# Patient Record
Sex: Female | Born: 1965 | Race: White | Hispanic: No | Marital: Married | Smoking: Former smoker
Health system: Southern US, Community
[De-identification: ages and names within clinical notes are randomized; demographics above are authoritative.]

## PROBLEM LIST (undated history)

## (undated) DIAGNOSIS — I10 Essential (primary) hypertension: Secondary | ICD-10-CM

## (undated) DIAGNOSIS — F319 Bipolar disorder, unspecified: Secondary | ICD-10-CM

## (undated) DIAGNOSIS — K635 Polyp of colon: Secondary | ICD-10-CM

## (undated) DIAGNOSIS — F329 Major depressive disorder, single episode, unspecified: Secondary | ICD-10-CM

## (undated) DIAGNOSIS — K219 Gastro-esophageal reflux disease without esophagitis: Secondary | ICD-10-CM

## (undated) DIAGNOSIS — D649 Anemia, unspecified: Secondary | ICD-10-CM

## (undated) DIAGNOSIS — F32A Depression, unspecified: Secondary | ICD-10-CM

## (undated) DIAGNOSIS — M1990 Unspecified osteoarthritis, unspecified site: Secondary | ICD-10-CM

## (undated) DIAGNOSIS — G4730 Sleep apnea, unspecified: Secondary | ICD-10-CM

## (undated) HISTORY — DX: Polyp of colon: K63.5

## (undated) HISTORY — DX: Major depressive disorder, single episode, unspecified: F32.9

## (undated) HISTORY — DX: Sleep apnea, unspecified: G47.30

## (undated) HISTORY — DX: Bipolar disorder, unspecified: F31.9

## (undated) HISTORY — DX: Gastro-esophageal reflux disease without esophagitis: K21.9

## (undated) HISTORY — DX: Essential (primary) hypertension: I10

## (undated) HISTORY — DX: Anemia, unspecified: D64.9

## (undated) HISTORY — DX: Morbid (severe) obesity due to excess calories: E66.01

## (undated) HISTORY — DX: Depression, unspecified: F32.A

## (undated) HISTORY — DX: Unspecified osteoarthritis, unspecified site: M19.90

---

## 1998-07-12 ENCOUNTER — Other Ambulatory Visit: Admission: RE | Admit: 1998-07-12 | Discharge: 1998-07-12 | Payer: Self-pay | Admitting: Gynecology

## 1998-07-27 ENCOUNTER — Encounter: Admission: RE | Admit: 1998-07-27 | Discharge: 1998-10-25 | Payer: Self-pay | Admitting: Gynecology

## 1998-11-08 ENCOUNTER — Encounter: Admission: RE | Admit: 1998-11-08 | Discharge: 1999-02-06 | Payer: Self-pay | Admitting: Gynecology

## 1998-12-05 ENCOUNTER — Inpatient Hospital Stay (HOSPITAL_COMMUNITY): Admission: AD | Admit: 1998-12-05 | Discharge: 1998-12-05 | Payer: Self-pay | Admitting: Obstetrics and Gynecology

## 1998-12-06 ENCOUNTER — Inpatient Hospital Stay (HOSPITAL_COMMUNITY): Admission: AD | Admit: 1998-12-06 | Discharge: 1998-12-06 | Payer: Self-pay | Admitting: Gynecology

## 1999-01-21 ENCOUNTER — Inpatient Hospital Stay (HOSPITAL_COMMUNITY): Admission: AD | Admit: 1999-01-21 | Discharge: 1999-01-21 | Payer: Self-pay | Admitting: Obstetrics and Gynecology

## 1999-01-25 ENCOUNTER — Inpatient Hospital Stay (HOSPITAL_COMMUNITY): Admission: AD | Admit: 1999-01-25 | Discharge: 1999-01-29 | Payer: Self-pay | Admitting: Gynecology

## 2000-05-03 ENCOUNTER — Other Ambulatory Visit: Admission: RE | Admit: 2000-05-03 | Discharge: 2000-05-03 | Payer: Self-pay | Admitting: Obstetrics and Gynecology

## 2000-05-10 ENCOUNTER — Encounter: Payer: Self-pay | Admitting: Family Medicine

## 2000-05-10 ENCOUNTER — Ambulatory Visit (HOSPITAL_COMMUNITY): Admission: RE | Admit: 2000-05-10 | Discharge: 2000-05-10 | Payer: Self-pay | Admitting: Family Medicine

## 2000-11-16 ENCOUNTER — Emergency Department (HOSPITAL_COMMUNITY): Admission: EM | Admit: 2000-11-16 | Discharge: 2000-11-17 | Payer: Self-pay | Admitting: Emergency Medicine

## 2001-07-08 ENCOUNTER — Other Ambulatory Visit: Admission: RE | Admit: 2001-07-08 | Discharge: 2001-07-08 | Payer: Self-pay | Admitting: Obstetrics and Gynecology

## 2001-12-26 ENCOUNTER — Encounter: Payer: Self-pay | Admitting: Obstetrics and Gynecology

## 2001-12-26 ENCOUNTER — Ambulatory Visit (HOSPITAL_COMMUNITY): Admission: RE | Admit: 2001-12-26 | Discharge: 2001-12-26 | Payer: Self-pay | Admitting: Obstetrics and Gynecology

## 2002-08-02 ENCOUNTER — Inpatient Hospital Stay (HOSPITAL_COMMUNITY): Admission: AD | Admit: 2002-08-02 | Discharge: 2002-08-02 | Payer: Self-pay | Admitting: Gynecology

## 2002-08-04 ENCOUNTER — Encounter: Admission: RE | Admit: 2002-08-04 | Discharge: 2002-08-04 | Payer: Self-pay | Admitting: Gynecology

## 2002-08-13 ENCOUNTER — Observation Stay (HOSPITAL_COMMUNITY): Admission: AD | Admit: 2002-08-13 | Discharge: 2002-08-14 | Payer: Self-pay | Admitting: *Deleted

## 2002-09-30 ENCOUNTER — Inpatient Hospital Stay (HOSPITAL_COMMUNITY): Admission: AD | Admit: 2002-09-30 | Discharge: 2002-10-04 | Payer: Self-pay | Admitting: Gynecology

## 2002-09-30 ENCOUNTER — Encounter (INDEPENDENT_AMBULATORY_CARE_PROVIDER_SITE_OTHER): Payer: Self-pay

## 2002-10-05 ENCOUNTER — Encounter: Admission: RE | Admit: 2002-10-05 | Discharge: 2002-11-04 | Payer: Self-pay | Admitting: Gynecology

## 2002-10-15 ENCOUNTER — Ambulatory Visit (HOSPITAL_COMMUNITY): Admission: RE | Admit: 2002-10-15 | Discharge: 2002-10-15 | Payer: Self-pay | Admitting: Gynecology

## 2002-11-12 ENCOUNTER — Other Ambulatory Visit: Admission: RE | Admit: 2002-11-12 | Discharge: 2002-11-12 | Payer: Self-pay | Admitting: Gynecology

## 2002-12-05 ENCOUNTER — Encounter: Admission: RE | Admit: 2002-12-05 | Discharge: 2003-01-04 | Payer: Self-pay | Admitting: Gynecology

## 2003-01-05 ENCOUNTER — Encounter: Admission: RE | Admit: 2003-01-05 | Discharge: 2003-02-04 | Payer: Self-pay | Admitting: Gynecology

## 2003-05-17 ENCOUNTER — Encounter: Admission: RE | Admit: 2003-05-17 | Discharge: 2003-08-15 | Payer: Self-pay | Admitting: Internal Medicine

## 2004-01-19 ENCOUNTER — Other Ambulatory Visit: Admission: RE | Admit: 2004-01-19 | Discharge: 2004-01-19 | Payer: Self-pay | Admitting: Gynecology

## 2004-01-31 ENCOUNTER — Ambulatory Visit (HOSPITAL_COMMUNITY): Admission: RE | Admit: 2004-01-31 | Discharge: 2004-01-31 | Payer: Self-pay | Admitting: Gynecology

## 2004-09-29 ENCOUNTER — Ambulatory Visit: Payer: Self-pay | Admitting: Gastroenterology

## 2004-10-04 ENCOUNTER — Ambulatory Visit: Payer: Self-pay | Admitting: Gastroenterology

## 2004-10-13 ENCOUNTER — Ambulatory Visit: Payer: Self-pay | Admitting: Gastroenterology

## 2005-02-12 ENCOUNTER — Other Ambulatory Visit: Admission: RE | Admit: 2005-02-12 | Discharge: 2005-02-12 | Payer: Self-pay | Admitting: Gynecology

## 2005-02-13 ENCOUNTER — Encounter (INDEPENDENT_AMBULATORY_CARE_PROVIDER_SITE_OTHER): Payer: Self-pay | Admitting: *Deleted

## 2005-02-13 ENCOUNTER — Ambulatory Visit (HOSPITAL_COMMUNITY): Admission: RE | Admit: 2005-02-13 | Discharge: 2005-02-13 | Payer: Self-pay | Admitting: General Surgery

## 2005-02-13 ENCOUNTER — Ambulatory Visit (HOSPITAL_BASED_OUTPATIENT_CLINIC_OR_DEPARTMENT_OTHER): Admission: RE | Admit: 2005-02-13 | Discharge: 2005-02-13 | Payer: Self-pay | Admitting: General Surgery

## 2005-02-28 ENCOUNTER — Ambulatory Visit (HOSPITAL_COMMUNITY): Admission: RE | Admit: 2005-02-28 | Discharge: 2005-02-28 | Payer: Self-pay | Admitting: Gynecology

## 2006-04-23 ENCOUNTER — Other Ambulatory Visit: Admission: RE | Admit: 2006-04-23 | Discharge: 2006-04-23 | Payer: Self-pay | Admitting: Gynecology

## 2006-05-15 ENCOUNTER — Ambulatory Visit (HOSPITAL_COMMUNITY): Admission: RE | Admit: 2006-05-15 | Discharge: 2006-05-15 | Payer: Self-pay | Admitting: Gynecology

## 2007-07-08 ENCOUNTER — Ambulatory Visit: Payer: Self-pay | Admitting: Gastroenterology

## 2007-07-08 LAB — CONVERTED CEMR LAB
ALT: 26 units/L (ref 0–35)
AST: 24 units/L (ref 0–37)
Albumin: 3.6 g/dL (ref 3.5–5.2)
Alkaline Phosphatase: 63 units/L (ref 39–117)
BUN: 10 mg/dL (ref 6–23)
Basophils Absolute: 0.1 10*3/uL (ref 0.0–0.1)
Basophils Relative: 0.8 % (ref 0.0–1.0)
Bilirubin, Direct: 0.1 mg/dL (ref 0.0–0.3)
CO2: 29 meq/L (ref 19–32)
Calcium: 9.2 mg/dL (ref 8.4–10.5)
Chloride: 106 meq/L (ref 96–112)
Creatinine, Ser: 0.7 mg/dL (ref 0.4–1.2)
Eosinophils Absolute: 0.4 10*3/uL (ref 0.0–0.6)
Eosinophils Relative: 3.6 % (ref 0.0–5.0)
Ferritin: 19.1 ng/mL (ref 10.0–291.0)
Folate: 13.9 ng/mL
GFR calc Af Amer: 118 mL/min
GFR calc non Af Amer: 98 mL/min
Glucose, Bld: 90 mg/dL (ref 70–99)
HCT: 36.4 % (ref 36.0–46.0)
Hemoglobin: 12.6 g/dL (ref 12.0–15.0)
Iron: 52 ug/dL (ref 42–145)
Lymphocytes Relative: 18.5 % (ref 12.0–46.0)
MCHC: 34.6 g/dL (ref 30.0–36.0)
MCV: 87.6 fL (ref 78.0–100.0)
Monocytes Absolute: 1.2 10*3/uL — ABNORMAL HIGH (ref 0.2–0.7)
Monocytes Relative: 10.8 % (ref 3.0–11.0)
Neutro Abs: 7.1 10*3/uL (ref 1.4–7.7)
Neutrophils Relative %: 66.3 % (ref 43.0–77.0)
Platelets: 333 10*3/uL (ref 150–400)
Potassium: 4.1 meq/L (ref 3.5–5.1)
RBC: 4.15 M/uL (ref 3.87–5.11)
RDW: 13.2 % (ref 11.5–14.6)
Saturation Ratios: 12.9 % — ABNORMAL LOW (ref 20.0–50.0)
Sodium: 139 meq/L (ref 135–145)
TSH: 5.08 microintl units/mL (ref 0.35–5.50)
Total Bilirubin: 0.5 mg/dL (ref 0.3–1.2)
Total Protein: 6.8 g/dL (ref 6.0–8.3)
Transferrin: 287.9 mg/dL (ref >=212.0)
Vitamin B-12: 387 pg/mL (ref 211–911)
WBC: 10.8 10*3/uL — ABNORMAL HIGH (ref 4.5–10.5)

## 2007-08-06 ENCOUNTER — Ambulatory Visit: Payer: Self-pay | Admitting: Gastroenterology

## 2007-09-24 ENCOUNTER — Other Ambulatory Visit: Admission: RE | Admit: 2007-09-24 | Discharge: 2007-09-24 | Payer: Self-pay | Admitting: Gynecology

## 2007-10-16 ENCOUNTER — Ambulatory Visit (HOSPITAL_COMMUNITY): Admission: RE | Admit: 2007-10-16 | Discharge: 2007-10-16 | Payer: Self-pay | Admitting: Gynecology

## 2008-03-01 DIAGNOSIS — K649 Unspecified hemorrhoids: Secondary | ICD-10-CM | POA: Insufficient documentation

## 2008-03-01 DIAGNOSIS — F329 Major depressive disorder, single episode, unspecified: Secondary | ICD-10-CM | POA: Insufficient documentation

## 2008-03-01 DIAGNOSIS — D126 Benign neoplasm of colon, unspecified: Secondary | ICD-10-CM | POA: Insufficient documentation

## 2008-10-25 ENCOUNTER — Ambulatory Visit (HOSPITAL_COMMUNITY): Admission: RE | Admit: 2008-10-25 | Discharge: 2008-10-25 | Payer: Self-pay | Admitting: Gynecology

## 2008-10-26 ENCOUNTER — Other Ambulatory Visit: Admission: RE | Admit: 2008-10-26 | Discharge: 2008-10-26 | Payer: Self-pay | Admitting: Gynecology

## 2008-10-26 ENCOUNTER — Encounter: Payer: Self-pay | Admitting: Gynecology

## 2008-10-26 ENCOUNTER — Ambulatory Visit: Payer: Self-pay | Admitting: Gynecology

## 2009-05-05 ENCOUNTER — Emergency Department (HOSPITAL_COMMUNITY): Admission: EM | Admit: 2009-05-05 | Discharge: 2009-05-05 | Payer: Self-pay | Admitting: Emergency Medicine

## 2009-06-08 ENCOUNTER — Ambulatory Visit: Payer: Self-pay | Admitting: Gynecology

## 2009-08-18 ENCOUNTER — Ambulatory Visit: Payer: Self-pay | Admitting: Gynecology

## 2009-08-18 ENCOUNTER — Encounter: Payer: Self-pay | Admitting: Gynecology

## 2009-08-24 ENCOUNTER — Ambulatory Visit: Payer: Self-pay | Admitting: Gynecology

## 2009-08-25 ENCOUNTER — Ambulatory Visit: Payer: Self-pay | Admitting: Gynecology

## 2009-08-25 HISTORY — PX: ENDOMETRIAL ABLATION: SHX621

## 2009-09-09 ENCOUNTER — Ambulatory Visit: Payer: Self-pay | Admitting: Gynecology

## 2010-01-02 ENCOUNTER — Other Ambulatory Visit: Admission: RE | Admit: 2010-01-02 | Discharge: 2010-01-02 | Payer: Self-pay | Admitting: Gynecology

## 2010-01-02 ENCOUNTER — Ambulatory Visit: Payer: Self-pay | Admitting: Gynecology

## 2010-03-21 ENCOUNTER — Ambulatory Visit: Payer: Self-pay | Admitting: Gynecology

## 2010-03-21 ENCOUNTER — Inpatient Hospital Stay (HOSPITAL_COMMUNITY): Admission: RE | Admit: 2010-03-21 | Discharge: 2010-03-23 | Payer: Self-pay | Admitting: Gynecology

## 2010-03-21 ENCOUNTER — Encounter: Payer: Self-pay | Admitting: Gynecology

## 2010-03-21 HISTORY — PX: ABDOMINAL HYSTERECTOMY: SHX81

## 2010-03-27 ENCOUNTER — Ambulatory Visit: Payer: Self-pay | Admitting: Gynecology

## 2010-04-04 ENCOUNTER — Ambulatory Visit: Payer: Self-pay | Admitting: Gynecology

## 2010-05-03 ENCOUNTER — Ambulatory Visit: Payer: Self-pay | Admitting: Gynecology

## 2010-09-11 ENCOUNTER — Ambulatory Visit (HOSPITAL_COMMUNITY): Admission: RE | Admit: 2010-09-11 | Discharge: 2010-09-11 | Payer: Self-pay | Admitting: Gynecology

## 2010-12-26 NOTE — Procedures (Signed)
Summary: Gastroenterology colon  Gastroenterology colon   Imported By: Donneta Romberg 03/02/2008 14:38:49  _____________________________________________________________________  External Attachment:    Type:   Image     Comment:   External Document

## 2011-02-13 LAB — CBC
HCT: 32.3 % — ABNORMAL LOW (ref 36.0–46.0)
HCT: 37.3 % (ref 36.0–46.0)
Hemoglobin: 10.5 g/dL — ABNORMAL LOW (ref 12.0–15.0)
Hemoglobin: 12 g/dL (ref 12.0–15.0)
MCHC: 32.1 g/dL (ref 30.0–36.0)
MCHC: 32.5 g/dL (ref 30.0–36.0)
MCV: 74 fL — ABNORMAL LOW (ref 78.0–100.0)
MCV: 75.1 fL — ABNORMAL LOW (ref 78.0–100.0)
Platelets: 309 10*3/uL (ref 150–400)
Platelets: 342 10*3/uL (ref 150–400)
RBC: 4.3 MIL/uL (ref 3.87–5.11)
RBC: 5.03 MIL/uL (ref 3.87–5.11)
RDW: 17.9 % — ABNORMAL HIGH (ref 11.5–15.5)
RDW: 18 % — ABNORMAL HIGH (ref 11.5–15.5)
WBC: 14.8 10*3/uL — ABNORMAL HIGH (ref 4.0–10.5)
WBC: 8.4 10*3/uL (ref 4.0–10.5)

## 2011-02-13 LAB — URINALYSIS, ROUTINE W REFLEX MICROSCOPIC
Bilirubin Urine: NEGATIVE
Glucose, UA: NEGATIVE mg/dL
Hgb urine dipstick: NEGATIVE
Ketones, ur: NEGATIVE mg/dL
Nitrite: NEGATIVE
Protein, ur: NEGATIVE mg/dL
Specific Gravity, Urine: >1.03 — ABNORMAL HIGH (ref 1.005–1.030)
Urobilinogen, UA: 0.2 mg/dL (ref 0.0–1.0)
pH: 5 (ref 5.0–8.0)

## 2011-02-13 LAB — PREGNANCY, URINE: Preg Test, Ur: NEGATIVE

## 2011-03-05 LAB — COMPREHENSIVE METABOLIC PANEL
ALT: 22 U/L (ref 0–35)
AST: 27 U/L (ref 0–37)
Albumin: 3.4 g/dL — ABNORMAL LOW (ref 3.5–5.2)
Alkaline Phosphatase: 86 U/L (ref 39–117)
BUN: 7 mg/dL (ref 6–23)
CO2: 25 mEq/L (ref 19–32)
Calcium: 9 mg/dL (ref 8.4–10.5)
Chloride: 106 mEq/L (ref 96–112)
Creatinine, Ser: 0.7 mg/dL (ref 0.4–1.2)
GFR calc Af Amer: >60 mL/min (ref >60)
GFR calc non Af Amer: >60 mL/min (ref >60)
Glucose, Bld: 117 mg/dL — ABNORMAL HIGH (ref 70–99)
Potassium: 3.3 mEq/L — ABNORMAL LOW (ref 3.5–5.1)
Sodium: 137 mEq/L (ref 135–145)
Total Bilirubin: 0.4 mg/dL (ref 0.3–1.2)
Total Protein: 6.7 g/dL (ref 6.0–8.3)

## 2011-03-05 LAB — CBC
HCT: 30.5 % — ABNORMAL LOW (ref 36.0–46.0)
Hemoglobin: 9.6 g/dL — ABNORMAL LOW (ref 12.0–15.0)
MCHC: 31.4 g/dL (ref 30.0–36.0)
MCV: 69 fL — ABNORMAL LOW (ref 78.0–100.0)
Platelets: 347 10*3/uL (ref 150–400)
RBC: 4.41 MIL/uL (ref 3.87–5.11)
RDW: 17.4 % — ABNORMAL HIGH (ref 11.5–15.5)
WBC: 10.6 10*3/uL — ABNORMAL HIGH (ref 4.0–10.5)

## 2011-03-05 LAB — DIFFERENTIAL
Basophils Absolute: 0.1 10*3/uL (ref 0.0–0.1)
Basophils Relative: 1 % (ref 0–1)
Eosinophils Absolute: 0.2 10*3/uL (ref 0.0–0.7)
Eosinophils Relative: 2 % (ref 0–5)
Lymphocytes Relative: 20 % (ref 12–46)
Lymphs Abs: 2.1 10*3/uL (ref 0.7–4.0)
Monocytes Absolute: 0.8 10*3/uL (ref 0.1–1.0)
Monocytes Relative: 8 % (ref 3–12)
Neutro Abs: 7.4 10*3/uL (ref 1.7–7.7)
Neutrophils Relative %: 70 % (ref 43–77)

## 2011-03-05 LAB — POCT CARDIAC MARKERS
CKMB, poc: 1.1 ng/mL (ref 1.0–8.0)
Myoglobin, poc: 54.9 ng/mL (ref 12–200)
Troponin i, poc: <0.05 ng/mL (ref 0.00–0.09)

## 2011-03-05 LAB — LIPASE, BLOOD: Lipase: 29 U/L (ref 11–59)

## 2011-04-10 NOTE — Assessment & Plan Note (Signed)
[facility]                         GASTROENTEROLOGY OFFICE NOTE   NAME:Sinquefield, KLOI BRODMAN                       MRN:          [id number]  DATE:07/08/2007                            DOB:          [birth year]    Anayiah has had some asymptomatic rectal bleeding on and off for the  last 2 weeks.  She really denies any other GI complaints.  She has had  rather severe depression over the last year with corresponds with a very  sick father who has severe depression in [place].  She is actually  under the care of Dr. Juanito Doom at [facility] and has had ECT therapy,  is currently on Lithium 600 mg twice a day and Wellbutrin 150 mg a day.  She has been coming off of Effexor over the last month.   Her last colonoscopy was in 2005 which was unremarkable except for some  hyperplastic colon polyps.  At that time she was having some  asymptomatic rectal bleeding.  She does have a grandfather that had  colon cancer in his 65s.   She weighs 210 pounds and blood pressure of 140/92.  Pulse was 84 and  regular.  She had no stigmata of chronic liver disease and appeared  healthy and in no acute distress.  ABDOMINAL:  Exam was entirely normal.  Inspection of the rectum did show posterior skin tag but no fissures or  fistulae.  RECTAL:  Exam showed no masses or tenderness with soft stool present  that was +1 guaiac positive.   ASSESSMENT:  1. Sereniti has had some asymptomatic rectal bleeding which may be from      local anal disease but she does have a positive stool today which      is of some concern.  2. Severe depression with possible bipolar disorder.   RECOMMENDATIONS:  1. P.r.n. local anal care and cream.  2. Check lab followup.  3. Outpatient colonoscopy at her convenience.  4. Continue psych medicines mentioned above.     Vania Rea. Jarold Motto, MD, Caleen Essex, FAGA  Electronically Signed    DRP/MedQ  DD: 07/08/2007  DT: 07/09/2007  Job #:  [id number]   cc:   Dr. Juanito Doom

## 2011-04-13 NOTE — Op Note (Signed)
Alexis Wall, Alexis Wall                          ACCOUNT NO.:  [id number]   MEDICAL RECORD NO.:  [id number]                   PATIENT TYPE:  OBV   LOCATION:  9158                                 FACILITY:  [facility]   PHYSICIAN:  Juan H. Lily Peer, M.D.             DATE OF BIRTH:  03/25/1966   DATE OF PROCEDURE:  09/30/2002  DATE OF DISCHARGE:  08/14/2002                                 OPERATIVE REPORT   PREOPERATIVE DIAGNOSES:  1. Term intrauterine pregnancy at 84 weeks' estimated gestational age.  2. Previous cesarean section, for repeat.  3. Labor.  4. Gestational diabetic on insulin.  5. Request for elective permanent sterilization.   POSTOPERATIVE DIAGNOSES:  1. Term intrauterine pregnancy at 57 weeks' gestation estimated gestational     age.  2. Previous cesarean section, for repeat.  3. Labor.  4. Gestational diabetic on insulin.  5. Request for elective permanent sterilization.   PROCEDURE:  1. Repeat lower uterine segment transverse cesarean section.  2. Bilateral tubal sterilization procedure Pomeroy technique.   SURGEON:  Juan H. Lily Peer, M.D.   FINDINGS:  Viable female infant, Apgars of 8 and 9, nuchal cord x1.  Clear  amniotic fluid.  Arterial cord pH was 7.27. Normal fallopian tubes and  ovaries.  A small 1 x 2 cm subserosal leiomyoma was noted, wide based.   INDICATIONS:  This is a 45 year old gravida 3, para 1, AB1 at 79 weeks'  gestation with a history of previous cesarean section who was scheduled for  elective repeat cesarean section in a couple of weeks.  She presented to the  office in labor.  The patient has a history of gestational diabetes  controlled with insulin. She also requested for elective permanent  sterilization.   DESCRIPTION OF PROCEDURE:  After the patient was adequately counseled, she  was taken to the operating room where she underwent a successful spinal  placement.  Her abdomen was prepped and draped in the usual sterile fashion.  A  Foley catheter was inserted.  Fetal heart tones were appreciated before  commencing with the cesarean section.  Once the prep was completed and  drapes were placed, and Foley catheter replaced, the abdomen was marked with  the marking pin adjacent to the previous uterine scar.  The incision was  made in this area which was 2 cm above the symphysis pubis.  The incision  was carried down through the skin and subcutaneous tissue down to the rectus  fascia where a midline nick was made.  The fascia was incised in a  transverse fashion.  The midline raphe was entered.  The peritoneal cavity  was entered cautiously.  The bladder flap was established.  The lower  uterine segment was incised.  The newborn's head was delivered with the  assistance of a vacuum.  A nuchal cord was manually reduced and the newborn  was delivered and gave an immediate after  nasopharyngeal air was bulb  suctioned.  The cord was doubly clamped and excised.  The newborn was shown  to the parents and passed off to the pediatrician who gave the above  mentioned parameters.  After the appropriate cord blood was obtained,  Pitocin drip was started.  The placenta was delivered from the intrauterine  cavity.  The uterus was then exteriorized, and the remaining of products of  conception were removed.  The transverse incision was reapproximated with a  running stitch of 0 Vicryl suture.  Attention was then placed into the  proximal one-third portion of the right fallopian tube where she was placed  under traction with a Babcock clamp and a 2 cm segment was then secured with  3-0 Vicryl suture x2, and a 2 cm segment was excised and passed off the  operative field for histological evaluation.  The remaining stumps were  Bovie cauterized.  On the contralateral tube, a similar procedure and  technique was utilized. The uterus was gently placed b back into the pelvic  cavity.  The Foley catheter was copiously irrigated with normal  saline  solution.  After inspection of the lower uterine segment as well as the  fallopian tube and ascertaining adequate hemostasis, the rectus fascia was  then closed with the running stitch of 0 Vicryl suture.  The visceral  peritoneum was not closed.  The subcutaneous bleeders were Bovie cauterized.  The skin was reapproximated with skin clips followed by placing Xeroform  gauze and 4 x 8 dressing.  The patient was transferred to the recovery room  with stable vital signs.  Blood loss for the procedure was 500 cc.  IV fluid  was 2500 cc of lactated Ringers and urine output was 300 cc.                                                Juan H. Lily Peer, M.D.    JHF/MEDQ  D:  09/30/2002  T:  10/01/2002  Job:  [id number]

## 2011-04-13 NOTE — H&P (Signed)
Alexis Wall, Alexis Wall                          ACCOUNT NO.:  [id number]   MEDICAL RECORD NO.:  [id number]                   PATIENT TYPE:   LOCATION:                                       [facility]ACILITY:  [facility]   PHYSICIAN:  Juan H. Lily Peer, M.D.             DATE OF BIRTH:  01/17/1966   DATE OF ADMISSION:  DATE OF DISCHARGE:                                HISTORY & PHYSICAL   CHIEF COMPLAINT:  1. Previous cesarean section scheduled for elective repeat on November 18th,     currently in labor.  2. Thirty-seven week gestation.  3. History of gestational diabetes on insulin.  4. Advanced maternal age.  5. Request for immediate tubal sterilization procedure.   HISTORY:  The patient is a 45 year old gravida 3, para 1, AB 1 with a  corrected estimated date of confinement of October 21, 2002, currently at  [redacted] weeks gestation.  A patient with previous pregnancy gestational diabetes  , had been on insulin, had a hemoglobin A-1-C early in this pregnancy and it  was normal.  She also, due to advanced maternal age,  had a genetic amnio,  which had a 60 xx chromosome developing fetus.  She developed premature  contractions this pregnancy and had been on terbutaline, and her last  terbutaline of 2.5 mg p.o. was at 1 o'clock today.  She also had gestational  diabetes and had been on a diet and subsequently on insulin.  Her last  insulin regimen was 12 units of NPH before bedtime.  Her fasting blood sugar  this morning was 83 and her two-hour postprandial was 90.  She began  complaining of contractions at approximately 1 P.M. this afternoon,  presenting to the office.  She was placed on the monitor and she was  contracting every one to three minutes apart with a reassuring fetal heart  rate tracing, but her cervix was long, closed and posterior, and she was  quite uncomfortable.  She also had been counselled for tubal sterilization  procedure at the time of her repeat cesarean section.  Risks,  benefits, pros  and cons as well as failure rates and complications had previously been  discussed.   PAST MEDICAL HISTORY:  In 1998 the patient had a spontaneous AB.  In the  year 2000 had a C-section secondary to failure to progress of an 8 pound 11  ounce baby.  She is allergic to PENICILLIN and she had a positive group B  Strep culture last pregnancy.  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   REVIEW OF SYSTEMS:  See Hollister form.   PHYSICAL EXAMINATION:   VITAL SIGNS:  The patient's blood pressure today is 120/72.  Urine was  negative for protein and glucose.  Weight is 212 pounds.   HEENT:  Unremarkable.   NECK:  Supple.  Trachea midline.  No carotid bruits.  No thyromegaly.  LUNGS:  Clear to auscultation without rhonchi or wheezes.   HEART:  Regular rate and rhythm.  No murmurs or gallops.   BREAST EXAMINATION:  Not done.   ABDOMEN:  Gravid uterus, vertex presentation, 37 cm fundal height.  Cervix  long, closed and posterior.   EXTREMITIES:  DTRs 1+.  Negative clonus.   PRENATAL LABORATORY DATA:  O positive blood type, negative antibody screen.  VDRL was nonreactive.  Hepatitis B surface antigen and HIV were negative.  Rubella titer with evidence of immunity.  Diabetic screen abnormal as  described above and patient also had iron-deficiency anemia for which she  was on supplemental iron; and, she had genetic amnio at [redacted] weeks gestation  of 25 xx of normal developing fetus chromosomally.   ASSESSMENT:  [age]-year-old gravida 3, para 1 AB 1 at [redacted] weeks  gestation, previous cesarean section, in labor who requested for elective  repeat as well as request for bilateral tubal sterilization procedure.  The  patient has gestational diabetes on insulin.  Last insulin dose was last  night in which she received 12 units of NPH.  Her fasting blood sugar this  morning was 83 and two-hour postprandial was 90.   Risks, benefits, pros and cons of repeat cesarean  section versus trial of  labor as well as for tubal sterilization failure rates were discussed with  the patient.  They feel comfortable proceeding with such mentioned  procedures.  All questions were answered and we will follow according to  plan.  The patient is scheduled for repeat cesarean section and bilateral  tubal sterilization procedure this evening.                                                  Juan H. Lily Peer, M.D.    JHF/MEDQ  D:  09/30/2002  T:  09/30/2002  Job:  [id number]

## 2011-04-13 NOTE — Op Note (Signed)
[name], [name]                ACCOUNT NO.:  [id number]   MEDICAL RECORD NO.:  [id number]          PATIENT TYPE:  AMB   LOCATION:  DSC                          FACILITY:  [facility]   PHYSICIAN:  Anselm Pancoast. Weatherly, M.D.DATE OF BIRTH:  [birth year]   DATE OF PROCEDURE:  02/13/2005  DATE OF DISCHARGE:                                 OPERATIVE REPORT   PREOPERATIVE DIAGNOSIS:  Previously inflamed sebaceous cyst, back, about a 3  x 2 cm.   POSTOPERATIVE DIAGNOSIS:  Previously inflamed sebaceous cyst, back, about a  3 x 2 cm.   OPERATION PERFORMED:  Excision of a 2 cm sebaceous cyst with primary  closure.   SURGEON:  Anselm Pancoast. Zachery Dakins, M.D.   ANESTHESIA:  Local.   HISTORY:  Alexis Wall is a 46 year old female who has had an inflamed  epidermoid of her back.  I saw her in the office about two weeks ago and at  that time she had been on Septra.  She is allergic to penicillin. I advised  her to let's plan on excising this with local anesthesia and did not  actually drain it since the acute inflammation was definitely decreasing.  She did take one of the remaining Septra last evening and on exam today, the  area is smaller than it was when I saw her in the office.  I prepped the  area with Betadine solution and then anesthetized the area with Xylocaine  with Adrenalin.  About 6 cc was used and I kind of circled the little area.  The skin pore area was ellipsed out and the underlying cyst separated from  the fatty tissue.  Several little bleeders were lightly sutured with 3-0  chromic for hemostasis and then the skin was closed with interrupted 4-0  nylon mattress sutures.  I instructed her to limit her activity for this  afternoon.  She can kind of resume normal activity tomorrow.  I am going to  keep her on Septra for about three days.  If she is having increased pain, I  want to see her in the office on Friday as we may need to open the incision  but if there is no inflammation or  increase in pain, we will see her back in  the office next week to remove the stitches and Steri-Strip the incision.  She was given some Vicodin if needed, but I think Aleve over-the-counter  pain medication should be adequate.      WJW/MEDQ  D:  02/13/2005  T:  02/13/2005  Job:  [id number]

## 2011-04-13 NOTE — Discharge Summary (Signed)
   NAMEJAZMINN, POMALES                            ACCOUNT NO.:  [id number]   MEDICAL RECORD NO.:  [id number]                   PATIENT TYPE:   LOCATION:                                       [facility]   PHYSICIAN:  Ivor Costa. Farrel Gobble, M.D.              DATE OF BIRTH:   DATE OF ADMISSION:  09/30/2002  DATE OF DISCHARGE:  10/05/2002                                 DISCHARGE SUMMARY   DISCHARGE DIAGNOSES:  1. Intrauterine pregnancy 37 weeks.  2. Previous cesarean section for repeat.  3. Labor.  4. Gestational diabetic on insulin.  5. Request for permanent sterilization.   PROCEDURE:  1. Repeat low cervical transverse cesarean section.  2. Bilateral tubal sterilization Pomeroy technique.   HISTORY OF PRESENT ILLNESS:  The patient is a 45 year old gravida 3, para 1-  0-1-1 with an LMP of January 14, 2002, [facility] October 21, 2002.  Prenatal  course was complicated by gestational diabetes, previous cesarean section,  history of preterm labor.  Laboratories:  Blood type O+.  Antibody screen  negative.  RPR, HBSAG, HIV nonreactive.   HOSPITAL COURSE:  The patient was admitted for repeat low cervical  transverse cesarean section, bilateral tubal sterilization.  Procedure was  performed by Dr. Lily Peer.  Findings included viable female infant, Apgars  8 and 9.  Nuchal cord x1.  Clear amniotic fluid.  Normal fallopian tubes and  ovaries.  Small 1 x 2 cm subserosal leiomyoma was noted.  Postpartum course  patient remained afebrile.  Had no difficulty voiding.  Was able to be  discharged on the third postoperative day in satisfactory condition.  CBC:  Hematocrit 30.3, hemoglobin 7, WBC 11, platelets 245,000.   DISPOSITION:  Follow up in six weeks.  Continue prenatal vitamins and iron.  Motrin and Tylox for pain.     Elwyn Lade . Hancock, N.P.                Ivor Costa. Farrel Gobble, M.D.    MKH/MEDQ  D:  10/27/2002  T:  10/27/2002  Job:  [id number]

## 2011-12-03 ENCOUNTER — Other Ambulatory Visit: Payer: Self-pay | Admitting: Gynecology

## 2011-12-03 DIAGNOSIS — Z1231 Encounter for screening mammogram for malignant neoplasm of breast: Secondary | ICD-10-CM

## 2011-12-04 ENCOUNTER — Ambulatory Visit (HOSPITAL_COMMUNITY): Payer: Self-pay

## 2011-12-19 ENCOUNTER — Encounter: Payer: Self-pay | Admitting: Gynecology

## 2011-12-31 ENCOUNTER — Ambulatory Visit (HOSPITAL_COMMUNITY)
Admission: RE | Admit: 2011-12-31 | Discharge: 2011-12-31 | Disposition: A | Discharge status: Home / Self Care | Payer: BC Managed Care – PPO | Source: Ambulatory Visit | Attending: Gynecology | Admitting: Gynecology

## 2011-12-31 DIAGNOSIS — Z1231 Encounter for screening mammogram for malignant neoplasm of breast: Secondary | ICD-10-CM | POA: Insufficient documentation

## 2012-01-08 ENCOUNTER — Other Ambulatory Visit: Payer: Self-pay | Admitting: Gynecology

## 2012-01-08 DIAGNOSIS — R928 Other abnormal and inconclusive findings on diagnostic imaging of breast: Secondary | ICD-10-CM

## 2012-01-09 ENCOUNTER — Other Ambulatory Visit: Payer: Self-pay | Admitting: *Deleted

## 2012-01-09 ENCOUNTER — Encounter: Payer: Self-pay | Admitting: Gynecology

## 2012-01-09 ENCOUNTER — Ambulatory Visit (INDEPENDENT_AMBULATORY_CARE_PROVIDER_SITE_OTHER): Payer: BC Managed Care – PPO | Admitting: Gynecology

## 2012-01-09 VITALS — BP 134/88 | Ht 63.5 in | Wt 239.0 lb

## 2012-01-09 DIAGNOSIS — Z01419 Encounter for gynecological examination (general) (routine) without abnormal findings: Secondary | ICD-10-CM

## 2012-01-09 DIAGNOSIS — Z1211 Encounter for screening for malignant neoplasm of colon: Secondary | ICD-10-CM

## 2012-01-09 DIAGNOSIS — L293 Anogenital pruritus, unspecified: Secondary | ICD-10-CM

## 2012-01-09 DIAGNOSIS — L292 Pruritus vulvae: Secondary | ICD-10-CM

## 2012-01-09 DIAGNOSIS — N63 Unspecified lump in unspecified breast: Secondary | ICD-10-CM

## 2012-01-09 LAB — WET PREP FOR TRICH, YEAST, CLUE
Clue Cells Wet Prep HPF POC: NONE SEEN
Trich, Wet Prep: NONE SEEN
Yeast Wet Prep HPF POC: NONE SEEN

## 2012-01-09 MED ORDER — FLUCONAZOLE 150 MG PO TABS: 150.0000 mg | ORAL_TABLET | Freq: Once | ORAL | Status: AC

## 2012-01-09 NOTE — Progress Notes (Signed)
Alexis Wall 1966-08-16 [id number]   History:    46 y.o.  for annual exam and was stating that recently she had been on a body aches for a sinus infection and for the past few days had been complaining of vulvar pruritus but no discharge. She also is scheduled to undergo additional views as a result of her recent mammogram demonstrating fibroglandular changes on the right breast and a questionable right mass. Patient with history of total, hysterectomy in 2011. Patient with family history of colon cancer patient had a prior benign colonic polyp in 2005. Patient frequently does her self breast examination.  Past medical history,surgical history, family history and social history were all reviewed and documented in the EPIC chart.  Gynecologic History No LMP recorded. Patient has had a hysterectomy. Contraception: Hysterectomy Last Pap: 2011. Results were: normal Last m mammogram: February of 2013. Results were: abnormal additional views scheduled of the right breast for suspicious right mass.  Obstetric History OB History    Grav Para Term Preterm Abortions TAB SAB Ect Mult Living   3 2 2  1  1   2      # Outc Date GA Lbr Len/2nd Wgt Sex Del Anes PTL Lv   1 TRM     M CS  No Yes   2 TRM     F CS  No Yes   3 SAB                ROS:  Was performed and pertinent positives and negatives are included in the history.  Exam: chaperone present  BP 134/88  Ht 5' 3.5" (1.613 m)  Wt 239 lb (108.41 kg)  BMI 41.67 kg/m2  Body mass index is 41.67 kg/(m^2).  General appearance : Well developed well nourished female. No acute distress HEENT: Neck supple, trachea midline, no carotid bruits, no thyroidmegaly Lungs: Clear to auscultation, no rhonchi or wheezes, or rib retractions  Heart: Regular rate and rhythm, no murmurs or gallops Breast:Examined in sitting and supine position were symmetrical in appearance, no palpable masses or tenderness,  no skin retraction, no nipple inversion, no nipple  discharge, no skin discoloration, no axillary or supraclavicular lymphadenopathy Abdomen: no palpable masses or tenderness, no rebound or guarding Extremities: no edema or skin discoloration or tenderness  Pelvic:  Bartholin, Urethra, Skene Glands: Within normal limits             Vagina: No gross lesions or discharge  Cervix: Absen absent  Adnexa  Without masses or tenderness  Anus and perineum  normal   Rectovaginal  normal sphincter tone without palpated masses or tenderness             Hemoccult obtained pending at time of this dictation   Wet prep few bacteria otherwise normal  Assessment/Plan:  46 y.o. female for annual exam with vulvar pruritus after being on a body aches despite and negatives vaginal wet prep we'll place her on Diflucan 150 mg one by mouth. Patient is overdue for her colonoscopy and we'll be scheduling accordingly. She will followup with a scheduled mammographic and sonographic views of the right breast as previously recommended by the radiologist. Dr. Jarold Motto from [facility] we'll be doing her labs the next few months. We discussed new screening Pap smear guidelines and she will no longer needs Pap smears since she had a hysterectomy and no prior history of abnormal Pap smears. Her repeat blood pressure was 134/88 she had not taken her blood pressure  medication today. Fecal occult blood testing pending at time of this dictation. We discussed importance of calcium and vitamin D as well as regular exercise for osteoporosis prevention. Literature information on weight reduction and exercise was provided.    Ok Edwards MD, 1:41 PM 01/09/2012

## 2012-01-09 NOTE — Patient Instructions (Signed)
Exercise to Lose Weight Exercise and a healthy diet may help you lose weight. Your doctor may suggest specific exercises. EXERCISE IDEAS AND TIPS  Choose low-cost things you enjoy doing, such as walking, bicycling, or exercising to workout videos.   Take stairs instead of the elevator.   Walk during your lunch break.   Park your car further away from work or school.   Go to a gym or an exercise class.   Start with 5 to 10 minutes of exercise each day. Build up to 30 minutes of exercise 4 to 6 days a week.   Wear shoes with good support and comfortable clothes.   Stretch before and after working out.   Work out until you breathe harder and your heart beats faster.   Drink extra water when you exercise.   Do not do so much that you hurt yourself, feel dizzy, or get very short of breath.  Exercises that burn about 150 calories:  Running 1  miles in 15 minutes.   Playing volleyball for 45 to 60 minutes.   Washing and waxing a car for 45 to 60 minutes.   Playing touch football for 45 minutes.   Walking 1  miles in 35 minutes.   Pushing a stroller 1  miles in 30 minutes.   Playing basketball for 30 minutes.   Raking leaves for 30 minutes.   Bicycling 5 miles in 30 minutes.   Walking 2 miles in 30 minutes.   Dancing for 30 minutes.   Shoveling snow for 15 minutes.   Swimming laps for 20 minutes.   Walking up stairs for 15 minutes.   Bicycling 4 miles in 15 minutes.   Gardening for 30 to 45 minutes.   Jumping rope for 15 minutes.   Washing windows or floors for 45 to 60 minutes.  Document Released: 12/15/2010 Document Revised: 07/25/2011 Document Reviewed: 12/15/2010 Exit[employer] Patient Information 2012 [employer].                                                  Cholesterol Control Diet  Cholesterol levels in your body are determined significantly by your diet. Cholesterol levels may also be related to heart disease. The following material helps to  explain this relationship and discusses what you can do to help keep your heart healthy. Not all cholesterol is bad. Low-density lipoprotein (LDL) cholesterol is the "bad" cholesterol. It may cause fatty deposits to build up inside your arteries. High-density lipoprotein (HDL) cholesterol is "good." It helps to remove the "bad" LDL cholesterol from your blood. Cholesterol is a very important risk factor for heart disease. Other risk factors are high blood pressure, smoking, stress, heredity, and weight. The heart muscle gets its supply of blood through the coronary arteries. If your LDL cholesterol is high and your HDL cholesterol is low, you are at risk for having fatty deposits build up in your coronary arteries. This leaves less room through which blood can flow. Without sufficient blood and oxygen, the heart muscle cannot function properly and you may feel chest pains (angina pectoris). When a coronary artery closes up entirely, a part of the heart muscle may die, causing a heart attack (myocardial infarction). CHECKING CHOLESTEROL When your caregiver sends your blood to a lab to be analyzed for cholesterol, a complete lipid (fat) profile may be done. With   this test, the total amount of cholesterol and levels of LDL and HDL are determined. Triglycerides are a type of fat that circulates in the blood and can also be used to determine heart disease risk. The list below describes what the numbers should be: Test: Total Cholesterol.  Less than 200 mg/dl.  Test: LDL "bad cholesterol."  Less than 100 mg/dl.   Less than 70 mg/dl if you are at very high risk of a heart attack or sudden cardiac death.  Test: HDL "good cholesterol."  Greater than 50 mg/dl for women.   Greater than 40 mg/dl for men.  Test: Triglycerides.  Less than 150 mg/dl.  CONTROLLING CHOLESTEROL WITH DIET Although exercise and lifestyle factors are important, your diet is key. That is because certain foods are known to raise  cholesterol and others to lower it. The goal is to balance foods for their effect on cholesterol and more importantly, to replace saturated and trans fat with other types of fat, such as monounsaturated fat, polyunsaturated fat, and omega-3 fatty acids. On average, a person should consume no more than 15 to 17 g of saturated fat daily. Saturated and trans fats are considered "bad" fats, and they will raise LDL cholesterol. Saturated fats are primarily found in animal products such as meats, butter, and cream. However, that does not mean you need to sacrifice all your favorite foods. Today, there are good tasting, low-fat, low-cholesterol substitutes for most of the things you like to eat. Choose low-fat or nonfat alternatives. Choose round or loin cuts of red meat, since these types of cuts are lowest in fat and cholesterol. Chicken (without the skin), fish, veal, and ground turkey breast are excellent choices. Eliminate fatty meats, such as hot dogs and salami. Even shellfish have little or no saturated fat. Have a 3 oz (85 g) portion when you eat lean meat, poultry, or fish. Trans fats are also called "partially hydrogenated oils." They are oils that have been scientifically manipulated so that they are solid at room temperature resulting in a longer shelf life and improved taste and texture of foods in which they are added. Trans fats are found in stick margarine, some tub margarines, cookies, crackers, and baked goods.  When baking and cooking, oils are an excellent substitute for butter. The monounsaturated oils are especially beneficial since it is believed they lower LDL and raise HDL. The oils you should avoid entirely are saturated tropical oils, such as coconut and palm.  Remember to eat liberally from food groups that are naturally free of saturated and trans fat, including fish, fruit, vegetables, beans, grains (barley, rice, couscous, bulgur wheat), and pasta (without cream sauces).  IDENTIFYING  FOODS THAT LOWER CHOLESTEROL  Soluble fiber may lower your cholesterol. This type of fiber is found in fruits such as apples, vegetables such as broccoli, potatoes, and carrots, legumes such as beans, peas, and lentils, and grains such as barley. Foods fortified with plant sterols (phytosterol) may also lower cholesterol. You should eat at least 2 g per day of these foods for a cholesterol lowering effect.  Read package labels to identify low-saturated fats, trans fats free, and low-fat foods at the supermarket. Select cheeses that have only 2 to 3 g saturated fat per ounce. Use a heart-healthy tub margarine that is free of trans fats or partially hydrogenated oil. When buying baked goods (cookies, crackers), avoid partially hydrogenated oils. Breads and muffins should be made from whole grains (whole-wheat or whole oat flour, instead of "flour" or "  enriched flour"). Buy non-creamy canned soups with reduced salt and no added fats.  FOOD PREPARATION TECHNIQUES  Never deep-fry. If you must fry, either stir-fry, which uses very little fat, or use non-stick cooking sprays. When possible, broil, bake, or roast meats, and steam vegetables. Instead of dressing vegetables with butter or margarine, use lemon and herbs, applesauce and cinnamon (for squash and sweet potatoes), nonfat yogurt, salsa, and low-fat dressings for salads.  LOW-SATURATED FAT / LOW-FAT FOOD SUBSTITUTES Meats / Saturated Fat (g)  Avoid: Steak, marbled (3 oz/85 g) / 11 g   Choose: Steak, lean (3 oz/85 g) / 4 g   Avoid: Hamburger (3 oz/85 g) / 7 g   Choose: Hamburger, lean (3 oz/85 g) / 5 g   Avoid: Ham (3 oz/85 g) / 6 g   Choose: Ham, lean cut (3 oz/85 g) / 2.4 g   Avoid: Chicken, with skin, dark meat (3 oz/85 g) / 4 g   Choose: Chicken, skin removed, dark meat (3 oz/85 g) / 2 g   Avoid: Chicken, with skin, light meat (3 oz/85 g) / 2.5 g   Choose: Chicken, skin removed, light meat (3 oz/85 g) / 1 g  Dairy / Saturated Fat  (g)  Avoid: Whole milk (1 cup) / 5 g   Choose: Low-fat milk, 2% (1 cup) / 3 g   Choose: Low-fat milk, 1% (1 cup) / 1.5 g   Choose: Skim milk (1 cup) / 0.3 g   Avoid: Hard cheese (1 oz/28 g) / 6 g   Choose: Skim milk cheese (1 oz/28 g) / 2 to 3 g   Avoid: Cottage cheese, 4% fat (1 cup) / 6.5 g   Choose: Low-fat cottage cheese, 1% fat (1 cup) / 1.5 g   Avoid: Ice cream (1 cup) / 9 g   Choose: Sherbet (1 cup) / 2.5 g   Choose: Nonfat frozen yogurt (1 cup) / 0.3 g   Choose: Frozen fruit bar / trace   Avoid: Whipped cream (1 tbs) / 3.5 g   Choose: Nondairy whipped topping (1 tbs) / 1 g  Condiments / Saturated Fat (g)  Avoid: Mayonnaise (1 tbs) / 2 g   Choose: Low-fat mayonnaise (1 tbs) / 1 g   Avoid: Butter (1 tbs) / 7 g   Choose: Extra light margarine (1 tbs) / 1 g   Avoid: Coconut oil (1 tbs) / 11.8 g   Choose: Olive oil (1 tbs) / 1.8 g   Choose: Corn oil (1 tbs) / 1.7 g   Choose: Safflower oil (1 tbs) / 1.2 g   Choose: Sunflower oil (1 tbs) / 1.4 g   Choose: Soybean oil (1 tbs) / 2.4 g   Choose: Canola oil (1 tbs) / 1 g  Document Released: 11/12/2005 Document Revised: 07/25/2011 Document Reviewed: 05/03/2011 [employer] Patient Information 2012 [employer].   

## 2012-01-10 LAB — POC HEMOCCULT BLD/STL (OFFICE/1-CARD/DIAGNOSTIC): Fecal Occult Blood, POC: NEGATIVE

## 2012-01-16 ENCOUNTER — Ambulatory Visit
Admission: RE | Admit: 2012-01-16 | Discharge: 2012-01-16 | Disposition: A | Discharge status: Home / Self Care | Payer: BC Managed Care – PPO | Source: Ambulatory Visit | Attending: Gynecology | Admitting: Gynecology

## 2012-01-16 DIAGNOSIS — N63 Unspecified lump in unspecified breast: Secondary | ICD-10-CM

## 2012-05-02 ENCOUNTER — Ambulatory Visit (INDEPENDENT_AMBULATORY_CARE_PROVIDER_SITE_OTHER): Payer: BC Managed Care – PPO | Admitting: Surgery

## 2012-05-02 ENCOUNTER — Encounter (INDEPENDENT_AMBULATORY_CARE_PROVIDER_SITE_OTHER): Payer: Self-pay | Admitting: Surgery

## 2012-05-02 DIAGNOSIS — Z6841 Body Mass Index (BMI) 40.0 and over, adult: Secondary | ICD-10-CM

## 2012-05-02 NOTE — Progress Notes (Addendum)
Re:   Alexis Wall DOB:   08/29/1966 MRN:   [id number]  ASSESSMENT AND PLAN: 1.  Morbid obesity.  Weight - 239, BMI - 41.07  The patient is interested in the lap band.  She had a 1st cousin who had one placed in [place].  3 month followup with me.  Per the 1991 NIH Consensus Statement, the patient is a candidate for bariatric surgery.  The patient attended our information session and reviewed the different types of bariatric surgery.    The patient is interested in the laparoscopic adjustable gastric band.  I discussed with the patient the indications and risks of lap band surgery.  The potential risks of surgery include, but are not limited to, bleeding, infection, DVT and PE, slippage and erosion of the band, open surgery, and death.  The patient understands the importance of compliance and long term follow-up with our group after surgery.  She was given literature regarding lap band surgery.  From here we'll obtain labs, x-rays, nutrition consult, and psych consult.  2.  Hypertension. 3.  Sleep apnea, on CPAP. 4.  HIstory of colon polyps.  Has had colonoscopy about 5 years ago by Dr. Sheryn Bison.  I suggested if it is time to update her colonoscopy, for her to contact Dr. Silvano Rusk office.  [She has a gallstone on [place].  DN.  05/22/2012]   Chief Complaint  Patient presents with  . Bariatric Pre-op    Lap band initial   REFERRING PHYSICIAN: Garlan Fillers, MD, MD  HISTORY OF PRESENT ILLNESS: Alexis Wall is a 46 y.o. (DOB: [birth year])  white female whose primary care physician is Garlan Fillers, MD, MD and comes to me today for weight loss surgery.  The patient has been overweight since she got married. She gradually gained weight over time despite multiple diets. She has tried Weight Watchers, Nutrisystem, and LA weight loss without successful long-term weight loss. She thinks she's try some diet pills, but is unsure of the name.  She has a first cousin who had a LAP-BAND  done in [place] by a Dr. Smitty Cords. Her first cousin has done well and she's talked to her about the procedure. The patient seems to have a good basic understanding of the Lap Band and how it works.  She came to our information session about one year ago, and cannot remember who spoke.  Past Medical History  Diagnosis Date  . Polyp of colon   . GERD (gastroesophageal reflux disease)   . Hypertension   . Depression     [facility]  . Anemia   . 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 Diabetes mellitus     [facility]      Past Surgical History  Procedure Date  . Endometrial ablation 08/25/2009    [facility]   . Cesarean section 01/26/1999, 09/30/2002    X2.Marland Kitchen WITH BTSP  . Abdominal hysterectomy 03/21/2010    TAH      Current Outpatient Prescriptions  Medication Sig Dispense Refill  . acetaminophen (TYLENOL) 325 MG tablet Take 650 mg by mouth as needed.      Marland Kitchen FLUoxetine (PROZAC) 20 MG capsule Take 20 mg by mouth daily.      Marland Kitchen omeprazole (PRILOSEC) 20 MG capsule Take 20 mg by mouth daily.      . Prenatal Vit-Fe Fumarate-FA (PRENATAL MULTIVITAMIN) TABS Take 1 tablet by mouth daily.      . verapamil (COVERA HS) 180 MG (CO) 24  hr tablet Take 180 mg by mouth at bedtime.      Marland Kitchen zolpidem (AMBIEN) 10 MG tablet Take 10 mg by mouth at bedtime as needed.          Allergies  Allergen Reactions  . Penicillins Rash    Happened in childhood, does not remember if it spread all over her body or not.    REVIEW OF SYSTEMS: Skin:  No history of rash.  No history of abnormal moles. Infection:  No history of hepatitis or HIV.  No history of MRSA. Neurologic:  No history of stroke.  No history of seizure.  No history of headaches. Cardiac:  Hypertension since about 1980.  No history of seeing a cardiologist. Pulmonary: Sleep apnea x 2 years.  On CPAP.  Endocrine:  No diabetes. (But had gestational diabetes.) No thyroid disease. Gastrointestinal:  No history of stomach disease.  No history of liver  disease.  No history of gall bladder disease.  No history of pancreas disease. Has had colonic polyps removed on colonoscopy by Dr.Jamahl Lemmons Eloise Harman about 5 years ago.  Has fam hx of colonic polyps. Urologic:  No history of kidney stones.  No history of bladder infections. GYN:  Hysterectomy 2011 for bleeding. Musculoskeletal:  No history of joint or back disease. Hematologic:  No bleeding disorder.  No history of anemia.  Not anticoagulated. Psycho-social:  The patient is oriented.   The patient has no obvious psychologic or social impairment to understanding our conversation and plan.  SOCIAL and FAMILY HISTORY: Married. Has two children. 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  PHYSICAL EXAM: BP 129/98  Pulse 86  Temp(Src) 98.8 F (37.1 C) (Temporal)  Resp 14  Ht 5\' 4"  (1.626 m)  Wt 239 lb 4 oz (108.523 kg)  BMI 41.07 kg/m2  General: WN obese WF who is alert and generally healthy appearing.  HEENT: Normal. Pupils equal. Good dentition. Neck: Supple. No mass.  No thyroid mass. Lymph Nodes:  No supraclavicular or cervical nodes. Lungs: Clear to auscultation and symmetric breath sounds. Heart:  RRR. No murmur or rub.  Abdomen: Soft. No mass. No tenderness. No hernia. Normal bowel sounds.  No abdominal scars. Well healed pfannenstiel incision. Rectal: Not done.  Had colonoscopy and is going to explore this before any surgery. Extremities:  Good strength and ROM  in upper and lower extremities. Neurologic:  Grossly intact to motor and sensory function. Psychiatric: Has normal mood and affect. Behavior is normal.   DATA REVIEWED: Notes in chart.  Ovidio Kin, MD,  [facility] [address] [place]  [address]   [place], [place]    [address] Phone:  [phone] FAX:  [phone]

## 2012-05-22 ENCOUNTER — Ambulatory Visit (HOSPITAL_COMMUNITY)
Admission: RE | Admit: 2012-05-22 | Discharge: 2012-05-22 | Disposition: A | Discharge status: Home / Self Care | Payer: BC Managed Care – PPO | Source: Ambulatory Visit | Attending: Surgery | Admitting: Surgery

## 2012-05-22 ENCOUNTER — Other Ambulatory Visit: Payer: Self-pay

## 2012-05-22 DIAGNOSIS — I10 Essential (primary) hypertension: Secondary | ICD-10-CM | POA: Insufficient documentation

## 2012-05-22 DIAGNOSIS — K219 Gastro-esophageal reflux disease without esophagitis: Secondary | ICD-10-CM | POA: Insufficient documentation

## 2012-05-22 DIAGNOSIS — M1710 Unilateral primary osteoarthritis, unspecified knee: Secondary | ICD-10-CM | POA: Insufficient documentation

## 2012-05-22 DIAGNOSIS — K7689 Other specified diseases of liver: Secondary | ICD-10-CM | POA: Insufficient documentation

## 2012-05-22 DIAGNOSIS — D649 Anemia, unspecified: Secondary | ICD-10-CM | POA: Insufficient documentation

## 2012-05-22 DIAGNOSIS — K8020 Calculus of gallbladder without cholecystitis without obstruction: Secondary | ICD-10-CM | POA: Insufficient documentation

## 2012-05-22 DIAGNOSIS — K5710 Diverticulosis of small intestine without perforation or abscess without bleeding: Secondary | ICD-10-CM | POA: Insufficient documentation

## 2012-05-22 DIAGNOSIS — Z6841 Body Mass Index (BMI) 40.0 and over, adult: Secondary | ICD-10-CM | POA: Insufficient documentation

## 2012-05-22 DIAGNOSIS — G4730 Sleep apnea, unspecified: Secondary | ICD-10-CM | POA: Insufficient documentation

## 2012-06-07 ENCOUNTER — Ambulatory Visit: Payer: BC Managed Care – PPO | Admitting: *Deleted

## 2012-07-22 ENCOUNTER — Encounter: Payer: Self-pay | Admitting: *Deleted

## 2012-07-22 ENCOUNTER — Encounter: Payer: BC Managed Care – PPO | Attending: Surgery | Admitting: *Deleted

## 2012-07-22 DIAGNOSIS — Z713 Dietary counseling and surveillance: Secondary | ICD-10-CM | POA: Insufficient documentation

## 2012-07-22 DIAGNOSIS — Z01818 Encounter for other preprocedural examination: Secondary | ICD-10-CM | POA: Insufficient documentation

## 2012-07-22 NOTE — Patient Instructions (Signed)
   Follow Pre-Op Nutrition Goals to prepare for Lapband Surgery.   Call the [facility] at [phone] once you have been given your surgery date to enrolled in the Pre-Op Nutrition Class. You will need to attend this nutrition class 3-4 weeks prior to your surgery. 

## 2012-07-22 NOTE — Progress Notes (Signed)
  Pre-Op Assessment Visit:  Pre-Operative LAGB Surgery  Medical Nutrition Therapy:  Appt start time: 1100   End time:  1200.  Patient was seen on 07/22/2012 for Pre-Operative LAGB Nutrition Assessment. Assessment and letter of approval faxed to [facility] Bariatric Surgery Program coordinator on 07/22/2012.  Approval letter sent to [facility] and will be available in the chart under the media tab.  TANITA  BODY COMP RESULTS  07/22/12   %Fat 49.7%   Fat Mass (lbs) 119.0   Fat Free Mass (lbs) 120.0   Total Body Water (lbs) 88.0   Handouts given during visit include:  Pre-Op Goals   Protein Shakes handout  Pre-Op Diet  Patient to call for Pre-Op and Post-Op Nutrition Education at the [facility] when surgery is scheduled.

## 2012-07-25 ENCOUNTER — Encounter: Payer: Self-pay | Admitting: Gastroenterology

## 2012-08-05 ENCOUNTER — Ambulatory Visit (HOSPITAL_COMMUNITY)
Admission: RE | Admit: 2012-08-05 | Discharge: 2012-08-05 | Disposition: A | Discharge status: Home / Self Care | Payer: BC Managed Care – PPO | Source: Ambulatory Visit | Attending: Surgery | Admitting: Surgery

## 2012-08-05 ENCOUNTER — Encounter (HOSPITAL_COMMUNITY)
Admission: RE | Disposition: A | Discharge status: Home / Self Care | Payer: Self-pay | Source: Ambulatory Visit | Attending: Surgery

## 2012-08-05 DIAGNOSIS — Z01818 Encounter for other preprocedural examination: Secondary | ICD-10-CM | POA: Insufficient documentation

## 2012-08-05 HISTORY — PX: BREATH TEK H PYLORI: SHX5422

## 2012-08-05 SURGERY — BREATH TEST, FOR HELICOBACTER PYLORI

## 2012-08-06 ENCOUNTER — Encounter (HOSPITAL_COMMUNITY): Payer: Self-pay

## 2012-08-06 ENCOUNTER — Encounter (HOSPITAL_COMMUNITY): Payer: Self-pay | Admitting: Surgery

## 2012-08-11 ENCOUNTER — Emergency Department (HOSPITAL_COMMUNITY): Payer: BC Managed Care – PPO

## 2012-08-11 ENCOUNTER — Emergency Department (HOSPITAL_COMMUNITY)
Admission: EM | Admit: 2012-08-11 | Discharge: 2012-08-11 | Disposition: A | Discharge status: Home / Self Care | Payer: BC Managed Care – PPO | Attending: Emergency Medicine | Admitting: Emergency Medicine

## 2012-08-11 ENCOUNTER — Encounter (HOSPITAL_COMMUNITY): Payer: Self-pay | Admitting: *Deleted

## 2012-08-11 DIAGNOSIS — G4730 Sleep apnea, unspecified: Secondary | ICD-10-CM | POA: Insufficient documentation

## 2012-08-11 DIAGNOSIS — IMO0002 Reserved for concepts with insufficient information to code with codable children: Secondary | ICD-10-CM | POA: Insufficient documentation

## 2012-08-11 DIAGNOSIS — Z87891 Personal history of nicotine dependence: Secondary | ICD-10-CM | POA: Insufficient documentation

## 2012-08-11 DIAGNOSIS — Y93I9 Activity, other involving external motion: Secondary | ICD-10-CM | POA: Insufficient documentation

## 2012-08-11 DIAGNOSIS — D649 Anemia, unspecified: Secondary | ICD-10-CM | POA: Insufficient documentation

## 2012-08-11 DIAGNOSIS — Y998 Other external cause status: Secondary | ICD-10-CM | POA: Insufficient documentation

## 2012-08-11 DIAGNOSIS — M129 Arthropathy, unspecified: Secondary | ICD-10-CM | POA: Insufficient documentation

## 2012-08-11 DIAGNOSIS — I10 Essential (primary) hypertension: Secondary | ICD-10-CM | POA: Insufficient documentation

## 2012-08-11 DIAGNOSIS — E119 Type 2 diabetes mellitus without complications: Secondary | ICD-10-CM | POA: Insufficient documentation

## 2012-08-11 DIAGNOSIS — K219 Gastro-esophageal reflux disease without esophagitis: Secondary | ICD-10-CM | POA: Insufficient documentation

## 2012-08-11 DIAGNOSIS — S6990XA Unspecified injury of unspecified wrist, hand and finger(s), initial encounter: Secondary | ICD-10-CM

## 2012-08-11 MED ORDER — HYDROCODONE-ACETAMINOPHEN 5-325 MG PO TABS: 1.0000 | ORAL_TABLET | Freq: Four times a day (QID) | ORAL | Status: DC | PRN

## 2012-08-11 MED ORDER — NAPROXEN 375 MG PO TABS: 375.0000 mg | ORAL_TABLET | Freq: Two times a day (BID) | ORAL | Status: DC

## 2012-08-11 NOTE — ED Notes (Signed)
Returned from xray

## 2012-08-11 NOTE — ED Notes (Signed)
Ointment applied and wrapped with sterile 2x2s and 1/2 inch kling

## 2012-08-11 NOTE — Progress Notes (Signed)
Orthopedic Tech Progress Note Patient Details:  Alexis Wall 11/12/1966 [id number]  Ortho Devices Type of Ortho Device: Buddy tape;Finger splint Ortho Device/Splint Location: (L) UE Ortho Device/Splint Interventions: Application   Jennye Moccasin 08/11/2012, 2:08 PM

## 2012-08-11 NOTE — ED Notes (Signed)
Skin on right small finger and ring finger are intact.  Fingers buddy taped. Ortho tech on his way to splint finger.
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
## 2012-08-12 NOTE — ED Provider Notes (Signed)
Medical screening examination/treatment/procedure(s) were performed by non-physician practitioner and as supervising physician I was immediately available for consultation/collaboration.   Willmer Fellers B. Bernette Mayers, MD 08/12/12 [phone]

## 2012-08-25 ENCOUNTER — Encounter: Payer: Self-pay | Admitting: Gastroenterology

## 2012-08-25 ENCOUNTER — Ambulatory Visit (AMBULATORY_SURGERY_CENTER): Payer: BC Managed Care – PPO | Admitting: *Deleted

## 2012-08-25 VITALS — Ht 64.0 in | Wt 237.8 lb

## 2012-08-25 DIAGNOSIS — Z1211 Encounter for screening for malignant neoplasm of colon: Secondary | ICD-10-CM

## 2012-08-25 DIAGNOSIS — Z86010 Personal history of colonic polyps: Secondary | ICD-10-CM

## 2012-08-25 MED ORDER — MOVIPREP 100 G PO SOLR: ORAL | Status: DC

## 2012-09-08 ENCOUNTER — Ambulatory Visit (AMBULATORY_SURGERY_CENTER): Payer: BC Managed Care – PPO | Admitting: Gastroenterology

## 2012-09-08 ENCOUNTER — Encounter: Payer: Self-pay | Admitting: Gastroenterology

## 2012-09-08 VITALS — BP 120/67 | HR 60 | Temp 97.6°F | Resp 19 | Ht 64.0 in | Wt 237.0 lb

## 2012-09-08 DIAGNOSIS — Z86010 Personal history of colonic polyps: Secondary | ICD-10-CM

## 2012-09-08 DIAGNOSIS — Z1211 Encounter for screening for malignant neoplasm of colon: Secondary | ICD-10-CM

## 2012-09-08 DIAGNOSIS — D126 Benign neoplasm of colon, unspecified: Secondary | ICD-10-CM

## 2012-09-08 MED ORDER — SODIUM CHLORIDE 0.9 % IV SOLN: 500.0000 mL | INTRAVENOUS | Status: DC

## 2012-09-08 NOTE — Progress Notes (Signed)
Patient did not experience any of the following events: a burn prior to discharge; a fall within the facility; wrong site/side/patient/procedure/implant event; or a hospital transfer or hospital admission upon discharge from the facility. (G8907) Patient did not have preoperative order for IV antibiotic SSI prophylaxis. (G8918)  

## 2012-09-08 NOTE — Patient Instructions (Addendum)
YOU HAD AN ENDOSCOPIC PROCEDURE TODAY AT THE [facility]: Refer to the procedure report that was given to you for any specific questions about what was found during the examination.  If the procedure report does not answer your questions, please call your gastroenterologist to clarify.  If you requested that your care partner not be given the details of your procedure findings, then the procedure report has been included in a sealed envelope for you to review at your convenience later.  YOU SHOULD EXPECT: Some feelings of bloating in the abdomen. Passage of more gas than usual.  Walking can help get rid of the air that was put into your GI tract during the procedure and reduce the bloating. If you had a lower endoscopy (such as a colonoscopy or flexible sigmoidoscopy) you may notice spotting of blood in your stool or on the toilet paper. If you underwent a bowel prep for your procedure, then you may not have a normal bowel movement for a few days.  DIET: Your first meal following the procedure should be a light meal and then it is ok to progress to your normal diet.  A half-sandwich or bowl of soup is an example of a good first meal.  Heavy or fried foods are harder to digest and may make you feel nauseous or bloated.  Likewise meals heavy in dairy and vegetables can cause extra gas to form and this can also increase the bloating.  Drink plenty of fluids but you should avoid alcoholic beverages for 24 hours.  ACTIVITY: Your care partner should take you home directly after the procedure.  You should plan to take it easy, moving slowly for the rest of the day.  You can resume normal activity the day after the procedure however you should NOT DRIVE or use heavy machinery for 24 hours (because of the sedation medicines used during the test).    SYMPTOMS TO REPORT IMMEDIATELY: A gastroenterologist can be reached at any hour.  During normal business hours, 8:30 AM to 5:00 PM Monday through Friday,  call [phone].  After hours and on weekends, please call the GI answering service at [phone] who will take a message and have the physician on call contact you.   Following lower endoscopy (colonoscopy or flexible sigmoidoscopy):  Excessive amounts of blood in the stool  Significant tenderness or worsening of abdominal pains  Swelling of the abdomen that is new, acute  Fever of 100F or higher  Following upper endoscopy (EGD)  Vomiting of blood or coffee ground material  New chest pain or pain under the shoulder blades  Painful or persistently difficult swallowing  New shortness of breath  Fever of 100F or higher  Black, tarry-looking stools  FOLLOW UP: If any biopsies were taken you will be contacted by phone or by letter within the next 1-3 weeks.  Call your gastroenterologist if you have not heard about the biopsies in 3 weeks.  Our staff will call the home number listed on your records the next business day following your procedure to check on you and address any questions or concerns that you may have at that time regarding the information given to you following your procedure. This is a courtesy call and so if there is no answer at the home number and we have not heard from you through the emergency physician on call, we will assume that you have returned to your regular daily activities without incident.  SIGNATURES/CONFIDENTIALITY: You and/or your care   partner have signed paperwork which will be entered into your electronic medical record.  These signatures attest to the fact that that the information above on your After Visit Summary has been reviewed and is understood.  Full responsibility of the confidentiality of this discharge information lies with you and/or your care-partner.  

## 2012-09-08 NOTE — Op Note (Signed)
[facility] [address] [place], [address]   COLONOSCOPY PROCEDURE REPORT  PATIENT: Alexis, Wall  MR#: [id number] BIRTHDATE: 01-Sep-1966 , 46  yrs. old GENDER: Female ENDOSCOPIST: Mardella Layman, MD, Clementeen Graham REFERRED BY:  Jarome Matin, M.D. PROCEDURE DATE:  09/08/2012 PROCEDURE:   Colonoscopy, screening ASA CLASS:   Class II INDICATIONS:average risk patient for colon cancer. MEDICATIONS: propofol (Diprivan) 350mg  IV  DESCRIPTION OF PROCEDURE:   After the risks and benefits and of the procedure were explained, informed consent was obtained.  A digital rectal exam revealed no abnormalities of the rectum.    The LB [id number]  endoscope was introduced through the anus and advanced to the cecum, which was identified by both the appendix and ileocecal valve .  The quality of the prep was excellent, using MoviPrep .  The instrument was then slowly withdrawn as the colon was fully examined.     COLON FINDINGS: A normal appearing cecum, ileocecal valve, and appendiceal orifice were identified.  The ascending, hepatic flexure, transverse, splenic flexure, descending, sigmoid colon and rectum appeared unremarkable.  No polyps or cancers were seen. Retroflexed views revealed no abnormalities.     The scope was then withdrawn from the patient and the procedure completed.  COMPLICATIONS: There were no complications. ENDOSCOPIC IMPRESSION:NORMAL EXAM,NOPOLYPS NOTED. Normal colon  RECOMMENDATIONS: 1.  continue current medications 2.  Continue current colorectal screening recommendations for "routine risk" patients with a repeat colonoscopy in 10 years.   REPEAT EXAM:  cc:  _______________________________ eSignedMardella Layman, MD, [facility] 2 09/08/2012 10:52 AM

## 2012-09-09 ENCOUNTER — Telehealth: Payer: Self-pay | Admitting: *Deleted

## 2012-09-09 NOTE — Telephone Encounter (Signed)
  Follow up Call-  Call back number 09/08/2012  Post procedure Call Back phone  # [phone]  Permission to leave phone message Yes     Patient questions:  Do you have a fever, pain , or abdominal swelling? no Pain Score  0 *  Have you tolerated food without any problems? yes  Have you been able to return to your normal activities? yes  Do you have any questions about your discharge instructions: Diet   no Medications  no Follow up visit  no  Do you have questions or concerns about your Care? no  Actions: * If pain score is 4 or above: No action needed, pain <4.

## 2012-09-11 ENCOUNTER — Encounter: Payer: BC Managed Care – PPO | Attending: Surgery | Admitting: *Deleted

## 2012-09-11 DIAGNOSIS — Z713 Dietary counseling and surveillance: Secondary | ICD-10-CM | POA: Insufficient documentation

## 2012-09-11 DIAGNOSIS — Z01818 Encounter for other preprocedural examination: Secondary | ICD-10-CM | POA: Insufficient documentation

## 2012-09-13 NOTE — Progress Notes (Signed)
Bariatric Class:  Appt start time: 0830 end time:  0930.  Pre-Operative Nutrition Class  Patient was seen on 09/11/12 for Pre-Operative Bariatric Surgery Education at [facility].   Surgery date: 10/14/12 Surgery type: LAGB Start weight at [facility]: 239.0 lb (07/22/12)  Weight today: 233.6 lb Weight change: 5.4 lb Total weight lost: 5.4 lb BMI: 40.0  Samples given per MNT protocol: Celebrate Vitamins Multivitamin Lot # [id number] Exp: 09/14  Celebrate Vitamins Multivitamin-Complete Lot # [id number] Exp: 11/14  Opurity Calcium Citrate Lot # [id number] Exp: 11/14  Celebrate Vitamins Sublingual B12 Lot # [id number] Exp:05/15  Unjury Protein Powder Lot # [id number] Exp: 12/14  The following the learning objective met by the patient during this course:  Identifies Pre-Op Dietary Goals and will begin 2 weeks pre-operatively  Identifies appropriate sources of fluids and proteins   States protein recommendations and appropriate sources pre and post-operatively  Identifies Post-Operative Dietary Goals and will follow for 2 weeks post-operatively  Identifies appropriate multivitamin and calcium sources  Describes the need for physical activity post-operatively and will follow MD recommendations  States when to call healthcare provider regarding medication questions or post-operative complications  Handouts given during class include:  Pre-Op Bariatric Surgery Diet Handout  Protein Shake Handout  Post-Op Bariatric Surgery Nutrition Handout  BELT Program Information Flyer  Support Group Information Flyer  Follow-Up Plan: Patient will follow-up at [facility] 2 weeks post operatively for diet advancement per MD.

## 2012-09-14 ENCOUNTER — Encounter: Payer: Self-pay | Admitting: *Deleted

## 2012-09-14 NOTE — Patient Instructions (Signed)
Follow:   Pre-Op Diet per MD 2 weeks prior to surgery  Phase 2- Liquids (clear/full) 2 weeks after surgery  Vitamin/Mineral/Calcium guidelines for purchasing bariatric supplements  Exercise guidelines pre and post-op per MD  Follow-up at [facility] in 2 weeks post-op for diet advancement. Contact Kaitlinn Iversen as needed with questions/concerns. 

## 2012-09-26 ENCOUNTER — Other Ambulatory Visit (INDEPENDENT_AMBULATORY_CARE_PROVIDER_SITE_OTHER): Payer: Self-pay | Admitting: Surgery

## 2012-09-26 NOTE — Progress Notes (Signed)
Patient has preop appointment on 10/07/12.  Surgery day is 10/14/12.  Need orders in [facility] for preop appointment.  Thank You.

## 2012-10-01 ENCOUNTER — Ambulatory Visit (INDEPENDENT_AMBULATORY_CARE_PROVIDER_SITE_OTHER): Payer: BC Managed Care – PPO | Admitting: Surgery

## 2012-10-01 ENCOUNTER — Encounter (INDEPENDENT_AMBULATORY_CARE_PROVIDER_SITE_OTHER): Payer: Self-pay | Admitting: Surgery

## 2012-10-01 NOTE — Progress Notes (Signed)
Re:   Alexis Wall DOB:   16-Mar-1966 MRN:   [id number]  ASSESSMENT AND PLAN: 1.  Morbid obesity.  Weight - 239, BMI - 41.07  She is scheduled 10/14/2012.  Alexis Wall has decided to cancel her surgery for now.  She is under financial and personal pressures that she thinks will compromise her recovery and her weight loss.  We will cancel her surgery and see her on a PRN basis.  2.  Hypertension. 3.  Sleep apnea, on CPAP. 4.  HIstory of colon polyps.  Has had colonoscopy about 5 years ago by Dr. Sheryn Wall. 5.  She has a gallstone on [place].    I gave her a copy of her [place] report and literature on gall bladder surgery. 6.  Duodenal diverticula   Chief Complaint  Patient presents with  . Weight Loss Surgery   REFERRING PHYSICIAN: Garlan Fillers, MD  HISTORY OF PRESENT ILLNESS: Alexis Wall is a 46 y.o. (DOB: [birth year])  white female whose primary care physician is Alexis Fillers, MD and comes to me today for her preop visit for a lap band.  She is scheduled for 10/14/2012 for lap band.  She has her husband with her.  But she is having a lot of financial and personal issues that she feels she is not ready for surgery.  She comes with her husband, who has Crohn's disease, and who was a patient of Dr. Zachery Wall.  [place] - 05/22/2012 - gallstone UGI - 6/272013 - small duodenal diverticula Psych eval - Alexis Wall - 08/06/2012 - okay to go ahead.  Past Medical History  Diagnosis Date  . Polyp of colon   . GERD (gastroesophageal reflux disease)   . Hypertension   . Depression     [facility]  . Anemia   . 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 Morbid obesity   . Sleep apnea     Wears CPAP  . Diabetes mellitus     GESTATIONAL-not now     Current Outpatient Prescriptions  Medication Sig Dispense Refill  . FLUoxetine (PROZAC) 20 MG capsule Take 20 mg by mouth daily.      . naproxen (NAPROSYN) 375 MG tablet Take 1 tablet (375 mg total) by mouth 2 (two) times daily.  20 tablet  0    . omeprazole (PRILOSEC) 20 MG capsule Take 20 mg by mouth daily.      . verapamil (COVERA HS) 180 MG (CO) 24 hr tablet Take 180 mg by mouth at bedtime.      Marland Kitchen zolpidem (AMBIEN) 10 MG tablet Take 10 mg by mouth at bedtime as needed.         Allergies  Allergen Reactions  . Penicillins Rash    Happened in childhood, does not remember if it spread all over her body or not.    REVIEW OF SYSTEMS: Skin:  No history of rash.  No history of abnormal moles. Infection:  No history of hepatitis or HIV.  No history of MRSA. Neurologic:  No history of stroke.  No history of seizure.  No history of headaches. Cardiac:  Hypertension since about 1980.  No history of seeing a cardiologist. Pulmonary: Sleep apnea x 2 years.  On CPAP.  Endocrine:  No diabetes. (But had gestational diabetes.) No thyroid disease. Gastrointestinal:  No history of stomach disease.  No history of liver disease.  No history of gall bladder disease.  No history of pancreas disease. Has had colonic  polyps removed on colonoscopy by AlexisBion Wall Alexis Wall about 5 years ago.  Has fam hx of colonic polyps. Urologic:  No history of kidney stones.  No history of bladder infections. GYN:  Hysterectomy 2011 for bleeding. Musculoskeletal:  No history of joint or back disease. Hematologic:  No bleeding disorder.  No history of anemia.  Not anticoagulated. Psycho-social:  The patient is oriented.   The patient has no obvious psychologic or social impairment to understanding our conversation and plan.  SOCIAL and FAMILY HISTORY: Married. Has two children. 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  PHYSICAL EXAM: BP 138/90  Pulse 76  Temp 98.2 F (36.8 C) (Temporal)  Resp 14  Ht 5\' 4"  (1.626 m)  Wt 234 lb (106.142 kg)  BMI 40.17 kg/m2  Did examine today  DATA REVIEWED: I reviewed her tests with her.  Ovidio Kin, MD,  [facility] [address]  [address]   [place],  [place]    [address] Phone:  [phone] FAX:  [phone]

## 2012-10-07 ENCOUNTER — Other Ambulatory Visit (HOSPITAL_COMMUNITY): Payer: BC Managed Care – PPO

## 2012-10-14 ENCOUNTER — Encounter (HOSPITAL_COMMUNITY): Admission: RE | Payer: Self-pay | Source: Ambulatory Visit

## 2012-10-14 ENCOUNTER — Ambulatory Visit (HOSPITAL_COMMUNITY)
Admission: RE | Admit: 2012-10-14 | Payer: No Typology Code available for payment source | Source: Ambulatory Visit | Admitting: Surgery

## 2012-10-14 SURGERY — GASTRIC BANDING, LAPAROSCOPIC
Anesthesia: General

## 2012-10-28 ENCOUNTER — Ambulatory Visit: Payer: BC Managed Care – PPO

## 2012-10-29 ENCOUNTER — Encounter (INDEPENDENT_AMBULATORY_CARE_PROVIDER_SITE_OTHER): Payer: BC Managed Care – PPO | Admitting: Surgery

## 2013-04-14 ENCOUNTER — Other Ambulatory Visit: Payer: Self-pay | Admitting: Gynecology

## 2013-04-14 ENCOUNTER — Other Ambulatory Visit: Payer: Self-pay

## 2013-04-14 DIAGNOSIS — Z1231 Encounter for screening mammogram for malignant neoplasm of breast: Secondary | ICD-10-CM

## 2013-04-17 ENCOUNTER — Telehealth: Payer: Self-pay | Admitting: *Deleted

## 2013-04-17 MED ORDER — NYSTATIN-TRIAMCINOLONE 100000-0.1 UNIT/GM-% EX OINT: TOPICAL_OINTMENT | CUTANEOUS | Status: DC

## 2013-04-17 NOTE — Telephone Encounter (Signed)
Pt informed with the below note, rx sent. 

## 2013-04-17 NOTE — Telephone Encounter (Signed)
Mytrex cream she can apply 2-3 times a day underneath her breasts. This sounds like intertrigo which is a type of yeast infection that seen typically underneath the breasts or the abdomen as a result of sweat and heat.

## 2013-04-17 NOTE — Telephone Encounter (Signed)
Pt calling c/o sweating and itchy breast only under her breast due to the hot weather. Scheduled for annual 05/13/13. She asked if you would be willing to give her something? Please advise

## 2013-05-13 ENCOUNTER — Encounter: Payer: Self-pay | Admitting: Gynecology

## 2013-05-20 ENCOUNTER — Ambulatory Visit: Payer: BC Managed Care – PPO

## 2013-05-27 ENCOUNTER — Ambulatory Visit: Payer: BC Managed Care – PPO

## 2013-06-11 ENCOUNTER — Encounter: Payer: Self-pay | Admitting: Gynecology

## 2013-06-16 ENCOUNTER — Ambulatory Visit
Admission: RE | Admit: 2013-06-16 | Discharge: 2013-06-16 | Disposition: A | Discharge status: Home / Self Care | Payer: BC Managed Care – PPO | Source: Ambulatory Visit

## 2013-06-16 DIAGNOSIS — Z1231 Encounter for screening mammogram for malignant neoplasm of breast: Secondary | ICD-10-CM

## 2013-06-17 ENCOUNTER — Other Ambulatory Visit: Payer: Self-pay | Admitting: Gynecology

## 2013-06-17 DIAGNOSIS — N63 Unspecified lump in unspecified breast: Secondary | ICD-10-CM

## 2013-07-03 ENCOUNTER — Ambulatory Visit
Admission: RE | Admit: 2013-07-03 | Discharge: 2013-07-03 | Disposition: A | Discharge status: Home / Self Care | Payer: BC Managed Care – PPO | Source: Ambulatory Visit | Attending: Gynecology | Admitting: Gynecology

## 2013-07-03 DIAGNOSIS — N63 Unspecified lump in unspecified breast: Secondary | ICD-10-CM

## 2013-08-03 ENCOUNTER — Encounter: Payer: Self-pay | Admitting: Gynecology

## 2013-08-14 ENCOUNTER — Other Ambulatory Visit: Payer: Self-pay | Admitting: Gynecology

## 2013-08-14 NOTE — Telephone Encounter (Signed)
Patient overdue for CE since May.  Has rescheduled x 3 and now is scheduled for Oct 1st for CE with you.

## 2013-08-26 ENCOUNTER — Encounter: Payer: Self-pay | Admitting: Gynecology

## 2013-08-26 ENCOUNTER — Other Ambulatory Visit: Payer: Self-pay | Admitting: Gynecology

## 2013-08-26 ENCOUNTER — Ambulatory Visit (INDEPENDENT_AMBULATORY_CARE_PROVIDER_SITE_OTHER): Payer: BC Managed Care – PPO | Admitting: Gynecology

## 2013-08-26 VITALS — BP 134/90 | Ht 63.5 in | Wt 243.0 lb

## 2013-08-26 DIAGNOSIS — Z23 Encounter for immunization: Secondary | ICD-10-CM

## 2013-08-26 DIAGNOSIS — R6882 Decreased libido: Secondary | ICD-10-CM

## 2013-08-26 DIAGNOSIS — R5381 Other malaise: Secondary | ICD-10-CM

## 2013-08-26 DIAGNOSIS — N942 Vaginismus: Secondary | ICD-10-CM

## 2013-08-26 DIAGNOSIS — N951 Menopausal and female climacteric states: Secondary | ICD-10-CM

## 2013-08-26 DIAGNOSIS — R5383 Other fatigue: Secondary | ICD-10-CM | POA: Insufficient documentation

## 2013-08-26 DIAGNOSIS — Z01419 Encounter for gynecological examination (general) (routine) without abnormal findings: Secondary | ICD-10-CM

## 2013-08-26 DIAGNOSIS — I10 Essential (primary) hypertension: Secondary | ICD-10-CM

## 2013-08-26 DIAGNOSIS — R635 Abnormal weight gain: Secondary | ICD-10-CM

## 2013-08-26 NOTE — Progress Notes (Signed)
Alexis Wall 06-04-1966 [id number]   History:    47 y.o.  for annual gyn exam who presented to the office today complaining of hot flashes, or ability, mood swing night sweats as well as decrease of energy and sex drive. Patient's primary physician is Dr. Sheryn Bison who will be doing her lab work next week. Patient with prior history of total abdominal hysterectomy and ovarian conservation 2011. Patient did have gestational diabetes with her pregnancy. Patient has always had normal Pap smears prior to her hysterectomy. Patient with personal history of colon polyps as well as strong family history. Patient stated her last colonoscopy was in 2013. Her mammogram was normal this year. She is currently being treated for hypertension and is currently of her upper canal. Patient requesting flu vaccine today. Patient is overweight with a BMI of 42.37  Past medical history,surgical history, family history and social history were all reviewed and documented in the EPIC chart.  Gynecologic History No LMP recorded. Patient has had a hysterectomy. Contraception: status post hysterectomy Last Pap: 2011. Results were: normal Last mammogram: 2014. Results were: normal  Obstetric History OB History  Gravida Para Term Preterm AB SAB TAB Ectopic Multiple Living  3 2 2  1 1    2     # Outcome Date GA Lbr Len/2nd Weight Sex Delivery Anes PTL Lv  3 SAB           2 TRM     F CS  N Y  1 TRM     M CS  N Y       ROS: A ROS was performed and pertinent positives and negatives are included in the history.  GENERAL: No fevers or chills. HEENT: No change in vision, no earache, sore throat or sinus congestion. NECK: No pain or stiffness. CARDIOVASCULAR: No chest pain or pressure. No palpitations. PULMONARY: No shortness of breath, cough or wheeze. GASTROINTESTINAL: No abdominal pain, nausea, vomiting or diarrhea, melena or bright red blood per rectum. GENITOURINARY: No urinary frequency, urgency, hesitancy or dysuria.  MUSCULOSKELETAL: No joint or muscle pain, no back pain, no recent trauma. DERMATOLOGIC: No rash, no itching, no lesions. ENDOCRINE: No polyuria, polydipsia, no heat or cold intolerance. No recent change in weight. HEMATOLOGICAL: No anemia or easy bruising or bleeding. NEUROLOGIC: No headache, seizures, numbness, tingling or weakness. PSYCHIATRIC: No depression, no loss of interest in normal activity or change in sleep pattern.     Exam: chaperone present  BP 134/90  Ht 5' 3.5" (1.613 m)  Wt 243 lb (110.224 kg)  BMI 42.37 kg/m2  Body mass index is 42.37 kg/(m^2).  General appearance : Well developed well nourished female. No acute distress HEENT: Neck supple, trachea midline, no carotid bruits, no thyroidmegaly Lungs: Clear to auscultation, no rhonchi or wheezes, or rib retractions  Heart: Regular rate and rhythm, no murmurs or gallops Breast:Examined in sitting and supine position were symmetrical in appearance, no palpable masses or tenderness,  no skin retraction, no nipple inversion, no nipple discharge, no skin discoloration, no axillary or supraclavicular lymphadenopathy Abdomen: no palpable masses or tenderness, no rebound or guarding Extremities: no edema or skin discoloration or tenderness  Pelvic:  Bartholin, Urethra, Skene Glands: Within normal limits             Vagina: No gross lesions or discharge  Cervix: absence  Uterus absent  Adnexa limited due to abdominal girth  Anus and perineum  normal   Rectovaginal  normal sphincter tone without palpated masses  or tenderness             Hemoccult card provided     Assessment/Plan:  47 y.o. female for annual exam will return to the office in the next couple weeks for an ultrasound for better assessment of her adnexa which was somewhat difficult because of her abdominal girth and her vaginismus. Patient's blood work we've gone by Dr. Ivery Quale her primary physician. We will check today an [facility]. Literature and information on  the perimenopause/menopause/hormone replacement therapy was provided. She was provided with Hemoccult cards to submit to the office for testing. Pap smear not done today in accordance to the new guidelines. Patient received the flu vaccine today. We discussed importance of calcium and vitamin D and regular exercise for osteoporosis prevention.    Ok Edwards MD, 12:08 PM 08/26/2013

## 2013-08-26 NOTE — Patient Instructions (Addendum)
Perimenopause Perimenopause is the time when your body begins to move into the menopause (no menstrual period for 12 straight months). It is a natural process. Perimenopause can begin 2 to 8 years before the menopause and usually lasts for one year after the menopause. During this time, your ovaries may or may not produce an egg. The ovaries vary in their production of estrogen and progesterone hormones each month. This can cause irregular menstrual periods, difficulty in getting pregnant, vaginal bleeding between periods and uncomfortable symptoms. CAUSES  Irregular production of the ovarian hormones, estrogen and progesterone, and not ovulating every month.  Other causes include:  Tumor of the pituitary gland in the brain.  Medical disease that affects the ovaries.  Radiation treatment.  Chemotherapy.  Unknown causes.  Heavy smoking and excessive alcohol intake can bring on perimenopause sooner. SYMPTOMS   Hot flashes.  Night sweats.  Irregular menstrual periods.  Decrease sex drive.  Vaginal dryness.  Headaches.  Mood swings.  Depression.  Memory problems.  Irritability.  Tiredness.  Weight gain.  Trouble getting pregnant.  The beginning of losing bone cells (osteoporosis).  The beginning of hardening of the arteries (atherosclerosis). DIAGNOSIS  Your caregiver will make a diagnosis by analyzing your age, menstrual history and your symptoms. They will do a physical exam noting any changes in your body, especially your female organs. Female hormone tests may or may not be helpful depending on the amount and when you produce the female hormones. However, other hormone tests may be helpful (ex. thyroid hormone) to rule out other problems. TREATMENT  The decision to treat during the perimenopause should be made by you and your caregiver depending on how the symptoms are affecting you and your life style. There are various treatments available such as:  Treating  individual symptoms with a specific medication for that symptom (ex. tranquilizer for depression).  Herbal medications that can help specific symptoms.  Counseling.  Group therapy.  No treatment. HOME CARE INSTRUCTIONS   Before seeing your caregiver, make a list of your menstrual periods (when the occur, how heavy they are, how long between periods and how long they last), your symptoms and when they started.  Take the medication as recommended by your caregiver.  Sleep and rest.  Exercise.  Eat a diet that contains calcium (good for your bones) and soy (acts like estrogen hormone).  Do not smoke.  Avoid alcoholic beverages.  Taking vitamin E may help in certain cases.  Take calcium and vitamin D supplements to help prevent bone loss.  Group therapy is sometimes helpful.  Acupuncture may help in some cases. SEEK MEDICAL CARE IF:   You have any of the above and want to know if it is perimenopause.  You want advice and treatment for any of your symptoms mentioned above.  You need a referral to a specialist (gynecologist, psychiatrist or psychologist). SEEK IMMEDIATE MEDICAL CARE IF:   You have vaginal bleeding.  Your period lasts longer than 8 days.  You periods are recurring sooner than 21 days.  You have bleeding after intercourse.  You have severe depression.  You have pain when you urinate.  You have severe headaches.  You develop vision problems. Document Released: 12/20/2004 Document Revised: 02/04/2012 Document Reviewed: 09/09/2008 ExitCare Patient Information 2014 [employer]. Hormone Therapy At menopause, your body begins making less estrogen and progesterone hormones. This causes the body to stop having menstrual periods. This is because estrogen and progesterone hormones control your periods and menstrual cycle. A   lack of estrogen may cause symptoms such as:  Hot flushes (or hot flashes).  Vaginal dryness.  Dry skin.  Loss of sex  drive.  Risk of bone loss (osteoporosis). When this happens, you may choose to take hormone therapy to get back the estrogen lost during menopause. When the hormone estrogen is given alone, it is usually referred to as ET (Estrogen Therapy). When the hormone progestin is combined with estrogen, it is generally called HT (Hormone Therapy). This was formerly known as hormone replacement therapy (HRT). Your caregiver can help you make a decision on what will be best for you. The decision to use HT seems to change often as new studies are done. Many studies do not agree on the benefits of hormone replacement therapy. LIKELY BENEFITS OF HT INCLUDE PROTECTION FROM:  Hot Flushes (also called hot flashes) - A hot flush is a sudden feeling of heat that spreads over the face and body. The skin may redden like a blush. It is connected with sweats and sleep disturbance. Women going through menopause may have hot flushes a few times a month or several times per day depending on the woman.  Osteoporosis (bone loss)- Estrogen helps guard against bone loss. After menopause, a woman's bones slowly lose calcium and become weak and brittle. As a result, bones are more likely to break. The hip, wrist, and spine are affected most often. Hormone therapy can help slow bone loss after menopause. Weight bearing exercise and taking calcium with vitamin D also can help prevent bone loss. There are also medications that your caregiver can prescribe that can help prevent osteoporosis.  Vaginal Dryness - Loss of estrogen causes changes in the vagina. Its lining may become thin and dry. These changes can cause pain and bleeding during sexual intercourse. Dryness can also lead to infections. This can cause burning and itching. (Vaginal estrogen treatment can help relieve pain, itching, and dryness.)  Urinary Tract Infections are more common after menopause because of lack of estrogen. Some women also develop urinary incontinence  because of low estrogen levels in the vagina and bladder.  Possible other benefits of estrogen include a positive effect on mood and short-term memory in women. RISKS AND COMPLICATIONS  Using estrogen alone without progesterone causes the lining of the uterus to grow. This increases the risk of lining of the uterus (endometrial) cancer. Your caregiver should give another hormone called progestin if you have a uterus.  Women who take combined (estrogen and progestin) HT appear to have an increased risk of breast cancer. The risk appears to be small, but increases throughout the time that HT is taken.  Combined therapy also makes the breast tissue slightly denser which makes it harder to read mammograms (breast X-rays).  Combined, estrogen and progesterone therapy can be taken together every day, in which case there may be spotting of blood. HT therapy can be taken cyclically in which case you will have menstrual periods. Cyclically means HT is taken for a set amount of days, then not taken, then this process is repeated.  HT may increase the risk of stroke, heart attack, breast cancer and forming blood clots in your leg.  Transdermal estrogen (estrogen that is absorbed through the skin with a patch or a cream) may have more positive results with:  Cholesterol.  Blood pressure.  Blood clots. Having the following conditions may indicate you should not have HT:  Endometrial cancer.  Liver disease.  Breast cancer.  Heart disease.  History of blood   clots.  Stroke. TREATMENT   If you choose to take HT and have a uterus, usually estrogen and progestin are prescribed.  Your caregiver will help you decide the best way to take the medications.  Possible ways to take estrogen include:  Pills.  Patches.  Gels.  Sprays.  Vaginal estrogen cream, rings and tablets.  It is best to take the lowest dose possible that will help your symptoms and take them for the shortest period of  time that you can.  Hormone therapy can help relieve some of the problems (symptoms) that affect women at menopause. Before making a decision about HT, talk to your caregiver about what is best for you. Be well informed and comfortable with your decisions. HOME CARE INSTRUCTIONS   Follow your caregivers advice when taking the medications.  A Pap test is done to screen for cervical cancer.  The first Pap test should be done at age 21.  Between ages 21 and 29, Pap tests are repeated every 2 years.  Beginning at age 30, you are advised to have a Pap test every 3 years as long as your past 3 Pap tests have been normal.  Some women have medical problems that increase the chance of getting cervical cancer. Talk to your caregiver about these problems. It is especially important to talk to your caregiver if a new problem develops soon after your last Pap test. In these cases, your caregiver may recommend more frequent screening and Pap tests.  The above recommendations are the same for women who have or have not gotten the vaccine for HPV (Human Papillomavirus).  If you had a hysterectomy for a problem that was not a cancer or a condition that could lead to cancer, then you no longer need Pap tests. However, even if you no longer need a Pap test, a regular exam is a good idea to make sure no other problems are starting.   If you are between ages 65 and 70, and you have had normal Pap tests going back 10 years, you no longer need Pap tests. However, even if you no longer need a Pap test, a regular exam is a good idea to make sure no other problems are starting.   If you have had past treatment for cervical cancer or a condition that could lead to cancer, you need Pap tests and screening for cancer for at least 20 years after your treatment.  If Pap tests have been discontinued, risk factors (such as a new sexual partner) need to be re-assessed to determine if screening should be  resumed.  Some women may need screenings more often if they are at high risk for cervical cancer.  Get mammograms done as per the advice of your caregiver. SEEK IMMEDIATE MEDICAL CARE IF:  You develop abnormal vaginal bleeding.  You have pain or swelling in your legs, shortness of breath, or chest pain.  You develop dizziness or headaches.  You have lumps or changes in your breasts or armpits.  You have slurred speech.  You develop weakness or numbness of your arms or legs.  You have pain, burning, or bleeding when urinating.  You develop abdominal pain. Document Released: 08/11/2003 Document Revised: 02/04/2012 Document Reviewed: 11/29/2010 ExitCare Patient Information 2014 [employer].  

## 2013-08-27 LAB — FOLLICLE STIMULATING HORMONE: FSH: 10.4 m[IU]/mL

## 2013-08-28 ENCOUNTER — Encounter: Payer: Self-pay | Admitting: Gynecology

## 2013-09-01 ENCOUNTER — Telehealth: Payer: Self-pay | Admitting: *Deleted

## 2013-09-01 NOTE — Telephone Encounter (Signed)
Pt informed with FSH level on 08/26/13

## 2013-09-18 ENCOUNTER — Ambulatory Visit (INDEPENDENT_AMBULATORY_CARE_PROVIDER_SITE_OTHER): Payer: BC Managed Care – PPO | Admitting: Gynecology

## 2013-09-18 ENCOUNTER — Encounter: Payer: Self-pay | Admitting: Gynecology

## 2013-09-18 ENCOUNTER — Ambulatory Visit (INDEPENDENT_AMBULATORY_CARE_PROVIDER_SITE_OTHER): Payer: BC Managed Care – PPO

## 2013-09-18 VITALS — BP 128/86

## 2013-09-18 DIAGNOSIS — R1031 Right lower quadrant pain: Secondary | ICD-10-CM

## 2013-09-18 DIAGNOSIS — N942 Vaginismus: Secondary | ICD-10-CM

## 2013-09-18 DIAGNOSIS — Z8742 Personal history of other diseases of the female genital tract: Secondary | ICD-10-CM

## 2013-09-18 DIAGNOSIS — N951 Menopausal and female climacteric states: Secondary | ICD-10-CM

## 2013-09-18 NOTE — Progress Notes (Signed)
Patient presented to the office today to discuss her ultrasound. Patient received the office October 1 for her new exam see previous note for detail.Patient with prior history of total abdominal hysterectomy and ovarian conservation 2011. Patient had voiced during that office visit that she had been complaining of hot flashes, irritability, and mood swings and occasional night sweats and decreased energy as well as sex drive. We did check her FSH and it was normal. Her primary physician and on her TSH as well as all her other annual lab tests and were normal. Because of her abdominal girth and vaginismus during pelvic exam she was asked to return to the office for an ultrasound.  Ultrasound: Absence of uterus. Both ovaries normal. No free fluid seen.  Assessment/plan: Early perimenopause although FSH is not in the menopausal range. She is not a candidate at this point for hormone replacement therapy. We did discuss over-the-counter nonhormonal natural products such as Relizen which she can call to order by phone. I explained her that this probably has been used over 15 years in your concern to be using [place] to decrease her vasomotor symptoms as well as quality of life fatigue and irritability. We will check her [facility] next year. We discussed importance of good nutrition and regular exercise as well.

## 2013-09-24 ENCOUNTER — Encounter: Payer: BC Managed Care – PPO | Admitting: Physician Assistant

## 2013-11-03 ENCOUNTER — Encounter: Payer: BC Managed Care – PPO | Admitting: Nurse Practitioner

## 2013-12-11 ENCOUNTER — Telehealth: Payer: Self-pay | Admitting: *Deleted

## 2013-12-11 NOTE — Telephone Encounter (Signed)
S/w Lattie Haw @ [facility] @ [phone] will fax requested records over for GXT per Cecille Rubin, wanted an ov and EKG, [facility] order GXT

## 2013-12-15 ENCOUNTER — Encounter: Payer: Self-pay | Admitting: Cardiovascular Disease

## 2013-12-15 ENCOUNTER — Encounter: Payer: Self-pay | Admitting: Nurse Practitioner

## 2013-12-15 ENCOUNTER — Ambulatory Visit (INDEPENDENT_AMBULATORY_CARE_PROVIDER_SITE_OTHER): Payer: BC Managed Care – PPO | Admitting: Nurse Practitioner

## 2013-12-15 VITALS — BP 134/90 | HR 76

## 2013-12-15 DIAGNOSIS — R0602 Shortness of breath: Secondary | ICD-10-CM

## 2013-12-15 NOTE — Progress Notes (Signed)
Exercise Treadmill Test  Pre-Exercise Testing Evaluation Rhythm: normal sinus  Rate: 73 bpm     Test  Exercise Tolerance Test Ordering MD: Mertie Moores, MD  Interpreting MD: Truitt Merle, NP  Unique Test No: 1  Treadmill:  1  Indication for ETT: exertional dyspnea  Contraindication to ETT: No   Stress Modality: exercise - treadmill  Cardiac Imaging Performed: non   Protocol: standard Bruce - maximal  Max BP:  231/88  Max MPHR (bpm):  172 85% MPR (bpm):  146  MPHR obtained (bpm):  146 % MPHR obtained:  85%  Reached 85% MPHR (min:sec):  5 minutes Total Exercise Time (min-sec):  5:15  Workload in METS:  7.0 Borg Scale: 17  Reason ETT Terminated:  patient's desire to stop    ST Segment Analysis At Rest: normal ST segments - no evidence of significant ST depression With Exercise: no evidence of significant ST depression  Other Information Arrhythmia:  No Angina during ETT:  absent (0) Quality of ETT:  diagnostic  ETT Interpretation:  normal - no evidence of ischemia by ST analysis  Comments: Patient presents today for routine GXT. Has had DOE in the setting of morbid obesity.   Today the patient exercised on the standard Bruce protocol for a total of 5:15 minutes.  Reduced exercise tolerance.  Hypertensive blood pressure response.  Clinically negative for chest pain. Test was stopped due to fatigue/dyspnea.  EKG negative for ischemia. No significant arrhythmia noted.   Recommendations: I suspect her dyspnea is more related to deconditioning and obesity. We have discussed the need for CV risk factor modification with diet/weight loss. She was quite hypertensive with her study - I have asked her to monitor her BP at home and touch base with Dr. Philip Aspen. I will defer further medication for HTN to Dr. Philip Aspen.  Patient is agreeable to this plan and will call if any problems develop in the interim.   Burtis Junes, RN, [place] [facility] [address] [place], [place]  [id number] [phone]

## 2014-01-20 ENCOUNTER — Telehealth: Payer: Self-pay | Admitting: Neurology

## 2014-01-20 DIAGNOSIS — G4733 Obstructive sleep apnea (adult) (pediatric): Secondary | ICD-10-CM

## 2014-01-20 DIAGNOSIS — I10 Essential (primary) hypertension: Secondary | ICD-10-CM

## 2014-01-20 NOTE — Telephone Encounter (Addendum)
. °  Dr. Leanna Battles is referring Alexis Wall, 48 y.o. female, for an attended sleep study.  Wt: 246 lbs. Ht: 63.75 in. BMI: 42.56  Diagnoses: Obstructive Sleep Apnea Morbid Obesity Hypertension Insomnia Dyslipidemia Dyspnea  Medication List: Current Outpatient Prescriptions  Medication Sig Dispense Refill   FLUoxetine (PROZAC) 20 MG capsule Take 20 mg by mouth daily.       naproxen (NAPROSYN) 375 MG tablet Take 1 tablet (375 mg total) by mouth 2 (two) times daily.  20 tablet  0   nystatin-triamcinolone ointment (MYCOLOG) APPLY TO THE AFFECTED AREA 2-3 TIMES A DAY  30 g  1   omeprazole (PRILOSEC) 20 MG capsule Take 20 mg by mouth daily.       verapamil (COVERA HS) 180 MG (CO) 24 hr tablet Take 180 mg by mouth at bedtime.       zolpidem (AMBIEN) 10 MG tablet Take 10 mg by mouth at bedtime as needed.       No current facility-administered medications for this visit.   This patient presents to Dr. Leanna Battles for an evaluation regarding elevated blood pressure.  She was diagnosed at [facility] in 2009 with mild osa with an overall AHI of 11.4, Supine AHI of 17.8, and REM AHI of 47.4.  She began using CPAP but has not used it compliantly within the past year because she feels that the machine is so old (7 yrs), it does not feel effective.  She has also not been seen in follow up since beginning CPAP.  She describes that when she uses it now it doesn't feel as though the pressure is high enough, possibly warranting a new sleep study to see if her pressure has changed.  She has been complaining of chronic dyspnea, her bp is high even with medication and exercise, and continues to have weight gain despite increase in exercise.   Insurance:  [employer] - Prior authorization is not required

## 2014-01-26 NOTE — Telephone Encounter (Signed)
For Dr. Philip Aspen.:   SPLIT at insurance limit : AHI 15 and score 3% ?  CO2 needed for morbid obesity.  Patient had mild OSA in 2009 .

## 2014-02-26 ENCOUNTER — Ambulatory Visit (INDEPENDENT_AMBULATORY_CARE_PROVIDER_SITE_OTHER): Payer: BC Managed Care – PPO

## 2014-02-26 DIAGNOSIS — G4733 Obstructive sleep apnea (adult) (pediatric): Secondary | ICD-10-CM

## 2014-02-26 DIAGNOSIS — I10 Essential (primary) hypertension: Secondary | ICD-10-CM

## 2014-03-09 ENCOUNTER — Encounter: Payer: Self-pay | Admitting: *Deleted

## 2014-03-09 ENCOUNTER — Telehealth: Payer: Self-pay | Admitting: Neurology

## 2014-03-09 DIAGNOSIS — G4733 Obstructive sleep apnea (adult) (pediatric): Secondary | ICD-10-CM

## 2014-03-09 NOTE — Telephone Encounter (Signed)
I called and spkoe with the patient about her recent sleep study results. I informed the patient that the study revealed severe obstructive sleep apnea and that Dr. Brett Fairy recommend CPAP therapy at home. I will send the order to [facility], fax a copy of the report to Dr. Kaylyn Lim office and mail a copy to the patient along with a follow up instruction letter.

## 2014-09-27 ENCOUNTER — Encounter: Payer: Self-pay | Admitting: Nurse Practitioner

## 2015-05-18 ENCOUNTER — Other Ambulatory Visit: Payer: Self-pay

## 2015-05-18 DIAGNOSIS — Z1231 Encounter for screening mammogram for malignant neoplasm of breast: Secondary | ICD-10-CM

## 2015-05-20 ENCOUNTER — Ambulatory Visit
Admission: RE | Admit: 2015-05-20 | Discharge: 2015-05-20 | Disposition: A | Discharge status: Home / Self Care | Payer: BLUE CROSS/BLUE SHIELD | Source: Ambulatory Visit

## 2015-05-20 DIAGNOSIS — Z1231 Encounter for screening mammogram for malignant neoplasm of breast: Secondary | ICD-10-CM

## 2015-07-20 ENCOUNTER — Encounter: Payer: Self-pay | Admitting: Gynecology

## 2015-09-26 ENCOUNTER — Emergency Department (HOSPITAL_COMMUNITY)
Admission: EM | Admit: 2015-09-26 | Discharge: 2015-09-26 | Disposition: A | Discharge status: Home / Self Care | Payer: No Typology Code available for payment source | Attending: Emergency Medicine | Admitting: Emergency Medicine

## 2015-09-26 ENCOUNTER — Emergency Department (HOSPITAL_COMMUNITY): Payer: No Typology Code available for payment source

## 2015-09-26 ENCOUNTER — Encounter (HOSPITAL_COMMUNITY): Payer: Self-pay

## 2015-09-26 DIAGNOSIS — Z880 Allergy status to penicillin: Secondary | ICD-10-CM | POA: Insufficient documentation

## 2015-09-26 DIAGNOSIS — Z87891 Personal history of nicotine dependence: Secondary | ICD-10-CM | POA: Insufficient documentation

## 2015-09-26 DIAGNOSIS — K219 Gastro-esophageal reflux disease without esophagitis: Secondary | ICD-10-CM | POA: Diagnosis not present

## 2015-09-26 DIAGNOSIS — I10 Essential (primary) hypertension: Secondary | ICD-10-CM | POA: Diagnosis not present

## 2015-09-26 DIAGNOSIS — Y92410 Unspecified street and highway as the place of occurrence of the external cause: Secondary | ICD-10-CM | POA: Insufficient documentation

## 2015-09-26 DIAGNOSIS — S7002XA Contusion of left hip, initial encounter: Secondary | ICD-10-CM | POA: Insufficient documentation

## 2015-09-26 DIAGNOSIS — Z862 Personal history of diseases of the blood and blood-forming organs and certain disorders involving the immune mechanism: Secondary | ICD-10-CM | POA: Insufficient documentation

## 2015-09-26 DIAGNOSIS — Z9981 Dependence on supplemental oxygen: Secondary | ICD-10-CM | POA: Insufficient documentation

## 2015-09-26 DIAGNOSIS — Y9389 Activity, other specified: Secondary | ICD-10-CM | POA: Diagnosis not present

## 2015-09-26 DIAGNOSIS — M170 Bilateral primary osteoarthritis of knee: Secondary | ICD-10-CM | POA: Diagnosis not present

## 2015-09-26 DIAGNOSIS — Z86010 Personal history of colonic polyps: Secondary | ICD-10-CM | POA: Diagnosis not present

## 2015-09-26 DIAGNOSIS — G4730 Sleep apnea, unspecified: Secondary | ICD-10-CM | POA: Insufficient documentation

## 2015-09-26 DIAGNOSIS — S79912A Unspecified injury of left hip, initial encounter: Secondary | ICD-10-CM | POA: Diagnosis present

## 2015-09-26 DIAGNOSIS — Z8632 Personal history of gestational diabetes: Secondary | ICD-10-CM | POA: Diagnosis not present

## 2015-09-26 DIAGNOSIS — S139XXA Sprain of joints and ligaments of unspecified parts of neck, initial encounter: Secondary | ICD-10-CM | POA: Diagnosis not present

## 2015-09-26 DIAGNOSIS — Y998 Other external cause status: Secondary | ICD-10-CM | POA: Insufficient documentation

## 2015-09-26 DIAGNOSIS — F329 Major depressive disorder, single episode, unspecified: Secondary | ICD-10-CM | POA: Insufficient documentation

## 2015-09-26 MED ORDER — DIAZEPAM 5 MG PO TABS: 5.0000 mg | ORAL_TABLET | Freq: Four times a day (QID) | ORAL | Status: DC | PRN

## 2015-09-26 MED ORDER — IBUPROFEN 200 MG PO TABS
600.0000 mg | ORAL_TABLET | Freq: Once | ORAL | Status: AC
  Administered 2015-09-26: 600 mg via ORAL
  Filled 2015-09-26: qty 3

## 2015-09-26 MED ORDER — NAPROXEN 375 MG PO TABS: 375.0000 mg | ORAL_TABLET | Freq: Two times a day (BID) | ORAL | Status: DC | PRN
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
## 2015-09-26 NOTE — Discharge Instructions (Signed)
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
## 2015-12-29 ENCOUNTER — Encounter: Payer: Self-pay | Admitting: Internal Medicine

## 2016-01-03 ENCOUNTER — Ambulatory Visit: Payer: BLUE CROSS/BLUE SHIELD | Admitting: Gastroenterology

## 2016-02-20 ENCOUNTER — Ambulatory Visit: Payer: BLUE CROSS/BLUE SHIELD | Admitting: Internal Medicine

## 2016-04-02 ENCOUNTER — Encounter: Payer: Self-pay | Admitting: *Deleted

## 2016-04-12 ENCOUNTER — Encounter: Payer: Self-pay | Admitting: Gynecology

## 2016-04-12 ENCOUNTER — Ambulatory Visit (INDEPENDENT_AMBULATORY_CARE_PROVIDER_SITE_OTHER): Payer: BLUE CROSS/BLUE SHIELD | Admitting: Gynecology

## 2016-04-12 VITALS — BP 130/86 | Ht 63.5 in | Wt 240.0 lb

## 2016-04-12 DIAGNOSIS — Z01419 Encounter for gynecological examination (general) (routine) without abnormal findings: Secondary | ICD-10-CM | POA: Diagnosis not present

## 2016-04-12 DIAGNOSIS — N942 Vaginismus: Secondary | ICD-10-CM | POA: Diagnosis not present

## 2016-04-12 NOTE — Progress Notes (Signed)
Alexis Wall 1966/09/23 [id number]   History:    50 y.o.  for annual gyn exam with no complaints today.Patient's primary physician is Dr. Verl Blalock who will be doing her lab work next week. Patient with prior history of total abdominal hysterectomy and ovarian conservation 2011. Patient did have gestational diabetes with her pregnancy. Patient has always had normal Pap smears prior to her hysterectomy. Patient has not been seen the office since 2014.  Due to the fact the patient is overweight and had vaginismus she had an ultrasound back in 2014 and was reported as normal ovaries absent uterus.  Past medical history,surgical history, family history and social history were all reviewed and documented in the EPIC chart.  Gynecologic History No LMP recorded. Patient has had a hysterectomy. Contraception: status post hysterectomy Last Pap: 2011. Results were: normal Last mammogram: 2014. Results were: normal  Obstetric History OB History  Gravida Para Term Preterm AB SAB TAB Ectopic Multiple Living  3 2 2  1 1    2     # Outcome Date GA Lbr Len/2nd Weight Sex Delivery Anes PTL Lv  3 SAB           2 Term     F CS-Unspec  N Y  1 Term     M CS-Unspec  N Y       ROS: A ROS was performed and pertinent positives and negatives are included in the history.  GENERAL: No fevers or chills. HEENT: No change in vision, no earache, sore throat or sinus congestion. NECK: No pain or stiffness. CARDIOVASCULAR: No chest pain or pressure. No palpitations. PULMONARY: No shortness of breath, cough or wheeze. GASTROINTESTINAL: No abdominal pain, nausea, vomiting or diarrhea, melena or bright red blood per rectum. GENITOURINARY: No urinary frequency, urgency, hesitancy or dysuria. MUSCULOSKELETAL: No joint or muscle pain, no back pain, no recent trauma. DERMATOLOGIC: No rash, no itching, no lesions. ENDOCRINE: No polyuria, polydipsia, no heat or cold intolerance. No recent change in weight.  HEMATOLOGICAL: No anemia or easy bruising or bleeding. NEUROLOGIC: No headache, seizures, numbness, tingling or weakness. PSYCHIATRIC: No depression, no loss of interest in normal activity or change in sleep pattern.     Exam: chaperone present  BP 130/86 mmHg  Ht 5' 3.5" (1.613 m)  Wt 240 lb (108.863 kg)  BMI 41.84 kg/m2  Body mass index is 41.84 kg/(m^2).  General appearance : Well developed well nourished female. No acute distress HEENT: Eyes: no retinal hemorrhage or exudates,  Neck supple, trachea midline, no carotid bruits, no thyroidmegaly Lungs: Clear to auscultation, no rhonchi or wheezes, or rib retractions  Heart: Regular rate and rhythm, no murmurs or gallops Breast:Examined in sitting and supine position were symmetrical in appearance, no palpable masses or tenderness,  no skin retraction, no nipple inversion, no nipple discharge, no skin discoloration, no axillary or supraclavicular lymphadenopathy Abdomen: no palpable masses or tenderness, no rebound or guarding Extremities: no edema or skin discoloration or tenderness  Pelvic:  Bartholin, Urethra, Skene Glands: Within normal limits             Vagina: No gross lesions or discharge  Cervix: Absent  Uterus  absent  Adnexa limited due to abdominal girth and vaginismus Anus and perineum  normal   Rectovaginal  normal sphincter tone without palpated masses or tenderness             Hemoccult PCP provides     Assessment/Plan:  50 y.o. female for annual  exam will return back to the office for an ultrasound to better assess her ovaries due to the fact that she is overweight and had vaginismus making it difficult to assess her ovaries. Her PCP is been doing her blood work. Patient has a consult appointment with her gastroenterologist later this month because she had noted some blood in her stool and loose stools as well. Patient had a normal Corn in 2014 she's having no vasomotor symptoms she is on no hormones. She was  encouraged to her mammogram and she was provided with a requisition to schedule her overdue mammogram as well.   Terrance Mass MD, 1:39 PM 04/12/2016

## 2016-04-13 ENCOUNTER — Telehealth: Payer: Self-pay | Admitting: *Deleted

## 2016-04-13 DIAGNOSIS — N644 Mastodynia: Secondary | ICD-10-CM

## 2016-04-13 NOTE — Telephone Encounter (Signed)
Pt called and left message in triage c/o breast tenderness, was seen 04/12/16 in office for annual exam. Pt requesting order to breast center, no note in [facility] office note regarding this. I call and leave a message for pt to call.

## 2016-04-13 NOTE — Telephone Encounter (Signed)
Pt had [facility] this am still notes period cramping and mild bleeding, pt wanted to know if this is to be expected. I advised pt that normal, no fever nor chills. Bleeding is not heavy. Pt said she is going to try tylenol to help with cramps and will follow up if needed.

## 2016-04-18 ENCOUNTER — Other Ambulatory Visit: Payer: BLUE CROSS/BLUE SHIELD

## 2016-04-18 ENCOUNTER — Ambulatory Visit: Payer: BLUE CROSS/BLUE SHIELD | Admitting: Gynecology

## 2016-04-18 NOTE — Telephone Encounter (Signed)
Her mammogram in 2014 because of the same tenderness she has experienced in the past only showed fibroglandular tissue. You can call a diagnostic mammogram for the left upper outer quadrant with a screening mammogram for the right thank you

## 2016-04-18 NOTE — Telephone Encounter (Signed)
Pt informed, orders placed.  

## 2016-04-18 NOTE — Telephone Encounter (Signed)
Dr.Fernandez patient had annual on 04/12/16 states you give her a order for diagnosis mammogram due to left breast at upper-outer quadrant. I didn't see anything in the office note regarding this. Pt is not able to schedule without an order from [facility]ogram in 2014  Okay to schedule?

## 2016-04-20 ENCOUNTER — Ambulatory Visit (INDEPENDENT_AMBULATORY_CARE_PROVIDER_SITE_OTHER): Payer: BLUE CROSS/BLUE SHIELD | Admitting: Internal Medicine

## 2016-04-20 ENCOUNTER — Encounter: Payer: Self-pay | Admitting: Internal Medicine

## 2016-04-20 VITALS — BP 120/90 | HR 76 | Ht 63.5 in | Wt 259.0 lb

## 2016-04-20 DIAGNOSIS — K625 Hemorrhage of anus and rectum: Secondary | ICD-10-CM | POA: Diagnosis not present

## 2016-04-20 DIAGNOSIS — L290 Pruritus ani: Secondary | ICD-10-CM | POA: Diagnosis not present

## 2016-04-20 DIAGNOSIS — R151 Fecal smearing: Secondary | ICD-10-CM | POA: Diagnosis not present

## 2016-04-20 MED ORDER — NA SULFATE-K SULFATE-MG SULF 17.5-3.13-1.6 GM/177ML PO SOLN: ORAL | Status: DC

## 2016-04-20 NOTE — Progress Notes (Signed)
Patient ID: CALAH NEMEC, female   DOB: December 18, 1965, 50 y.o.   MRN: [id number] HPI:  Mistica Deichmann is a 50 year old female with a past medical history of hyperplastic colon polyp, obesity with sleep apnea, hypertension and GERD who is seen in consultation at the request of Dr. Philip Aspen to evaluate fecal smearing, perianal itching and minor rectal bleeding. She is here alone today. She reports that over the last few months she's noticed fecal smearing about 30 minutes after having a bowel movement. This is associated with bleeding, described as a minor amount of red blood. She's also had some perianal itching. For the most part bowel movements have been normal and very rarely if ever hard. If her stools are hard or large she can feel a somewhat sharp pain with passing stool. She denies abdominal pain. Denies diarrhea. Denies melena. Denies hematochezia. She has a history of reflux which she states is well-controlled on omeprazole 20 mg daily. She denies dysphagia or odynophagia. Stable weight. Good appetite. No nausea or vomiting. Family history notable for a father with colon polyps and a paternal uncle with colon polyps. Her mother has had issues with diverticulitis.  She has had 3 previous colonoscopies performed by Dr. Sharlett Iles. The first was performed in November 2005. This was performed for hematochezia. A 4 mm sessile polyp was removed from the sigmoid. This was found to be hyperplastic. There was no noted hemorrhoids on this exam. Second colonoscopy was performed September 2008 which was normal. This was also performed for hematochezia. Last colonoscopy was performed 09/08/2012 performed for screening. This was normal with normal retroflexion.   Past Medical History  Diagnosis Date  . Polyp of colon   . GERD (gastroesophageal reflux disease)   . Hypertension   . Depression     [facility]  . Anemia   . 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 Morbid obesity ([place])   . Sleep apnea     Wears CPAP  .  Diabetes mellitus     GESTATIONAL-not now  . Bipolar disorder ([place])     per Dr D.Patterson's office note 07/08/2007    Past Surgical History  Procedure Laterality Date  . Endometrial ablation  08/25/2009    HER OPTION   . Cesarean section  01/26/1999, 09/30/2002    X2.Marland Kitchen WITH BTSP  . Abdominal hysterectomy  03/21/2010    TAH  . Breath tek h pylori  08/05/2012    Procedure: BREATH TEK H PYLORI;  Surgeon: Pedro Earls, MD;  Location: [facility];  Service: General;  Laterality: N/A;  745    Outpatient Prescriptions Prior to Visit  Medication Sig Dispense Refill  . FLUoxetine (PROZAC) 40 MG capsule Take 40 mg by mouth at bedtime.    Marland Kitchen ibuprofen (ADVIL,MOTRIN) 200 MG tablet Take 200-400 mg by mouth every 6 (six) hours as needed (For pain from dental procedure.).    Marland Kitchen omeprazole (PRILOSEC) 20 MG capsule Take 20 mg by mouth at bedtime.     . verapamil (CALAN-SR) 180 MG CR tablet Take 180 mg by mouth at bedtime.    Marland Kitchen zolpidem (AMBIEN) 10 MG tablet Take 10 mg by mouth at bedtime.     . diazepam (VALIUM) 5 MG tablet Take 1 tablet (5 mg total) by mouth every 6 (six) hours as needed for muscle spasms. (Patient not taking: Reported on 04/12/2016) 10 tablet 0  . naproxen (NAPROSYN) 375 MG tablet Take 1 tablet (375 mg total) by mouth 2 (two) times  daily as needed. 20 tablet 0  . nystatin-triamcinolone ointment (MYCOLOG) APPLY TO THE AFFECTED AREA 2-3 TIMES A DAY (Patient not taking: Reported on 09/26/2015) 30 g 1   No facility-administered medications prior to visit.    Allergies  Allergen Reactions  . Penicillins Rash    Happened in childhood, does not remember if it spread all over her body or not. Has patient had a PCN reaction causing immediate rash, facial/tongue/throat swelling, SOB or lightheadedness with hypotension: no Has patient had a PCN reaction causing severe rash involving mucus membranes or skin necrosis: no Has patient had a PCN reaction that required hospitalization no Has  patient had a PCN reaction occurring within the last 10 years: no If all of the above answers are "NO", then may proceed with C    Family History  Problem Relation Age of Onset  . Hypertension Father   . Obesity Father   . Heart disease Paternal Grandfather   . Cancer Maternal Grandfather     colon  . Sleep apnea Mother   . Colon cancer Neg Hx     Social History  Substance Use Topics  . Smoking status: Former Smoker    Quit date: 05/03/1979  . Smokeless tobacco: Never Used  . Alcohol Use: 0.6 oz/week    1 Glasses of wine per week     Comment: occasional glass of wine once per week    ROS: As per history of present illness, otherwise negative  BP 120/90 mmHg  Pulse 76  Ht 5' 3.5" (1.613 m)  Wt 259 lb (117.482 kg)  BMI 45.15 kg/m2 Constitutional: Well-developed and well-nourished. No distress. HEENT: Normocephalic and atraumatic. Oropharynx is clear and moist. No oropharyngeal exudate. Conjunctivae are normal.  No scleral icterus. Neck: Neck supple. Trachea midline. Cardiovascular: Normal rate, regular rhythm and intact distal pulses. No M/R/G Pulmonary/chest: Effort normal and breath sounds normal. No wheezing, rales or rhonchi. Abdominal: Soft, obese, nontender, nondistended. Bowel sounds active throughout. There are no masses palpable. No hepatosplenomegaly. Extremities: no clubbing, cyanosis, or edema Neurological: Alert and oriented to person place and time. Skin: Skin is warm and dry.  Psychiatric: Normal mood and affect. Behavior is normal.  ASSESSMENT/PLAN: 50 year old female with a past medical history of hyperplastic colon polyp, obesity with sleep apnea, hypertension and GERD who is seen in consultation at the request of Dr. Philip Aspen to evaluate fecal smearing, perianal itching and minor rectal bleeding.   1. Rectal bleeding/fecal smearing/perianal itching -- all symptoms are consistent with internal hemorrhoids. I recommended anoscopy today and then  hemorrhoidal treatment with hydrocortisone suppositories versus banding. She reports feeling anxious about her symptoms and would prefer to rule out more ominous pathology through colonoscopy. We had a thorough discussion for the risks, benefits and alternatives she prefers to proceed with colonoscopy. If internal hemorrhoids are seen we can discuss medical therapy versus banding. She was given banding later today. Further recommendations after procedure  [id number] Philip Aspen, [place], [place] [address]

## 2016-04-20 NOTE — Patient Instructions (Signed)
You have been scheduled for a colonoscopy. Please follow written instructions given to you at your visit today.  Please pick up your prep supplies at the pharmacy within the next 1-3 days. If you use inhalers (even only as needed), please bring them with you on the day of your procedure. Your physician has requested that you go to www.startemmi.com and enter the access code given to you at your visit today. This web site gives a general overview about your procedure. However, you should still follow specific instructions given to you by our office regarding your preparation for the procedure.  If you are age 32 or older, your body mass index should be between 23-30. Your Body mass index is 45.15 kg/(m^2). If this is out of the aforementioned range listed, please consider follow up with your Primary Care Provider.  If you are age 83 or younger, your body mass index should be between 19-25. Your Body mass index is 45.15 kg/(m^2). If this is out of the aformentioned range listed, please consider follow up with your Primary Care Provider.

## 2016-04-24 ENCOUNTER — Encounter: Payer: Self-pay | Admitting: Internal Medicine

## 2016-04-26 ENCOUNTER — Other Ambulatory Visit: Payer: Self-pay | Admitting: Gynecology

## 2016-04-26 ENCOUNTER — Ambulatory Visit
Admission: RE | Admit: 2016-04-26 | Discharge: 2016-04-26 | Disposition: A | Discharge status: Home / Self Care | Payer: BLUE CROSS/BLUE SHIELD | Source: Ambulatory Visit | Attending: Gynecology | Admitting: Gynecology

## 2016-04-26 DIAGNOSIS — N644 Mastodynia: Secondary | ICD-10-CM

## 2016-04-30 ENCOUNTER — Ambulatory Visit: Payer: BLUE CROSS/BLUE SHIELD | Admitting: Gynecology

## 2016-04-30 ENCOUNTER — Other Ambulatory Visit: Payer: BLUE CROSS/BLUE SHIELD

## 2016-05-02 ENCOUNTER — Encounter: Payer: BLUE CROSS/BLUE SHIELD | Admitting: Internal Medicine

## 2016-05-07 ENCOUNTER — Encounter: Payer: Self-pay | Admitting: Internal Medicine

## 2016-05-07 ENCOUNTER — Ambulatory Visit (AMBULATORY_SURGERY_CENTER): Payer: BLUE CROSS/BLUE SHIELD | Admitting: Internal Medicine

## 2016-05-07 VITALS — BP 112/66 | HR 68 | Temp 97.7°F | Resp 15 | Ht 63.0 in | Wt 259.0 lb

## 2016-05-07 DIAGNOSIS — D120 Benign neoplasm of cecum: Secondary | ICD-10-CM | POA: Diagnosis not present

## 2016-05-07 DIAGNOSIS — D127 Benign neoplasm of rectosigmoid junction: Secondary | ICD-10-CM | POA: Diagnosis not present

## 2016-05-07 DIAGNOSIS — K625 Hemorrhage of anus and rectum: Secondary | ICD-10-CM

## 2016-05-07 DIAGNOSIS — K635 Polyp of colon: Secondary | ICD-10-CM | POA: Diagnosis not present

## 2016-05-07 MED ORDER — HYDROCORTISONE ACETATE 25 MG RE SUPP: RECTAL | Status: DC

## 2016-05-07 MED ORDER — SODIUM CHLORIDE 0.9 % IV SOLN: 500.0000 mL | INTRAVENOUS | Status: DC

## 2016-05-07 NOTE — Patient Instructions (Addendum)
Colon polyps removed today. Handout given on polyps,diverticulosis and hemorrhoids.  Hydrocortisone suppository 25 mg 1 per rectum twice a day for 5 days. Pick up from your pharmacy. RectiCare can be used every 6-8 hours as needed for itching or pain.  If hemorrhoid symptoms fail to improve banding may be needed. Handout given on banding.  Result letter in your mail in 2-3 weeks.  Resume current medications.  Call us with any questions or concerns. Thank you!!   YOU HAD AN ENDOSCOPIC PROCEDURE TODAY AT [facility]:   Refer to the procedure report that was given to you for any specific questions about what was found during the examination.  If the procedure report does not answer your questions, please call your gastroenterologist to clarify.  If you requested that your care partner not be given the details of your procedure findings, then the procedure report has been included in a sealed envelope for you to review at your convenience later.  YOU SHOULD EXPECT: Some feelings of bloating in the abdomen. Passage of more gas than usual.  Walking can help get rid of the air that was put into your GI tract during the procedure and reduce the bloating. If you had a lower endoscopy (such as a colonoscopy or flexible sigmoidoscopy) you may notice spotting of blood in your stool or on the toilet paper. If you underwent a bowel prep for your procedure, you may not have a normal bowel movement for a few days.  Please Note:  You might notice some irritation and congestion in your nose or some drainage.  This is from the oxygen used during your procedure.  There is no need for concern and it should clear up in a day or so.  SYMPTOMS TO REPORT IMMEDIATELY:   Following lower endoscopy (colonoscopy or flexible sigmoidoscopy):  Excessive amounts of blood in the stool  Significant tenderness or worsening of abdominal pains  Swelling of the abdomen that is new, acute  Fever of 100F or  higher   For urgent or emergent issues, a gastroenterologist can be reached at any hour by calling [phone].   DIET: Your first meal following the procedure should be a small meal and then it is ok to progress to your normal diet. Heavy or fried foods are harder to digest and may make you feel nauseous or bloated.  Likewise, meals heavy in dairy and vegetables can increase bloating.  Drink plenty of fluids but you should avoid alcoholic beverages for 24 hours.  ACTIVITY:  You should plan to take it easy for the rest of today and you should NOT DRIVE or use heavy machinery until tomorrow (because of the sedation medicines used during the test).    FOLLOW UP: Our staff will call the number listed on your records the next business day following your procedure to check on you and address any questions or concerns that you may have regarding the information given to you following your procedure. If we do not reach you, we will leave a message.  However, if you are feeling well and you are not experiencing any problems, there is no need to return our call.  We will assume that you have returned to your regular daily activities without incident.  If any biopsies were taken you will be contacted by phone or by letter within the next 1-3 weeks.  Please call us at [phone] if you have not heard about the biopsies in 3 weeks.  SIGNATURES/CONFIDENTIALITY: You and/or your care partner have signed paperwork which will be entered into your electronic medical record.  These signatures attest to the fact that that the information above on your After Visit Summary has been reviewed and is understood.  Full responsibility of the confidentiality of this discharge information lies with you and/or your care-partner.

## 2016-05-07 NOTE — Progress Notes (Signed)
Called to room to assist during endoscopic procedure.  Patient ID and intended procedure confirmed with present staff. Received instructions for my participation in the procedure from the performing physician.  

## 2016-05-07 NOTE — Op Note (Addendum)
[facility] Patient Name: Alexis Wall Procedure Date: 05/07/2016 3:31 PM MRN: [id number] Endoscopist: Jerene Bears , MD Age: 50 Referring MD:  Date of Birth: [birth year] Gender: Female Procedure:                Colonoscopy Indications:              Rectal bleeding, fecal smearing, perianal itching Medicines:                Monitored Anesthesia Care Procedure:                Pre-Anesthesia Assessment:                           - Prior to the procedure, a History and Physical                            was performed, and patient medications and                            allergies were reviewed. The patient's tolerance of                            previous anesthesia was also reviewed. The risks                            and benefits of the procedure and the sedation                            options and risks were discussed with the patient.                            All questions were answered, and informed consent                            was obtained. Prior Anticoagulants: The patient has                            taken no previous anticoagulant or antiplatelet                            agents. ASA Grade Assessment: III - A patient with                            severe systemic disease. After reviewing the risks                            and benefits, the patient was deemed in                            satisfactory condition to undergo the procedure.                           After obtaining informed consent, the colonoscope  was passed under direct vision. Throughout the                            procedure, the patient's blood pressure, pulse, and                            oxygen saturations were monitored continuously. The                            Model CF-HQ190L [id number]) scope was introduced                            through the anus and advanced to the the cecum,                            identified by appendiceal orifice  and ileocecal                            valve. The colonoscopy was performed without                            difficulty. The patient tolerated the procedure                            well. The quality of the bowel preparation was                            good. The ileocecal valve, appendiceal orifice, and                            rectum were photographed. Scope In: 3:41:35 PM Scope Out: 3:53:58 PM Scope Withdrawal Time: 0 hours 9 minutes 29 seconds  Total Procedure Duration: 0 hours 12 minutes 23 seconds  Findings:                 The perianal exam findings include non-thrombosed                            internal hemorrhoids and a skin tear of the                            perianal skin in the intragluteal cleft.                           A 4 mm polyp was found in the cecum. The polyp was                            sessile. The polyp was removed with a cold snare.                            Resection and retrieval were complete.                           Two sessile polyps were found in the  recto-sigmoid                            colon. The polyps were 3 to 5 mm in size. These                            polyps were removed with a cold snare. Resection                            and retrieval were complete.                           A few small-mouthed diverticula were found in the                            descending colon and hepatic flexure.                           Internal hemorrhoids were found during                            retroflexion. The hemorrhoids were small. Complications:            No immediate complications. Estimated Blood Loss:     Estimated blood loss: none. Impression:               - Non-thrombosed internal hemorrhoids found on                            perianal exam along with a superficial skin tear in                            the intragluteal cleft.                           - One 4 mm polyp in the cecum, removed with a cold                             snare. Resected and retrieved.                           - Two 3 to 5 mm polyps at the recto-sigmoid colon,                            removed with a cold snare. Resected and retrieved.                           - Mild diverticulosis in the descending colon and                            at the hepatic flexure.                           - Internal hemorrhoids. Recommendation:           - Patient has a contact number  available for                            emergencies. The signs and symptoms of potential                            delayed complications were discussed with the                            patient. Return to normal activities tomorrow.                            Written discharge instructions were provided to the                            patient.                           - Resume previous diet.                           - Continue present medications.                           - Await pathology results.                           - Repeat colonoscopy is recommended. The                            colonoscopy date will be determined after pathology                            results from today's exam become available for                            review.                           - Use hydrocortisone suppository 25 mg 2 per rectum                            once a day for 5 days.                           - RectiCare can be used every 6-8 hours as needed                            for perianal pain/itching.                           - If hemorrhoidal symptoms fail to improve                            hemorrhoidal banding appointment can be scheduled. Jerene Bears, MD 05/07/2016 4:04:23 PM This report has been signed electronically. Addendum Number: 1  Addendum Date: 05/07/2016 4:48:35 PM      It is noted the hydrocortisone suppository dose in 25 mg BID (rather       than 50 mg once daily). Jerene Bears, MD 05/07/2016 4:49:04 PM This report has been signed  electronically.

## 2016-05-07 NOTE — Progress Notes (Signed)
Report given to PACU RN, vss 

## 2016-05-08 ENCOUNTER — Telehealth: Payer: Self-pay | Admitting: *Deleted

## 2016-05-08 ENCOUNTER — Telehealth: Payer: Self-pay | Admitting: Internal Medicine

## 2016-05-08 DIAGNOSIS — D120 Benign neoplasm of cecum: Secondary | ICD-10-CM

## 2016-05-08 DIAGNOSIS — K625 Hemorrhage of anus and rectum: Secondary | ICD-10-CM

## 2016-05-08 DIAGNOSIS — D127 Benign neoplasm of rectosigmoid junction: Secondary | ICD-10-CM

## 2016-05-08 MED ORDER — HYDROCORTISONE ACETATE 25 MG RE SUPP: RECTAL | Status: DC

## 2016-05-08 NOTE — Telephone Encounter (Signed)
  Follow up Call-  Call back number 05/07/2016  Post procedure Call Back phone  # [phone]  Permission to leave phone message Yes     Patient questions:  Message left to call if necessary.

## 2016-05-08 NOTE — Telephone Encounter (Signed)
Rx sent to [facility] per patient request. I advised that the prescription may not be much cheaper as rx still has to go through insurance. However, we will send rx just in case. Per Dr Hilarie Fredrickson, no reason for rx to be sent as DAW. Generic sent to pharmacy.

## 2016-05-10 ENCOUNTER — Encounter: Payer: Self-pay | Admitting: Internal Medicine

## 2016-05-14 ENCOUNTER — Ambulatory Visit: Payer: BLUE CROSS/BLUE SHIELD | Admitting: Gynecology

## 2016-05-14 ENCOUNTER — Other Ambulatory Visit: Payer: BLUE CROSS/BLUE SHIELD

## 2016-07-20 DIAGNOSIS — J069 Acute upper respiratory infection, unspecified: Secondary | ICD-10-CM | POA: Diagnosis not present

## 2016-07-20 DIAGNOSIS — R0602 Shortness of breath: Secondary | ICD-10-CM | POA: Diagnosis not present

## 2016-07-20 DIAGNOSIS — R6889 Other general symptoms and signs: Secondary | ICD-10-CM | POA: Diagnosis not present

## 2016-12-19 DIAGNOSIS — Z23 Encounter for immunization: Secondary | ICD-10-CM | POA: Diagnosis not present

## 2017-04-10 ENCOUNTER — Encounter: Payer: Self-pay | Admitting: Gynecology

## 2017-04-18 DIAGNOSIS — Z6841 Body Mass Index (BMI) 40.0 and over, adult: Secondary | ICD-10-CM | POA: Diagnosis not present

## 2017-04-18 DIAGNOSIS — H6693 Otitis media, unspecified, bilateral: Secondary | ICD-10-CM | POA: Diagnosis not present

## 2017-04-18 DIAGNOSIS — Z1389 Encounter for screening for other disorder: Secondary | ICD-10-CM | POA: Diagnosis not present

## 2017-05-02 DIAGNOSIS — M25562 Pain in left knee: Secondary | ICD-10-CM | POA: Diagnosis not present

## 2017-05-02 DIAGNOSIS — F329 Major depressive disorder, single episode, unspecified: Secondary | ICD-10-CM | POA: Diagnosis not present

## 2017-05-02 DIAGNOSIS — K219 Gastro-esophageal reflux disease without esophagitis: Secondary | ICD-10-CM | POA: Diagnosis not present

## 2017-05-07 DIAGNOSIS — M25562 Pain in left knee: Secondary | ICD-10-CM | POA: Diagnosis not present

## 2017-05-20 DIAGNOSIS — M25462 Effusion, left knee: Secondary | ICD-10-CM | POA: Diagnosis not present

## 2017-05-20 DIAGNOSIS — M25562 Pain in left knee: Secondary | ICD-10-CM | POA: Diagnosis not present

## 2017-06-10 DIAGNOSIS — M1712 Unilateral primary osteoarthritis, left knee: Secondary | ICD-10-CM | POA: Diagnosis not present

## 2017-06-10 DIAGNOSIS — S83282A Other tear of lateral meniscus, current injury, left knee, initial encounter: Secondary | ICD-10-CM | POA: Diagnosis not present

## 2017-06-10 DIAGNOSIS — M25562 Pain in left knee: Secondary | ICD-10-CM | POA: Diagnosis not present

## 2017-06-10 DIAGNOSIS — M25462 Effusion, left knee: Secondary | ICD-10-CM | POA: Diagnosis not present

## 2017-06-13 DIAGNOSIS — M25462 Effusion, left knee: Secondary | ICD-10-CM | POA: Diagnosis not present

## 2017-06-13 DIAGNOSIS — M1712 Unilateral primary osteoarthritis, left knee: Secondary | ICD-10-CM | POA: Diagnosis not present

## 2017-06-13 DIAGNOSIS — M25562 Pain in left knee: Secondary | ICD-10-CM | POA: Diagnosis not present

## 2017-06-13 DIAGNOSIS — S83242A Other tear of medial meniscus, current injury, left knee, initial encounter: Secondary | ICD-10-CM | POA: Diagnosis not present

## 2017-07-24 DIAGNOSIS — M1712 Unilateral primary osteoarthritis, left knee: Secondary | ICD-10-CM | POA: Diagnosis not present

## 2017-07-24 DIAGNOSIS — S83282D Other tear of lateral meniscus, current injury, left knee, subsequent encounter: Secondary | ICD-10-CM | POA: Diagnosis not present

## 2017-07-24 DIAGNOSIS — M25562 Pain in left knee: Secondary | ICD-10-CM | POA: Diagnosis not present

## 2017-07-30 DIAGNOSIS — M1712 Unilateral primary osteoarthritis, left knee: Secondary | ICD-10-CM | POA: Diagnosis not present

## 2017-12-07 DIAGNOSIS — Z23 Encounter for immunization: Secondary | ICD-10-CM | POA: Diagnosis not present

## 2018-05-13 ENCOUNTER — Other Ambulatory Visit: Payer: Self-pay | Admitting: Internal Medicine

## 2018-05-13 DIAGNOSIS — M545 Low back pain: Secondary | ICD-10-CM | POA: Diagnosis not present

## 2018-05-13 DIAGNOSIS — R1011 Right upper quadrant pain: Secondary | ICD-10-CM

## 2018-05-13 DIAGNOSIS — Z6841 Body Mass Index (BMI) 40.0 and over, adult: Secondary | ICD-10-CM | POA: Diagnosis not present

## 2018-05-13 DIAGNOSIS — Z1389 Encounter for screening for other disorder: Secondary | ICD-10-CM | POA: Diagnosis not present

## 2018-05-28 ENCOUNTER — Other Ambulatory Visit: Payer: BLUE CROSS/BLUE SHIELD

## 2018-06-06 ENCOUNTER — Other Ambulatory Visit: Payer: BLUE CROSS/BLUE SHIELD

## 2018-06-13 ENCOUNTER — Ambulatory Visit
Admission: RE | Admit: 2018-06-13 | Discharge: 2018-06-13 | Disposition: A | Discharge status: Home / Self Care | Payer: BLUE CROSS/BLUE SHIELD | Source: Ambulatory Visit | Attending: Internal Medicine | Admitting: Internal Medicine

## 2018-06-13 DIAGNOSIS — R1011 Right upper quadrant pain: Secondary | ICD-10-CM | POA: Diagnosis not present

## 2018-08-15 ENCOUNTER — Ambulatory Visit: Payer: Self-pay | Admitting: General Surgery

## 2018-08-15 ENCOUNTER — Encounter (HOSPITAL_COMMUNITY): Payer: Self-pay | Admitting: *Deleted

## 2018-08-15 DIAGNOSIS — K8044 Calculus of bile duct with chronic cholecystitis without obstruction: Secondary | ICD-10-CM | POA: Diagnosis not present

## 2018-08-22 NOTE — Patient Instructions (Addendum)
ELLERIE ARENZ  08/22/2018   Your procedure is scheduled on: 08-27-18     Report to [facility] Main  Entrance    Report to admitting at 6:30  AM    Call this number if you have problems the morning of surgery [phone]     Remember: Do not eat food or drink liquids :After Midnight.   NO SOLID FOOD AFTER MIDNIGHT THE NIGHT PRIOR TO SURGERY. NOTHING BY MOUTH EXCEPT CLEAR LIQUIDS UNTIL 3 HOURS PRIOR TO [place] SURGERY. PLEASE FINISH ENSURE DRINK PER SURGEON ORDER 3 HOURS PRIOR TO SCHEDULED SURGERY TIME WHICH NEEDS TO BE COMPLETED AT 5:30 .    CLEAR LIQUID DIET   Foods Allowed                                                                     Foods Excluded  Coffee and tea, regular and decaf                             liquids that you cannot  Plain Jell-O in any flavor                                             see through such as: Fruit ices (not with fruit pulp)                                     milk, soups, orange juice  Iced Popsicles                                    All solid food Carbonated beverages, regular and diet                                    Cranberry, grape and apple juices Sports drinks like Gatorade Lightly seasoned clear broth or consume(fat free) Sugar, honey syrup  Sample Menu Breakfast                                Lunch                                     Supper Cranberry juice                    Beef broth                            Chicken broth Jell-O  Grape juice                           Apple juice Coffee or tea                        Jell-O                                      Popsicle                                                Coffee or tea                        Coffee or tea  _____________________________________________________________________    BRUSH YOUR TEETH MORNING OF SURGERY AND RINSE YOUR MOUTH OUT, NO CHEWING GUM CANDY OR MINTS.     Take these medicines  the morning of surgery with A SIP OF WATER: None                               You may not have any metal on your body including hair pins and              piercings  Do not wear jewelry, make-up, lotions, powders or perfumes, deodorant             Do not wear nail polish.  Do not shave  48 hours prior to surgery.            .   Do not bring valuables to the hospital. [place].  Contacts, dentures or bridgework may not be worn into surgery.      Patients discharged the day of surgery will not be allowed to drive home.  Name and phone number of your driver: Winona Sison [phone]  Special Instructions:Please bring your mask and tubing for your CPAP machine              Please read over the following fact sheets you were given: _____________________________________________________________________             [facility] - Preparing for Surgery Before surgery, you can play an important role.  Because skin is not sterile, your skin needs to be as free of germs as possible.  You can reduce the number of germs on your skin by washing with CHG (chlorahexidine gluconate) soap before surgery.  CHG is an antiseptic cleaner which kills germs and bonds with the skin to continue killing germs even after washing. Please DO NOT use if you have an allergy to CHG or antibacterial soaps.  If your skin becomes reddened/irritated stop using the CHG and inform your nurse when you arrive at Short Stay. Do not shave (including legs and underarms) for at least 48 hours prior to the first CHG shower.  You may shave your face/neck. Please follow these instructions carefully:  1.  Shower with CHG Soap the night before surgery and the  morning of Surgery.  2.  If you choose to wash your hair, wash your  hair first as usual with your  normal  shampoo.  3.  After you shampoo, rinse your hair and body thoroughly to remove the  shampoo.                           4.  Use  CHG as you would any other liquid soap.  You can apply chg directly  to the skin and wash                       Gently with a scrungie or clean washcloth.  5.  Apply the CHG Soap to your body ONLY FROM THE NECK DOWN.   Do not use on face/ open                           Wound or open sores. Avoid contact with eyes, ears mouth and genitals (private parts).                       Wash face,  Genitals (private parts) with your normal soap.             6.  Wash thoroughly, paying special attention to the area where your surgery  will be performed.  7.  Thoroughly rinse your body with warm water from the neck down.  8.  DO NOT shower/wash with your normal soap after using and rinsing off  the CHG Soap.                9.  Pat yourself dry with a clean towel.            10.  Wear clean pajamas.            11.  Place clean sheets on your bed the night of your first shower and do not  sleep with pets. Day of Surgery : Do not apply any lotions/deodorants the morning of surgery.  Please wear clean clothes to the hospital/surgery center.  FAILURE TO FOLLOW THESE INSTRUCTIONS MAY RESULT IN THE CANCELLATION OF YOUR SURGERY PATIENT SIGNATURE_________________________________  NURSE SIGNATURE__________________________________  ________________________________________________________________________

## 2018-08-25 ENCOUNTER — Encounter (HOSPITAL_COMMUNITY): Payer: Self-pay

## 2018-08-25 ENCOUNTER — Other Ambulatory Visit: Payer: Self-pay

## 2018-08-25 ENCOUNTER — Encounter (HOSPITAL_COMMUNITY)
Admission: RE | Admit: 2018-08-25 | Discharge: 2018-08-25 | Disposition: A | Discharge status: Home / Self Care | Payer: BLUE CROSS/BLUE SHIELD | Source: Ambulatory Visit | Attending: General Surgery | Admitting: General Surgery

## 2018-08-25 DIAGNOSIS — Z01818 Encounter for other preprocedural examination: Secondary | ICD-10-CM | POA: Insufficient documentation

## 2018-08-25 LAB — CBC WITH DIFFERENTIAL/PLATELET
Basophils Absolute: 0 10*3/uL (ref 0.0–0.1)
Basophils Relative: 0 %
Eosinophils Absolute: 0.3 10*3/uL (ref 0.0–0.7)
Eosinophils Relative: 4 %
HCT: 42.4 % (ref 36.0–46.0)
Hemoglobin: 13.7 g/dL (ref 12.0–15.0)
Lymphocytes Relative: 24 %
Lymphs Abs: 2.1 10*3/uL (ref 0.7–4.0)
MCH: 28.4 pg (ref 26.0–34.0)
MCHC: 32.3 g/dL (ref 30.0–36.0)
MCV: 87.8 fL (ref 78.0–100.0)
Monocytes Absolute: 1.1 10*3/uL — ABNORMAL HIGH (ref 0.1–1.0)
Monocytes Relative: 12 %
Neutro Abs: 5.4 10*3/uL (ref 1.7–7.7)
Neutrophils Relative %: 60 %
Platelets: 367 10*3/uL (ref 150–400)
RBC: 4.83 MIL/uL (ref 3.87–5.11)
RDW: 13.6 % (ref 11.5–15.5)
WBC: 8.9 10*3/uL (ref 4.0–10.5)

## 2018-08-25 LAB — COMPREHENSIVE METABOLIC PANEL
ALT: 20 U/L (ref 0–44)
AST: 21 U/L (ref 15–41)
Albumin: 3.7 g/dL (ref 3.5–5.0)
Alkaline Phosphatase: 81 U/L (ref 38–126)
Anion gap: 10 (ref 5–15)
BUN: 9 mg/dL (ref 6–20)
CO2: 26 mmol/L (ref 22–32)
Calcium: 9.4 mg/dL (ref 8.9–10.3)
Chloride: 105 mmol/L (ref 98–111)
Creatinine, Ser: 0.68 mg/dL (ref 0.44–1.00)
GFR calc Af Amer: >60 mL/min (ref >60)
GFR calc non Af Amer: >60 mL/min (ref >60)
Glucose, Bld: 87 mg/dL (ref 70–99)
Potassium: 4.3 mmol/L (ref 3.5–5.1)
Sodium: 141 mmol/L (ref 135–145)
Total Bilirubin: 0.4 mg/dL (ref 0.3–1.2)
Total Protein: 7.4 g/dL (ref 6.5–8.1)

## 2018-08-25 LAB — LIPASE, BLOOD: Lipase: 45 U/L (ref 11–51)

## 2018-08-26 LAB — HEMOGLOBIN A1C
Hgb A1c MFr Bld: 6.2 % — ABNORMAL HIGH (ref 4.8–5.6)
Mean Plasma Glucose: 131 mg/dL

## 2018-08-26 NOTE — Anesthesia Preprocedure Evaluation (Addendum)
Anesthesia Evaluation  Patient identified by MRN, date of birth, ID band Patient awake    Reviewed: Allergy & Precautions, H&P , NPO status , Patient's Chart, lab work & pertinent test results, reviewed documented beta blocker date and time   Airway Mallampati: III  TM Distance: >3 FB Neck ROM: full    Dental no notable dental hx. (+) Teeth Intact   Pulmonary sleep apnea and Continuous Positive Airway Pressure Ventilation , former smoker,    Pulmonary exam normal breath sounds clear to auscultation       Cardiovascular Exercise Tolerance: Good hypertension, negative cardio ROS   Rhythm:regular Rate:Normal     Neuro/Psych negative neurological ROS  negative psych ROS   GI/Hepatic Neg liver ROS, GERD  Medicated,  Endo/Other  Morbid obesity  Renal/GU negative Renal ROS  negative genitourinary   Musculoskeletal   Abdominal   Peds  Hematology negative hematology ROS (+) anemia ,   Anesthesia Other Findings   Reproductive/Obstetrics negative OB ROS                            Anesthesia Physical Anesthesia Plan  ASA: II  Anesthesia Plan: General   Post-op Pain Management:    Induction: Intravenous  PONV Risk Score and Plan: 3 and Ondansetron, Dexamethasone and Treatment may vary due to age or medical condition  Airway Management Planned: Oral ETT  Additional Equipment:   Intra-op Plan:   Post-operative Plan: Extubation in OR  Informed Consent: I have reviewed the patients History and Physical, chart, labs and discussed the procedure including the risks, benefits and alternatives for the proposed anesthesia with the patient or authorized representative who has indicated his/her understanding and acceptance.   Dental Advisory Given  Plan Discussed with: CRNA, Anesthesiologist and Surgeon  Anesthesia Plan Comments: (  )       Anesthesia Quick Evaluation

## 2018-08-27 ENCOUNTER — Ambulatory Visit (HOSPITAL_COMMUNITY): Payer: BLUE CROSS/BLUE SHIELD | Admitting: Anesthesiology

## 2018-08-27 ENCOUNTER — Encounter (HOSPITAL_COMMUNITY)
Admission: RE | Disposition: A | Discharge status: Home / Self Care | Payer: Self-pay | Source: Ambulatory Visit | Attending: General Surgery

## 2018-08-27 ENCOUNTER — Ambulatory Visit (HOSPITAL_COMMUNITY)
Admission: RE | Admit: 2018-08-27 | Discharge: 2018-08-30 | Disposition: A | Discharge status: Home / Self Care | Payer: BLUE CROSS/BLUE SHIELD | Source: Ambulatory Visit | Attending: General Surgery | Admitting: General Surgery

## 2018-08-27 ENCOUNTER — Ambulatory Visit (HOSPITAL_COMMUNITY): Payer: BLUE CROSS/BLUE SHIELD

## 2018-08-27 ENCOUNTER — Other Ambulatory Visit: Payer: Self-pay

## 2018-08-27 ENCOUNTER — Encounter (HOSPITAL_COMMUNITY): Payer: Self-pay | Admitting: Certified Registered Nurse Anesthetist

## 2018-08-27 DIAGNOSIS — Z79899 Other long term (current) drug therapy: Secondary | ICD-10-CM | POA: Insufficient documentation

## 2018-08-27 DIAGNOSIS — M1990 Unspecified osteoarthritis, unspecified site: Secondary | ICD-10-CM | POA: Insufficient documentation

## 2018-08-27 DIAGNOSIS — I10 Essential (primary) hypertension: Secondary | ICD-10-CM | POA: Diagnosis not present

## 2018-08-27 DIAGNOSIS — D649 Anemia, unspecified: Secondary | ICD-10-CM | POA: Diagnosis present

## 2018-08-27 DIAGNOSIS — R52 Pain, unspecified: Secondary | ICD-10-CM

## 2018-08-27 DIAGNOSIS — K8000 Calculus of gallbladder with acute cholecystitis without obstruction: Secondary | ICD-10-CM | POA: Insufficient documentation

## 2018-08-27 DIAGNOSIS — N800 Endometriosis of uterus: Secondary | ICD-10-CM | POA: Diagnosis not present

## 2018-08-27 DIAGNOSIS — K219 Gastro-esophageal reflux disease without esophagitis: Secondary | ICD-10-CM | POA: Diagnosis present

## 2018-08-27 DIAGNOSIS — Z6841 Body Mass Index (BMI) 40.0 and over, adult: Secondary | ICD-10-CM | POA: Diagnosis not present

## 2018-08-27 DIAGNOSIS — Z87891 Personal history of nicotine dependence: Secondary | ICD-10-CM | POA: Diagnosis not present

## 2018-08-27 DIAGNOSIS — Z8719 Personal history of other diseases of the digestive system: Secondary | ICD-10-CM | POA: Diagnosis not present

## 2018-08-27 DIAGNOSIS — G4730 Sleep apnea, unspecified: Secondary | ICD-10-CM | POA: Diagnosis present

## 2018-08-27 DIAGNOSIS — F319 Bipolar disorder, unspecified: Secondary | ICD-10-CM | POA: Diagnosis not present

## 2018-08-27 DIAGNOSIS — Z880 Allergy status to penicillin: Secondary | ICD-10-CM | POA: Diagnosis not present

## 2018-08-27 DIAGNOSIS — K8010 Calculus of gallbladder with chronic cholecystitis without obstruction: Secondary | ICD-10-CM | POA: Diagnosis not present

## 2018-08-27 DIAGNOSIS — F419 Anxiety disorder, unspecified: Secondary | ICD-10-CM | POA: Insufficient documentation

## 2018-08-27 DIAGNOSIS — Z8249 Family history of ischemic heart disease and other diseases of the circulatory system: Secondary | ICD-10-CM | POA: Insufficient documentation

## 2018-08-27 DIAGNOSIS — K812 Acute cholecystitis with chronic cholecystitis: Secondary | ICD-10-CM | POA: Diagnosis present

## 2018-08-27 DIAGNOSIS — K429 Umbilical hernia without obstruction or gangrene: Secondary | ICD-10-CM | POA: Diagnosis not present

## 2018-08-27 HISTORY — PX: CHOLECYSTECTOMY: SHX55

## 2018-08-27 LAB — GLUCOSE, CAPILLARY: Glucose-Capillary: 145 mg/dL — ABNORMAL HIGH (ref 70–99)

## 2018-08-27 SURGERY — LAPAROSCOPIC CHOLECYSTECTOMY WITH INTRAOPERATIVE CHOLANGIOGRAM
Anesthesia: General

## 2018-08-27 MED ORDER — DEXAMETHASONE SODIUM PHOSPHATE 10 MG/ML IJ SOLN
INTRAMUSCULAR | Status: DC | PRN
  Administered 2018-08-27: 5 mg via INTRAVENOUS

## 2018-08-27 MED ORDER — ACETAMINOPHEN 500 MG PO TABS
1000.0000 mg | ORAL_TABLET | ORAL | Status: AC
  Administered 2018-08-27: 1000 mg via ORAL
  Filled 2018-08-27: qty 2

## 2018-08-27 MED ORDER — BUPIVACAINE-EPINEPHRINE 0.25% -1:200000 IJ SOLN
INTRAMUSCULAR | Status: DC | PRN
  Administered 2018-08-27: 30 mL

## 2018-08-27 MED ORDER — DEXAMETHASONE SODIUM PHOSPHATE 10 MG/ML IJ SOLN
INTRAMUSCULAR | Status: AC
  Filled 2018-08-27: qty 1

## 2018-08-27 MED ORDER — ENOXAPARIN SODIUM 40 MG/0.4ML ~~LOC~~ SOLN
40.0000 mg | SUBCUTANEOUS | Status: DC
  Administered 2018-08-28 – 2018-08-30 (×3): 40 mg via SUBCUTANEOUS
  Filled 2018-08-27 (×3): qty 0.4

## 2018-08-27 MED ORDER — IOPAMIDOL (ISOVUE-300) INJECTION 61%
INTRAVENOUS | Status: DC | PRN
  Administered 2018-08-27: 50 mL via INTRAVENOUS

## 2018-08-27 MED ORDER — ONDANSETRON HCL 4 MG/2ML IJ SOLN
4.0000 mg | Freq: Four times a day (QID) | INTRAMUSCULAR | Status: DC | PRN
  Administered 2018-08-27: 4 mg via INTRAVENOUS
  Filled 2018-08-27: qty 2

## 2018-08-27 MED ORDER — EPHEDRINE SULFATE-NACL 50-0.9 MG/10ML-% IV SOSY
PREFILLED_SYRINGE | INTRAVENOUS | Status: DC | PRN
  Administered 2018-08-27 (×3): 10 mg via INTRAVENOUS

## 2018-08-27 MED ORDER — ACETAMINOPHEN 160 MG/5ML PO SOLN: 325.0000 mg | ORAL | Status: DC | PRN

## 2018-08-27 MED ORDER — ROCURONIUM BROMIDE 10 MG/ML (PF) SYRINGE
PREFILLED_SYRINGE | INTRAVENOUS | Status: DC | PRN
  Administered 2018-08-27: 10 mg via INTRAVENOUS
  Administered 2018-08-27: 40 mg via INTRAVENOUS

## 2018-08-27 MED ORDER — FENTANYL CITRATE (PF) 100 MCG/2ML IJ SOLN
INTRAMUSCULAR | Status: AC
  Filled 2018-08-27: qty 2

## 2018-08-27 MED ORDER — CHLORHEXIDINE GLUCONATE CLOTH 2 % EX PADS: 6.0000 | MEDICATED_PAD | Freq: Once | CUTANEOUS | Status: DC

## 2018-08-27 MED ORDER — GABAPENTIN 300 MG PO CAPS
300.0000 mg | ORAL_CAPSULE | Freq: Two times a day (BID) | ORAL | Status: DC
  Administered 2018-08-27 – 2018-08-30 (×7): 300 mg via ORAL
  Filled 2018-08-27 (×7): qty 1

## 2018-08-27 MED ORDER — FLUOXETINE HCL 40 MG PO CAPS: 40.0000 mg | ORAL_CAPSULE | Freq: Every day | ORAL | Status: DC

## 2018-08-27 MED ORDER — FENTANYL CITRATE (PF) 250 MCG/5ML IJ SOLN
INTRAMUSCULAR | Status: AC
  Filled 2018-08-27: qty 5

## 2018-08-27 MED ORDER — PROPOFOL 10 MG/ML IV BOLUS
INTRAVENOUS | Status: DC | PRN
  Administered 2018-08-27: 200 mg via INTRAVENOUS

## 2018-08-27 MED ORDER — ZOLPIDEM TARTRATE 5 MG PO TABS
5.0000 mg | ORAL_TABLET | Freq: Every evening | ORAL | Status: DC | PRN
  Administered 2018-08-27 – 2018-08-29 (×3): 5 mg via ORAL
  Filled 2018-08-27 (×3): qty 1

## 2018-08-27 MED ORDER — ONDANSETRON HCL 4 MG/2ML IJ SOLN: 4.0000 mg | Freq: Once | INTRAMUSCULAR | Status: DC | PRN

## 2018-08-27 MED ORDER — SUGAMMADEX SODIUM 500 MG/5ML IV SOLN
INTRAVENOUS | Status: AC
  Filled 2018-08-27: qty 5

## 2018-08-27 MED ORDER — HYDRALAZINE HCL 20 MG/ML IJ SOLN
INTRAMUSCULAR | Status: AC
  Filled 2018-08-27: qty 1

## 2018-08-27 MED ORDER — LACTATED RINGERS IV SOLN
INTRAVENOUS | Status: AC | PRN
  Administered 2018-08-27: 1000 mL via INTRAVENOUS

## 2018-08-27 MED ORDER — ONDANSETRON HCL 4 MG/2ML IJ SOLN
INTRAMUSCULAR | Status: AC
  Filled 2018-08-27: qty 2

## 2018-08-27 MED ORDER — VERAPAMIL HCL ER 180 MG PO TBCR
180.0000 mg | EXTENDED_RELEASE_TABLET | Freq: Every day | ORAL | Status: DC
  Administered 2018-08-27 – 2018-08-29 (×3): 180 mg via ORAL
  Filled 2018-08-27 (×3): qty 1

## 2018-08-27 MED ORDER — MEPERIDINE HCL 50 MG/ML IJ SOLN: 6.2500 mg | INTRAMUSCULAR | Status: DC | PRN

## 2018-08-27 MED ORDER — FLUOXETINE HCL 20 MG PO CAPS
40.0000 mg | ORAL_CAPSULE | Freq: Every day | ORAL | Status: DC
  Administered 2018-08-27 – 2018-08-29 (×3): 40 mg via ORAL
  Filled 2018-08-27 (×3): qty 2

## 2018-08-27 MED ORDER — MIDAZOLAM HCL 5 MG/5ML IJ SOLN
INTRAMUSCULAR | Status: DC | PRN
  Administered 2018-08-27: 2 mg via INTRAVENOUS

## 2018-08-27 MED ORDER — FENTANYL CITRATE (PF) 100 MCG/2ML IJ SOLN
25.0000 ug | INTRAMUSCULAR | Status: DC | PRN
  Administered 2018-08-27 (×5): 25 ug via INTRAVENOUS

## 2018-08-27 MED ORDER — FENTANYL CITRATE (PF) 100 MCG/2ML IJ SOLN
INTRAMUSCULAR | Status: DC | PRN
  Administered 2018-08-27 (×3): 50 ug via INTRAVENOUS

## 2018-08-27 MED ORDER — CIPROFLOXACIN IN D5W 400 MG/200ML IV SOLN
400.0000 mg | Freq: Two times a day (BID) | INTRAVENOUS | Status: AC
  Administered 2018-08-27: 400 mg via INTRAVENOUS
  Filled 2018-08-27: qty 200

## 2018-08-27 MED ORDER — HYDRALAZINE HCL 20 MG/ML IJ SOLN
20.0000 mg | Freq: Once | INTRAMUSCULAR | Status: AC
  Administered 2018-08-27: 20 mg via INTRAVENOUS

## 2018-08-27 MED ORDER — VERAPAMIL HCL ER 180 MG PO CP24: 180.0000 mg | ORAL_CAPSULE | Freq: Every day | ORAL | Status: DC

## 2018-08-27 MED ORDER — ONDANSETRON HCL 4 MG/2ML IJ SOLN
INTRAMUSCULAR | Status: DC | PRN
  Administered 2018-08-27: 4 mg via INTRAVENOUS

## 2018-08-27 MED ORDER — SUCCINYLCHOLINE CHLORIDE 20 MG/ML IJ SOLN
INTRAMUSCULAR | Status: DC | PRN
  Administered 2018-08-27: 140 mg via INTRAVENOUS

## 2018-08-27 MED ORDER — PANTOPRAZOLE SODIUM 40 MG PO TBEC
40.0000 mg | DELAYED_RELEASE_TABLET | Freq: Every day | ORAL | Status: DC
  Administered 2018-08-27 – 2018-08-30 (×4): 40 mg via ORAL
  Filled 2018-08-27 (×4): qty 1

## 2018-08-27 MED ORDER — LIDOCAINE 2% (20 MG/ML) 5 ML SYRINGE
INTRAMUSCULAR | Status: DC | PRN
  Administered 2018-08-27: 60 mg via INTRAVENOUS

## 2018-08-27 MED ORDER — BUPIVACAINE HCL (PF) 0.25 % IJ SOLN
INTRAMUSCULAR | Status: AC
  Filled 2018-08-27: qty 30

## 2018-08-27 MED ORDER — LACTATED RINGERS IV SOLN
Freq: Once | INTRAVENOUS | Status: AC
  Administered 2018-08-27: 07:00:00 via INTRAVENOUS

## 2018-08-27 MED ORDER — IOPAMIDOL (ISOVUE-300) INJECTION 61%
INTRAVENOUS | Status: AC
  Filled 2018-08-27: qty 50

## 2018-08-27 MED ORDER — MIDAZOLAM HCL 2 MG/2ML IJ SOLN
INTRAMUSCULAR | Status: AC
  Filled 2018-08-27: qty 2

## 2018-08-27 MED ORDER — ACETAMINOPHEN 325 MG PO TABS: 325.0000 mg | ORAL_TABLET | ORAL | Status: DC | PRN

## 2018-08-27 MED ORDER — GABAPENTIN 300 MG PO CAPS
300.0000 mg | ORAL_CAPSULE | ORAL | Status: AC
  Administered 2018-08-27: 300 mg via ORAL
  Filled 2018-08-27: qty 1

## 2018-08-27 MED ORDER — PROPOFOL 10 MG/ML IV BOLUS
INTRAVENOUS | Status: AC
  Filled 2018-08-27: qty 20

## 2018-08-27 MED ORDER — HEPARIN SODIUM (PORCINE) 5000 UNIT/ML IJ SOLN
5000.0000 [IU] | Freq: Once | INTRAMUSCULAR | Status: AC
  Administered 2018-08-27: 5000 [IU] via SUBCUTANEOUS
  Filled 2018-08-27: qty 1

## 2018-08-27 MED ORDER — MORPHINE SULFATE (PF) 4 MG/ML IV SOLN
2.0000 mg | INTRAVENOUS | Status: DC | PRN
  Administered 2018-08-27 (×3): 2 mg via INTRAVENOUS
  Filled 2018-08-27 (×3): qty 1

## 2018-08-27 MED ORDER — CELECOXIB 200 MG PO CAPS
200.0000 mg | ORAL_CAPSULE | ORAL | Status: AC
  Administered 2018-08-27: 200 mg via ORAL
  Filled 2018-08-27: qty 1

## 2018-08-27 MED ORDER — POTASSIUM CHLORIDE IN NACL 20-0.9 MEQ/L-% IV SOLN
INTRAVENOUS | Status: DC
  Administered 2018-08-27 – 2018-08-30 (×5): via INTRAVENOUS
  Filled 2018-08-27 (×5): qty 1000

## 2018-08-27 MED ORDER — ACETAMINOPHEN 500 MG PO TABS
1000.0000 mg | ORAL_TABLET | Freq: Four times a day (QID) | ORAL | Status: DC
  Administered 2018-08-27 – 2018-08-30 (×11): 1000 mg via ORAL
  Filled 2018-08-27 (×13): qty 2

## 2018-08-27 MED ORDER — CIPROFLOXACIN IN D5W 400 MG/200ML IV SOLN
400.0000 mg | INTRAVENOUS | Status: AC
  Administered 2018-08-27: 400 mg via INTRAVENOUS
  Filled 2018-08-27: qty 200

## 2018-08-27 MED ORDER — ONDANSETRON 4 MG PO TBDP: 4.0000 mg | ORAL_TABLET | Freq: Four times a day (QID) | ORAL | Status: DC | PRN

## 2018-08-27 MED ORDER — OXYCODONE HCL 5 MG PO TABS: 5.0000 mg | ORAL_TABLET | Freq: Once | ORAL | Status: DC | PRN

## 2018-08-27 MED ORDER — SUGAMMADEX SODIUM 200 MG/2ML IV SOLN
INTRAVENOUS | Status: DC | PRN
  Administered 2018-08-27: 250 mg via INTRAVENOUS

## 2018-08-27 MED ORDER — OXYCODONE HCL 5 MG/5ML PO SOLN
5.0000 mg | Freq: Once | ORAL | Status: DC | PRN
  Filled 2018-08-27: qty 5

## 2018-08-27 MED ORDER — OXYCODONE HCL 5 MG PO TABS
5.0000 mg | ORAL_TABLET | ORAL | Status: DC | PRN
  Administered 2018-08-27 – 2018-08-28 (×2): 5 mg via ORAL
  Administered 2018-08-28 (×3): 10 mg via ORAL
  Administered 2018-08-29 – 2018-08-30 (×4): 5 mg via ORAL
  Filled 2018-08-27: qty 2
  Filled 2018-08-27 (×3): qty 1
  Filled 2018-08-27: qty 2
  Filled 2018-08-27 (×3): qty 1
  Filled 2018-08-27: qty 2

## 2018-08-27 MED ORDER — BUPIVACAINE-EPINEPHRINE (PF) 0.5% -1:200000 IJ SOLN
INTRAMUSCULAR | Status: AC
  Filled 2018-08-27: qty 30

## 2018-08-27 MED ORDER — LACTATED RINGERS IV SOLN
INTRAVENOUS | Status: DC | PRN
  Administered 2018-08-27 (×3): via INTRAVENOUS

## 2018-08-27 SURGICAL SUPPLY — 39 items
APPLIER CLIP ROT 10 11.4 M/L (STAPLE) ×2
CABLE HIGH FREQUENCY MONO STRZ (ELECTRODE) ×2 IMPLANT
CATH REDDICK CHOLANGI 4FR 50CM (CATHETERS) IMPLANT
CHLORAPREP W/TINT 26ML (MISCELLANEOUS) ×2 IMPLANT
CLIP APPLIE ROT 10 11.4 M/L (STAPLE) ×1 IMPLANT
COVER MAYO STAND STRL (DRAPES) ×2 IMPLANT
COVER SURGICAL LIGHT HANDLE (MISCELLANEOUS) ×2 IMPLANT
DECANTER SPIKE VIAL GLASS SM (MISCELLANEOUS) ×2 IMPLANT
DERMABOND ADVANCED (GAUZE/BANDAGES/DRESSINGS) ×1
DERMABOND ADVANCED .7 DNX12 (GAUZE/BANDAGES/DRESSINGS) ×1 IMPLANT
DRAIN CHANNEL 19F RND (DRAIN) ×2 IMPLANT
DRAPE C-ARM 42X120 X-RAY (DRAPES) ×2 IMPLANT
DRSG TEGADERM 4X4.75 (GAUZE/BANDAGES/DRESSINGS) ×2 IMPLANT
ELECT REM PT RETURN 15FT ADLT (MISCELLANEOUS) ×2 IMPLANT
EVACUATOR SILICONE 100CC (DRAIN) ×2 IMPLANT
FILTER SMOKE EVAC LAPAROSHD (FILTER) ×2 IMPLANT
GLOVE BIOGEL PI IND STRL 7.5 (GLOVE) ×1 IMPLANT
GLOVE BIOGEL PI INDICATOR 7.5 (GLOVE) ×1
GLOVE ECLIPSE 7.5 STRL STRAW (GLOVE) ×2 IMPLANT
GOWN STRL REUS W/TWL XL LVL3 (GOWN DISPOSABLE) ×6 IMPLANT
HEMOSTAT SNOW SURGICEL 2X4 (HEMOSTASIS) IMPLANT
HEMOSTAT SURGICEL 4X8 (HEMOSTASIS) ×2 IMPLANT
KIT BASIN OR (CUSTOM PROCEDURE TRAY) ×2 IMPLANT
POUCH RETRIEVAL ECOSAC 10 (ENDOMECHANICALS) IMPLANT
POUCH RETRIEVAL ECOSAC 10MM (ENDOMECHANICALS)
SCISSORS LAP 5X35 DISP (ENDOMECHANICALS) ×2 IMPLANT
SET CHOLANGIOGRAPH MIX (MISCELLANEOUS) ×2 IMPLANT
SET IRRIG TUBING LAPAROSCOPIC (IRRIGATION / IRRIGATOR) ×2 IMPLANT
SLEEVE XCEL OPT CAN 5 100 (ENDOMECHANICALS) ×2 IMPLANT
SPONGE DRAIN TRACH 4X4 STRL 2S (GAUZE/BANDAGES/DRESSINGS) ×2 IMPLANT
SUT ETHILON 2 0 PS N (SUTURE) ×2 IMPLANT
SUT MNCRL AB 4-0 PS2 18 (SUTURE) ×2 IMPLANT
SUT VICRYL 0 UR6 27IN ABS (SUTURE) ×2 IMPLANT
TOWEL OR 17X26 10 PK STRL BLUE (TOWEL DISPOSABLE) ×2 IMPLANT
TRAY LAPAROSCOPIC (CUSTOM PROCEDURE TRAY) ×2 IMPLANT
TROCAR BLADELESS OPT 5 100 (ENDOMECHANICALS) ×2 IMPLANT
TROCAR XCEL BLUNT TIP 100MML (ENDOMECHANICALS) ×2 IMPLANT
TROCAR XCEL NON-BLD 11X100MML (ENDOMECHANICALS) ×2 IMPLANT
TUBING INSUF HEATED (TUBING) ×2 IMPLANT

## 2018-08-27 NOTE — Anesthesia Postprocedure Evaluation (Signed)
Anesthesia Post Note  Patient: Allianna Beaubien Monette  Procedure(s) Performed: LAPAROSCOPIC CHOLECYSTECTOMY WITH INTRAOPERATIVE CHOLANGIOGRAM (N/A )     Patient location during evaluation: PACU Anesthesia Type: General Level of consciousness: awake and alert Pain management: pain level controlled Vital Signs Assessment: post-procedure vital signs reviewed and stable Respiratory status: spontaneous breathing, nonlabored ventilation, respiratory function stable and patient connected to nasal cannula oxygen Cardiovascular status: blood pressure returned to baseline and stable Postop Assessment: no apparent nausea or vomiting Anesthetic complications: no    Last Vitals:  Vitals:   08/27/18 1110 08/27/18 1115  BP:  (!) 145/82  Pulse: 81 84  Resp: 13 14  Temp:    SpO2: 100% 100%    Last Pain:  Vitals:   08/27/18 0654  TempSrc:   PainSc: 0-No pain                 Jamarii Banks

## 2018-08-27 NOTE — Op Note (Signed)
Preoperative Diagnosis: CHOLELITHIASIS, CHOLECYSTITIS  Postoprative Diagnosis: CHOLELITHIASIS, CHOLECYSTITIS  Procedure: Procedure(s): LAPAROSCOPIC CHOLECYSTECTOMY WITH INTRAOPERATIVE CHOLANGIOGRAM   Surgeon: Excell Seltzer T   Assistants: None  Anesthesia:  General endotracheal anesthesia  Indications: Patient is a 52 year old female who presented with recent episode of severe right upper quadrant abdominal pain which have been gradually subsiding.  Gallbladder ultrasound showed stones and thickening of the gallbladder wall.  LFTs normal.  She was seen in the office and scheduled for elective surgery as she was minimally symptomatic.  The indications and nature of surgery and risks have been discussed in detail with the patient documented elsewhere.    Procedure Detail: Patient was brought to the operating room, placed in supine position on the operating table, and general endotracheal anesthesia induced.  She received preoperative IV antibiotics.  The abdomen was widely sterilely prepped and draped.  PAS were in place.  Patient timeout was performed and correct procedure verified.  A curvilinear infraumbilical incision was used.  She had a small umbilical hernia.  The umbilical skin was dissected up off of the hernia sac which was excised and the peritoneum opened.  Fascial edges were defined.  This was about a 2 cm defect.  0 Vicryl sutures were placed on either side 4 stay sutures and the Hassan trocar placed and pneumoperitoneum established.  Under direct vision an 11 mm trocar was placed subxiphoid and two 5 mm trochars along the right subcostal margin.  The gallbladder was noted to be markedly distended and firm with dense omental adhesions.  Using cautery and blunt dissection I began to take these dense adhesions off of the fundus of the gallbladder.  There was significant subacute inflammation.  The fundus was exposed and the needle aspirator used to aspirate purulent appearing bile  with decompression of the gallbladder.  The fundus was then able to be grasped and elevated.  Extensive fibrofatty fairly dense adhesions were carefully taken down with cautery and blunt dissection with the gallbladder being markedly enlarged and distended.  This tedious dissection ended down toward the infundibulum.  Some filmy adhesions of the duodenum and colon were taken down without injury.  The fundus was then able to be grasped and using mostly careful blunt dissection the distal gallbladder was dissected.  There was less intense inflammation around the hepatoduodenal ligament.  The distal gallbladder was thoroughly dissected as much as possible with the degree of inflammation.  I felt like clearly identified the distal gallbladder tapering down to the cystic duct.  The distal gallbladder was dissected as much as possible although again could not really mobilize off the cholecystic plate.  The cystic duct was encircled and dissected out for about 2 cm.  The cystic duct was clipped of the gallbladder junction and an operative cholangiogram obtained through the cystic duct which showed good filling of a normal common bile duct and intrahepatic ducts with free flow into the duodenum and a fairly long cystic duct remnant.  Following this the Cholangiocath was removed and the cystic duct was doubly clipped proximally and divided.  The infundibulum was then mobilized try to stay right on the gallbladder wall.  The cystic artery was encountered and was controlled with 2 proximal and one distal clip.  The gallbladder was then very tediously dissected away from the liver bed.  There was really no plane between the densely inflamed gallbladder and the liver and I did get into the gallbladder and stones were removed.  There was quite a bit of hepatic parenchyma  exposed during the dissection which was cauterized as again there was no plane.  With tedious dissection events of the gallbladder was separated and placed in  Endo Catch bag and brought up through the umbilical incision.  Right upper quadrant was thoroughly irrigated and hemostasis obtained with cautery and Surgicel.  A closed suction drain was left through 1 of the lateral ports and placed in Morison's pouch.  The abdomen was carefully inspected and everything looked fine.  CO2 is evacuated and trochars removed.  The umbilical fascia was closed with several additional interrupted 0 Vicryl sutures.  Skin incisions closed with subcuticular Monocryl and Dermabond.  Sponge needle and instrument counts were correct.    Findings: Severe subacute cholecystitis and cholelithiasis  Estimated Blood Loss:  less than 100 mL         Drains: 19 Blake drain in right upper quadrant  Blood Given: none          Specimens: Gallbladder and contents        Complications:  * No complications entered in OR log *         Disposition: PACU - hemodynamically stable.         Condition: stable

## 2018-08-27 NOTE — Transfer of Care (Signed)
Immediate Anesthesia Transfer of Care Note  Patient: Alexis Wall  Procedure(s) Performed: LAPAROSCOPIC CHOLECYSTECTOMY WITH INTRAOPERATIVE CHOLANGIOGRAM (N/A )  Patient Location: PACU  Anesthesia Type:General  Level of Consciousness: sedated, patient cooperative and responds to stimulation  Airway & Oxygen Therapy: Patient Spontanous Breathing and Patient connected to face mask oxygen  Post-op Assessment: Report given to RN and Post -op Vital signs reviewed and stable  Post vital signs: Reviewed and stable  Last Vitals:  Vitals Value Taken Time  BP 144/103 08/27/2018 10:48 AM  Temp    Pulse 69 08/27/2018 10:50 AM  Resp 15 08/27/2018 10:50 AM  SpO2 95 % 08/27/2018 10:50 AM  Vitals shown include unvalidated device data.  Last Pain:  Vitals:   08/27/18 0654  TempSrc:   PainSc: 0-No pain         Complications: No apparent anesthesia complications

## 2018-08-27 NOTE — H&P (Signed)
History of Present Illness Marland Kitchen T. Huntleigh Doolen MD; 08/15/2018 9:43 AM) The patient is a 52 year old female who presents for evaluation of gall stones. Patient is a pleasant 52 year old female referred by Dr. Sharlett Iles for symptomatic cholelithiasis. The patient states that she had an initial episode of fairly severe right upper quadrant and right flank pain associated with nausea and vomiting in February of this year. This resolved after about a day. She had another episode in June that lasted about 3 days and gradually resolved. She later saw Dr. Sharlett Iles and gallbladder ultrasound was obtained on June 13, 2018. This shows multiple gallstones with sludge, wall thickening and adenomyosis. Later while on vacation in [place] she had an episode of severe nausea and vomiting lasting about 24 hours associated with some mild pain. She has a constant sensation of some mild nagging discomfort in her right flank and toward her right back. No fever or chills or jaundice. She's had a polypectomy 2017. Other significant history includes hypertension and sleep apnea. She has a CPAP but uses it sporadically.   Past Surgical History Alean Rinne, [place]; 08/15/2018 9:16 AM) Cesarean Section - Multiple  Colon Polyp Removal - Colonoscopy  Hysterectomy (not due to cancer) - Partial  Oral Surgery   Diagnostic Studies History Alean Rinne, [place]; 08/15/2018 9:16 AM) Colonoscopy  1-5 years ago Mammogram  1-3 years ago Pap Smear  1-5 years ago  Allergies Alean Rinne, RMA; 08/15/2018 9:19 AM) Allergies Reconciled   Medication History Alean Rinne, RMA; 08/15/2018 9:19 AM) Zolpidem Tartrate (10MG  Tablet, Oral) Active. FLUoxetine HCl (40MG  Capsule, Oral) Active. Omeprazole (20MG  Capsule DR, Oral) Active. Verapamil HCl ER (180MG  Capsule ER 24HR, Oral) Active.  Social History Alean Rinne, [place]; 08/15/2018 9:16 AM) Alcohol use  Occasional alcohol use. Caffeine use  Carbonated beverages,  Coffee. No drug use  Tobacco use  Never smoker.  Family History Alean Rinne, [place]; 08/15/2018 9:16 AM) Arthritis  Father, Mother. Colon Polyps  Father. Depression  Father. Heart disease in female family member before age 51  Hypertension  Father.  Pregnancy / Birth History Alean Rinne, [place]; 08/15/2018 9:16 AM) Age at menarche  32 years. Age of menopause  51-54 Contraceptive History  Oral contraceptives. Gravida  3 Irregular periods  Length (months) of breastfeeding  3-6 Maternal age  75-35 Para  2  Other Problems Alean Rinne, [place]; 08/15/2018 9:16 AM) Anxiety Disorder  Arthritis  Bladder Problems  Cholelithiasis  Depression  Gastroesophageal Reflux Disease  Hemorrhoids  High blood pressure  Sleep Apnea     Review of Systems Alean Rinne RMA; 08/15/2018 9:16 AM) Respiratory Present- Snoring. Not Present- Bloody sputum, Chronic Cough, Difficulty Breathing and Wheezing. Gastrointestinal Present- Indigestion and Nausea. Not Present- Abdominal Pain, Bloating, Bloody Stool, Change in Bowel Habits, Chronic diarrhea, Constipation, Difficulty Swallowing, Excessive gas, Gets full quickly at meals, Hemorrhoids, Rectal Pain and Vomiting. Musculoskeletal Present- Joint Pain. Not Present- Back Pain, Joint Stiffness, Muscle Pain, Muscle Weakness and Swelling of Extremities.  Vitals Mardene Celeste King RMA; 08/15/2018 9:18 AM) 08/15/2018 9:17 AM Weight: 249.6 lb Height: 63.5in Body Surface Area: 2.14 m Body Mass Index: 43.52 kg/m  Temp.: 98.21F  Pulse: 95 (Regular)  BP: 140/82 (Sitting, Left Arm, Standard)       Physical Exam Marland Kitchen T. Kathaleen Dudziak MD; 08/15/2018 9:32 AM) The physical exam findings are as follows: Note:General: Alert, obese Caucasian female, in no distress Skin: Warm and dry without rash or infection. HEENT: No palpable masses or thyromegaly. Sclera nonicteric. Pupils equal round and reactive.  Lymph nodes: No cervical,  supraclavicular, or inguinal nodes palpable. Lungs: Breath sounds clear and equal. No wheezing or increased work of breathing. Cardiovascular: Regular rate and rhythm without murmer. No JVD or edema. Peripheral pulses intact. No carotid bruits. Abdomen: Nondistended. Small umbilical hernia. Soft and nontender. No masses palpable. No organomegaly. No palpable hernias. Extremities: No edema or joint swelling or deformity. No chronic venous stasis changes. Neurologic: Alert and fully oriented. Gait normal. No focal weakness. Psychiatric: Normal mood and affect. Thought content appropriate with normal judgement and insight    Assessment & Plan Marland Kitchen T. Carley Glendenning MD; 08/15/2018 9:45 AM) CALCULUS OF BILE DUCT WITH CHRONIC CHOLECYSTITIS WITHOUT OBSTRUCTION (K80.44) Impression: 52 year old female with several episodes of typical severe biliary colic. Gallbladder ultrasound has confirmed multiple stones as well as gallbladder thickening and adenomyosis. She continues to have ongoing mild discomfort. I recommended proceeding with laparoscopic cholecystectomy with cholangiogram to prevent further attacks and complications from her gallstones. I discussed the procedure in detail. The patient was given Neurosurgeon. We discussed the risks and benefits of a laparoscopic cholecystectomy and possible cholangiogram including, but not limited to, bleeding, infection, injury to surrounding structures such as the intestine or liver, bile leak, retained gallstones, need to convert to an open procedure, prolonged diarrhea, blood clots such as DVT, common bile duct injury, anesthesia risks, and possible need for additional procedures. The likelihood of improvement in symptoms and return to the patient's normal status is good. We discussed the typical post-operative recovery course. All questions were answered.

## 2018-08-27 NOTE — Anesthesia Procedure Notes (Signed)
Procedure Name: Intubation Performed by: Gean Maidens, CRNA Pre-anesthesia Checklist: Patient identified, Emergency Drugs available and Timeout performed Patient Re-evaluated:Patient Re-evaluated prior to induction Oxygen Delivery Method: Circle system utilized Preoxygenation: Pre-oxygenation with 100% oxygen Induction Type: IV induction Ventilation: Mask ventilation without difficulty Laryngoscope Size: Mac and 4 Grade View: Grade I Tube type: Oral Tube size: 7.0 mm Number of attempts: 1 Airway Equipment and Method: Stylet Placement Confirmation: ETT inserted through vocal cords under direct vision,  positive ETCO2 and breath sounds checked- equal and bilateral Secured at: 21 cm Tube secured with: Tape Dental Injury: Teeth and Oropharynx as per pre-operative assessment

## 2018-08-27 NOTE — Interval H&P Note (Signed)
History and Physical Interval Note:  08/27/2018 8:24 AM  Alexis Wall  has presented today for surgery, with the diagnosis of CHOLELITHIASIS, CHOLECYSTITIS  The various methods of treatment have been discussed with the patient and family. After consideration of risks, benefits and other options for treatment, the patient has consented to  Procedure(s): LAPAROSCOPIC CHOLECYSTECTOMY WITH INTRAOPERATIVE CHOLANGIOGRAM (N/A) as a surgical intervention .  The patient's history has been reviewed, patient examined, no change in status, stable for surgery.  I have reviewed the patient's chart and labs.  Questions were answered to the patient's satisfaction.     Darene Lamer Yuniel Blaney

## 2018-08-28 ENCOUNTER — Encounter (HOSPITAL_COMMUNITY): Payer: Self-pay | Admitting: General Surgery

## 2018-08-28 DIAGNOSIS — K219 Gastro-esophageal reflux disease without esophagitis: Secondary | ICD-10-CM | POA: Diagnosis present

## 2018-08-28 DIAGNOSIS — M1990 Unspecified osteoarthritis, unspecified site: Secondary | ICD-10-CM | POA: Diagnosis not present

## 2018-08-28 DIAGNOSIS — I10 Essential (primary) hypertension: Secondary | ICD-10-CM | POA: Diagnosis not present

## 2018-08-28 DIAGNOSIS — Z87891 Personal history of nicotine dependence: Secondary | ICD-10-CM | POA: Diagnosis not present

## 2018-08-28 DIAGNOSIS — F419 Anxiety disorder, unspecified: Secondary | ICD-10-CM | POA: Diagnosis not present

## 2018-08-28 DIAGNOSIS — G4730 Sleep apnea, unspecified: Secondary | ICD-10-CM | POA: Diagnosis not present

## 2018-08-28 DIAGNOSIS — Z8249 Family history of ischemic heart disease and other diseases of the circulatory system: Secondary | ICD-10-CM | POA: Diagnosis not present

## 2018-08-28 DIAGNOSIS — D649 Anemia, unspecified: Secondary | ICD-10-CM | POA: Diagnosis present

## 2018-08-28 DIAGNOSIS — Z79899 Other long term (current) drug therapy: Secondary | ICD-10-CM | POA: Diagnosis not present

## 2018-08-28 DIAGNOSIS — F319 Bipolar disorder, unspecified: Secondary | ICD-10-CM | POA: Diagnosis present

## 2018-08-28 DIAGNOSIS — N800 Endometriosis of uterus: Secondary | ICD-10-CM | POA: Diagnosis not present

## 2018-08-28 DIAGNOSIS — K8000 Calculus of gallbladder with acute cholecystitis without obstruction: Secondary | ICD-10-CM | POA: Diagnosis not present

## 2018-08-28 DIAGNOSIS — Z6841 Body Mass Index (BMI) 40.0 and over, adult: Secondary | ICD-10-CM | POA: Diagnosis not present

## 2018-08-28 DIAGNOSIS — Z880 Allergy status to penicillin: Secondary | ICD-10-CM | POA: Diagnosis not present

## 2018-08-28 DIAGNOSIS — K429 Umbilical hernia without obstruction or gangrene: Secondary | ICD-10-CM | POA: Diagnosis not present

## 2018-08-28 LAB — CBC
HCT: 45 % (ref 36.0–46.0)
Hemoglobin: 14.5 g/dL (ref 12.0–15.0)
MCH: 27.7 pg (ref 26.0–34.0)
MCHC: 32.2 g/dL (ref 30.0–36.0)
MCV: 86 fL (ref 78.0–100.0)
Platelets: 286 10*3/uL (ref 150–400)
RBC: 5.23 MIL/uL — ABNORMAL HIGH (ref 3.87–5.11)
RDW: 13.9 % (ref 11.5–15.5)
WBC: 15.7 10*3/uL — ABNORMAL HIGH (ref 4.0–10.5)

## 2018-08-28 NOTE — Progress Notes (Signed)
Patient ID: Alexis Wall, female   DOB: 05-19-1966, 52 y.o.   MRN: [id number] 1 Day Post-Op   Subjective: "Very sore" but not severe pain.  Was a little lightheaded when up to walk last night.  Nauseated yesterday which seems better today.  Objective: Vital signs in last 24 hours: Temp:  [97.7 F (36.5 C)-98.7 F (37.1 C)] 98.5 F (36.9 C) (10/03 0417) Pulse Rate:  [67-89] 67 (10/03 0417) Resp:  [12-20] 20 (10/03 0417) BP: (114-175)/(57-112) 114/72 (10/03 0417) SpO2:  [93 %-100 %] 94 % (10/03 0417) Last BM Date: 08/26/18  Intake/Output from previous day: 10/02 0701 - 10/03 0700 In: 2683.2 [P.O.:240; I.V.:2443.2] Out: 9774 [Urine:3650; Drains:50; Blood:50] Intake/Output this shift: No intake/output data recorded.  General appearance: alert, cooperative and no distress GI: Mild epigastric and right upper quadrant tenderness Incision/Wound: No erythema or drainage.  JP drainage is old bloody  Lab Results:  Recent Labs    08/25/18 1018 08/28/18 0413  WBC 8.9 15.7*  HGB 13.7 14.5  HCT 42.4 45.0  PLT 367 286   BMET Recent Labs    08/25/18 1018  NA 141  K 4.3  CL 105  CO2 26  GLUCOSE 87  BUN 9  CREATININE 0.68  CALCIUM 9.4     Studies/Results: Dg Cholangiogram Operative  Result Date: 08/27/2018 CLINICAL DATA:  52 year old female with a history of cholelithiasis EXAM: INTRAOPERATIVE CHOLANGIOGRAM TECHNIQUE: Cholangiographic images from the C-arm fluoroscopic device were submitted for interpretation post-operatively. Please see the procedural report for the amount of contrast and the fluoroscopy time utilized. COMPARISON:  None. FINDINGS: Surgical instruments project over the upper abdomen. There is cannulation of the cystic duct/gallbladder neck, with antegrade infusion of contrast. Caliber of the extrahepatic ductal system within normal limits. No definite filling defect within the extrahepatic ducts identified. Free flow of contrast across the ampulla. IMPRESSION:  Intraoperative cholangiogram demonstrates extrahepatic biliary ducts of unremarkable caliber, with no definite filling defects identified. Free flow of contrast across the ampulla. Please refer to the dictated operative report for full details of intraoperative findings and procedure Electronically Signed   By: Corrie Mckusick D.O.   On: 08/27/2018 10:34    Anti-infectives: Anti-infectives (From admission, onward)   Start     Dose/Rate Route Frequency Ordered Stop   08/27/18 2000  ciprofloxacin (CIPRO) IVPB 400 mg     400 mg 200 mL/hr over 60 Minutes Intravenous Every 12 hours 08/27/18 1242 08/27/18 2123   08/27/18 0645  ciprofloxacin (CIPRO) IVPB 400 mg     400 mg 200 mL/hr over 60 Minutes Intravenous On call to O.R. 08/27/18 1423 08/27/18 0908      Assessment/Plan: s/p Procedure(s): LAPAROSCOPIC CHOLECYSTECTOMY WITH INTRAOPERATIVE CHOLANGIOGRAM Severe subacute cholecystitis Stable postoperatively.  Not quite ready for discharge.  Possibly later today or tomorrow morning.  Ambulate and advance diet as tolerated today.   LOS: 0 days    Alexis Wall 08/28/2018

## 2018-08-29 DIAGNOSIS — I10 Essential (primary) hypertension: Secondary | ICD-10-CM | POA: Diagnosis not present

## 2018-08-29 DIAGNOSIS — F319 Bipolar disorder, unspecified: Secondary | ICD-10-CM | POA: Diagnosis not present

## 2018-08-29 DIAGNOSIS — Z880 Allergy status to penicillin: Secondary | ICD-10-CM | POA: Diagnosis not present

## 2018-08-29 DIAGNOSIS — N800 Endometriosis of uterus: Secondary | ICD-10-CM | POA: Diagnosis not present

## 2018-08-29 DIAGNOSIS — K8000 Calculus of gallbladder with acute cholecystitis without obstruction: Secondary | ICD-10-CM | POA: Diagnosis not present

## 2018-08-29 DIAGNOSIS — G4730 Sleep apnea, unspecified: Secondary | ICD-10-CM | POA: Diagnosis not present

## 2018-08-29 DIAGNOSIS — F419 Anxiety disorder, unspecified: Secondary | ICD-10-CM | POA: Diagnosis not present

## 2018-08-29 DIAGNOSIS — Z87891 Personal history of nicotine dependence: Secondary | ICD-10-CM | POA: Diagnosis not present

## 2018-08-29 DIAGNOSIS — K219 Gastro-esophageal reflux disease without esophagitis: Secondary | ICD-10-CM | POA: Diagnosis not present

## 2018-08-29 DIAGNOSIS — Z6841 Body Mass Index (BMI) 40.0 and over, adult: Secondary | ICD-10-CM | POA: Diagnosis not present

## 2018-08-29 DIAGNOSIS — D649 Anemia, unspecified: Secondary | ICD-10-CM | POA: Diagnosis not present

## 2018-08-29 DIAGNOSIS — M1990 Unspecified osteoarthritis, unspecified site: Secondary | ICD-10-CM | POA: Diagnosis not present

## 2018-08-29 DIAGNOSIS — Z8249 Family history of ischemic heart disease and other diseases of the circulatory system: Secondary | ICD-10-CM | POA: Diagnosis not present

## 2018-08-29 DIAGNOSIS — Z79899 Other long term (current) drug therapy: Secondary | ICD-10-CM | POA: Diagnosis not present

## 2018-08-29 DIAGNOSIS — K429 Umbilical hernia without obstruction or gangrene: Secondary | ICD-10-CM | POA: Diagnosis not present

## 2018-08-29 LAB — CBC
HCT: 36.9 % (ref 36.0–46.0)
Hemoglobin: 11.4 g/dL — ABNORMAL LOW (ref 12.0–15.0)
MCH: 27.7 pg (ref 26.0–34.0)
MCHC: 30.9 g/dL (ref 30.0–36.0)
MCV: 89.8 fL (ref 78.0–100.0)
Platelets: 314 10*3/uL (ref 150–400)
RBC: 4.11 MIL/uL (ref 3.87–5.11)
RDW: 14.2 % (ref 11.5–15.5)
WBC: 13.7 10*3/uL — ABNORMAL HIGH (ref 4.0–10.5)

## 2018-08-29 NOTE — Progress Notes (Signed)
2 Days Post-Op      Subjective: Having a lot of pain, very tender RUQ, taking oxycodone and Tylenol. CPAP in room.  Drainage is brown in color.    Objective: Vital signs in last 24 hours: Temp:  [98.2 F (36.8 C)-99.5 F (37.5 C)] 98.2 F (36.8 C) (10/04 0608) Pulse Rate:  [64-74] 64 (10/04 0608) Resp:  [16-18] 16 (10/04 0608) BP: (132-158)/(87-97) 132/93 (10/04 0608) SpO2:  [94 %-98 %] 94 % (10/04 0608) Last BM Date: 08/26/18 120 PO recorded 1700 IV 2450 urine Drain 54 Afebrile, BP up some WBC still up IOC:  Intraoperative cholangiogram demonstrates extrahepatic biliary ducts of unremarkable caliber, with no definite filling defects identified. Free flow of contrast across the ampulla.  Intake/Output from previous day: 10/03 0701 - 10/04 0700 In: 1798.3 [P.O.:120; I.V.:1678.3] Out: 2504 [Urine:2450; Drains:54] Intake/Output this shift: Total I/O In: -  Out: 1210 [Urine:1200; Drains:10]  General appearance: alert, cooperative and still having a lot of pain RUQ. Resp: clear to auscultation bilaterally GI: soft very tender over RUQ, brown fluid in the drain.  Port sites [employer]  Lab Results:  Recent Labs    08/28/18 0413 08/29/18 0448  WBC 15.7* 13.7*  HGB 14.5 11.4*  HCT 45.0 36.9  PLT 286 314    BMET No results for input(s): NA, K, CL, CO2, GLUCOSE, BUN, CREATININE, CALCIUM in the last 72 hours. PT/INR No results for input(s): LABPROT, INR in the last 72 hours.  Recent Labs  Lab 08/25/18 1018  AST 21  ALT 20  ALKPHOS 81  BILITOT 0.4  PROT 7.4  ALBUMIN 3.7     Lipase     Component Value Date/Time   LIPASE 45 08/25/2018 1018     Prior to Admission medications   Medication Sig Start Date End Date Taking? Authorizing Provider  FLUoxetine (PROZAC) 40 MG capsule Take 40 mg by mouth at bedtime.   Yes [provider]  ibuprofen (ADVIL,MOTRIN) 200 MG tablet Take 400 mg by mouth every 6 (six) hours as needed for headache or mild pain.    Yes  [provider]  omeprazole (PRILOSEC) 20 MG capsule Take 20 mg by mouth at bedtime.    Yes [provider]  verapamil (VERELAN PM) 180 MG 24 hr capsule Take 180 mg by mouth at bedtime. 06/20/18  Yes [provider]  zolpidem (AMBIEN) 10 MG tablet Take 10 mg by mouth at bedtime.    Yes [provider]   Medications: . acetaminophen  1,000 mg Oral Q6H  . enoxaparin (LOVENOX) injection  40 mg Subcutaneous Q24H  . FLUoxetine  40 mg Oral QHS  . gabapentin  300 mg Oral BID  . pantoprazole  40 mg Oral Daily  . verapamil  180 mg Oral QHS   . 0.9 % NaCl with KCl 20 mEq / L 75 mL/hr at 08/28/18 2323   Anti-infectives (From admission, onward)   Start     Dose/Rate Route Frequency Ordered Stop   08/27/18 2000  ciprofloxacin (CIPRO) IVPB 400 mg     400 mg 200 mL/hr over 60 Minutes Intravenous Every 12 hours 08/27/18 1242 08/27/18 2123   08/27/18 0645  ciprofloxacin (CIPRO) IVPB 400 mg     400 mg 200 mL/hr over 60 Minutes Intravenous On call to O.R. 08/27/18 0643 08/27/18 0908     Assessment/Plan GERD Hypertension    Severe subacute cholecystitis/cholelithiasis Laparoscopic cholecystectomy with IOC, 08/27/2018, Dr. Excell Seltzer   FEN:  IV fluids/regular Diet ID:  Pre op; Cipro  DVT: Lovenox Follow up:  Dr. Excell Seltzer    Plan: She is having a lot of pain.  Brown-colored fluid from the JP drain.  No BM so far.  Port sites look good we will review with Dr. Excell Seltzer.     LOS: 0 days    Kamarii Carton 08/29/2018 [phone]

## 2018-08-29 NOTE — Plan of Care (Signed)

## 2018-08-30 DIAGNOSIS — Z87891 Personal history of nicotine dependence: Secondary | ICD-10-CM | POA: Diagnosis not present

## 2018-08-30 DIAGNOSIS — M1990 Unspecified osteoarthritis, unspecified site: Secondary | ICD-10-CM | POA: Diagnosis not present

## 2018-08-30 DIAGNOSIS — Z79899 Other long term (current) drug therapy: Secondary | ICD-10-CM | POA: Diagnosis not present

## 2018-08-30 DIAGNOSIS — K219 Gastro-esophageal reflux disease without esophagitis: Secondary | ICD-10-CM | POA: Diagnosis not present

## 2018-08-30 DIAGNOSIS — F319 Bipolar disorder, unspecified: Secondary | ICD-10-CM | POA: Diagnosis not present

## 2018-08-30 DIAGNOSIS — F419 Anxiety disorder, unspecified: Secondary | ICD-10-CM | POA: Diagnosis not present

## 2018-08-30 DIAGNOSIS — Z8249 Family history of ischemic heart disease and other diseases of the circulatory system: Secondary | ICD-10-CM | POA: Diagnosis not present

## 2018-08-30 DIAGNOSIS — K8000 Calculus of gallbladder with acute cholecystitis without obstruction: Secondary | ICD-10-CM | POA: Diagnosis not present

## 2018-08-30 DIAGNOSIS — Z6841 Body Mass Index (BMI) 40.0 and over, adult: Secondary | ICD-10-CM | POA: Diagnosis not present

## 2018-08-30 DIAGNOSIS — N800 Endometriosis of uterus: Secondary | ICD-10-CM | POA: Diagnosis not present

## 2018-08-30 DIAGNOSIS — I10 Essential (primary) hypertension: Secondary | ICD-10-CM | POA: Diagnosis not present

## 2018-08-30 DIAGNOSIS — K429 Umbilical hernia without obstruction or gangrene: Secondary | ICD-10-CM | POA: Diagnosis not present

## 2018-08-30 DIAGNOSIS — Z880 Allergy status to penicillin: Secondary | ICD-10-CM | POA: Diagnosis not present

## 2018-08-30 DIAGNOSIS — G4730 Sleep apnea, unspecified: Secondary | ICD-10-CM | POA: Diagnosis not present

## 2018-08-30 DIAGNOSIS — D649 Anemia, unspecified: Secondary | ICD-10-CM | POA: Diagnosis not present

## 2018-08-30 LAB — CBC
HCT: 34.8 % — ABNORMAL LOW (ref 36.0–46.0)
Hemoglobin: 10.9 g/dL — ABNORMAL LOW (ref 12.0–15.0)
MCH: 28.2 pg (ref 26.0–34.0)
MCHC: 31.3 g/dL (ref 30.0–36.0)
MCV: 89.9 fL (ref 78.0–100.0)
Platelets: 292 10*3/uL (ref 150–400)
RBC: 3.87 MIL/uL (ref 3.87–5.11)
RDW: 14.3 % (ref 11.5–15.5)
WBC: 9.4 10*3/uL (ref 4.0–10.5)

## 2018-08-30 LAB — COMPREHENSIVE METABOLIC PANEL
ALT: 25 U/L (ref 0–44)
AST: 27 U/L (ref 15–41)
Albumin: 2.9 g/dL — ABNORMAL LOW (ref 3.5–5.0)
Alkaline Phosphatase: 63 U/L (ref 38–126)
Anion gap: 10 (ref 5–15)
BUN: 9 mg/dL (ref 6–20)
CO2: 27 mmol/L (ref 22–32)
Calcium: 8.8 mg/dL — ABNORMAL LOW (ref 8.9–10.3)
Chloride: 104 mmol/L (ref 98–111)
Creatinine, Ser: 0.69 mg/dL (ref 0.44–1.00)
GFR calc Af Amer: >60 mL/min (ref >60)
GFR calc non Af Amer: >60 mL/min (ref >60)
Glucose, Bld: 100 mg/dL — ABNORMAL HIGH (ref 70–99)
Potassium: 4.3 mmol/L (ref 3.5–5.1)
Sodium: 141 mmol/L (ref 135–145)
Total Bilirubin: 0.4 mg/dL (ref 0.3–1.2)
Total Protein: 6 g/dL — ABNORMAL LOW (ref 6.5–8.1)

## 2018-08-30 MED ORDER — OXYCODONE HCL 5 MG PO TABS: 5.0000 mg | ORAL_TABLET | Freq: Four times a day (QID) | ORAL | 0 refills | Status: DC | PRN

## 2018-08-30 NOTE — Progress Notes (Signed)
Assessment unchanged. Pt verbalized understanding of dc instructions through teach back including follow up care, drain care, and when to call the doctor. Script x 1 at dc. Discharged via wc to front entrance accompanied by NT and husband.

## 2018-08-30 NOTE — Discharge Instructions (Signed)
[facility] ______[facility] °LAPAROSCOPIC SURGERY: POST OP INSTRUCTIONS °Always review your discharge instruction sheet given to you by the facility where your surgery was performed. °IF YOU HAVE DISABILITY OR FAMILY LEAVE FORMS, YOU MUST BRING THEM TO THE OFFICE FOR PROCESSING.   °DO NOT GIVE THEM TO YOUR DOCTOR. ° °1. A prescription for pain medication may be given to you upon discharge.  Take your pain medication as prescribed, if needed.  If narcotic pain medicine is not needed, then you may take acetaminophen (Tylenol) or ibuprofen (Advil) as needed. °2. Take your usually prescribed medications unless otherwise directed. °3. If you need a refill on your pain medication, please contact your pharmacy.  They will contact our office to request authorization. Prescriptions will not be filled after 5pm or on week-ends. °4. You should follow a light diet the first few days after arrival home, such as soup and crackers, etc.  Be sure to include lots of fluids daily. °5. Most patients will experience some swelling and bruising in the area of the incisions.  Ice packs will help.  Swelling and bruising can take several days to resolve.  °6. It is common to experience some constipation if taking pain medication after surgery.  Increasing fluid intake and taking a stool softener (such as Colace) will usually help or prevent this problem from occurring.  A mild laxative (Milk of Magnesia or Miralax) should be taken according to package instructions if there are no bowel movements after 48 hours. °7. Unless discharge instructions indicate otherwise, you may remove your bandages 24-48 hours after surgery, and you may shower at that time.  You may have steri-strips (small skin tapes) in place directly over the incision.  These strips should be left on the skin for 7-10 days.  If your surgeon used skin glue on the incision, you may shower in 24 hours.  The glue will flake off over the next 2-3 weeks.  Any sutures or  staples will be removed at the office during your follow-up visit. °8. ACTIVITIES:  You may resume regular (light) daily activities beginning the next day--such as daily self-care, walking, climbing stairs--gradually increasing activities as tolerated.  You may have sexual intercourse when it is comfortable.  Refrain from any heavy lifting or straining until approved by your doctor. °a. You may drive when you are no longer taking prescription pain medication, you can comfortably wear a seatbelt, and you can safely maneuver your car and apply brakes. °b. RETURN TO WORK:  __________________________________________________________ °9. You should see your doctor in the office for a follow-up appointment approximately 2-3 weeks after your surgery.  Make sure that you call for this appointment within a day or two after you arrive home to insure a convenient appointment time. °10. OTHER INSTRUCTIONS: __________________________________________________________________________________________________________________________ __________________________________________________________________________________________________________________________ °WHEN TO CALL YOUR DOCTOR: °1. Fever over 101.0 °2. Inability to urinate °3. Continued bleeding from incision. °4. Increased pain, redness, or drainage from the incision. °5. Increasing abdominal pain ° °The clinic staff is available to answer your questions during regular business hours.  Please don’t hesitate to call and ask to speak to one of the nurses for clinical concerns.  If you have a medical emergency, go to the nearest emergency room or call 911.  A surgeon from [facility] is always on call at the hospital. °[address], [address], [place], [place]  [address] ? [address] [address], , [place]   [address] °[phone] ? [phone] ? FAX [phone] °Web site:   [email or web address] °

## 2018-08-30 NOTE — Discharge Summary (Signed)
Physician Discharge Summary  Patient ID: Alexis Wall MRN: [id number] DOB/AGE: [birth year] 52 y.o.  Admit date: 08/27/2018 Discharge date: 08/30/2018  Admission Diagnoses: Chronic cholecystitis  Discharge Diagnoses:  Principal Problem:   Acute on chronic cholecystitis s/p lap cholecystectomy 08/27/2018 Active Problems:   Morbid obesity, Weight - 239, BMI - 41.07.   Hypertension   GERD (gastroesophageal reflux disease)   Bipolar disorder ([place])   Sleep apnea   Anemia   Discharged Condition: good  Hospital Course: Patient was admitted after surgery.  Her diet was advanced as tolerated.  She was discharged home once tolerating a diet and pain was controlled with oral medications.  She will be discharged with a JP drain in place and will follow-up next week in the office for drain removal.  Consults: None  Significant Diagnostic Studies: labs: cbc, cmet  Treatments: IV hydration, analgesia: Vicodin and surgery: lap chole  Discharge Exam: Blood pressure (!) 151/87, pulse 60, temperature 98.7 F (37.1 C), temperature source Oral, resp. rate 18, height 5\' 4"  (1.626 m), weight 113.6 kg, SpO2 95 %. General appearance: alert and cooperative GI: normal findings: soft, non-tender Incision/Wound: clean, dry, intact, mild periumbilical erythema  Disposition: Discharge disposition: 01-Home or Self Care         Follow-up Information    [facility] Follow up.   Specialty:  General Surgery Why:  office with call with follow up time to pull your drain.  If you don't hear by Monday, call and ask about appointment to have drain pulled and to see Dr. Excell Seltzer. Contact information: [address] [place] [address] [phone]        Leanna Battles, MD Follow up.   Specialty:  Internal Medicine Why:  Let him know you had surgery and follow up for medical issues. Contact information: [address] [place] [address] [phone]            Signed: Rosario Adie 81/11/313, 9:45 AM

## 2018-10-14 DIAGNOSIS — I10 Essential (primary) hypertension: Secondary | ICD-10-CM | POA: Diagnosis not present

## 2018-10-14 DIAGNOSIS — Z0000 Encounter for general adult medical examination without abnormal findings: Secondary | ICD-10-CM | POA: Diagnosis not present

## 2018-10-14 DIAGNOSIS — R82998 Other abnormal findings in urine: Secondary | ICD-10-CM | POA: Diagnosis not present

## 2018-10-21 DIAGNOSIS — O24419 Gestational diabetes mellitus in pregnancy, unspecified control: Secondary | ICD-10-CM | POA: Diagnosis not present

## 2018-10-21 DIAGNOSIS — I10 Essential (primary) hypertension: Secondary | ICD-10-CM | POA: Diagnosis not present

## 2018-10-21 DIAGNOSIS — Z0000 Encounter for general adult medical examination without abnormal findings: Secondary | ICD-10-CM | POA: Diagnosis not present

## 2018-10-21 DIAGNOSIS — Z23 Encounter for immunization: Secondary | ICD-10-CM | POA: Diagnosis not present

## 2018-10-21 DIAGNOSIS — F418 Other specified anxiety disorders: Secondary | ICD-10-CM | POA: Diagnosis not present

## 2018-11-09 DIAGNOSIS — J018 Other acute sinusitis: Secondary | ICD-10-CM | POA: Diagnosis not present

## 2018-11-09 DIAGNOSIS — R509 Fever, unspecified: Secondary | ICD-10-CM | POA: Diagnosis not present

## 2019-03-17 DIAGNOSIS — G4733 Obstructive sleep apnea (adult) (pediatric): Secondary | ICD-10-CM | POA: Diagnosis not present

## 2019-03-17 DIAGNOSIS — I10 Essential (primary) hypertension: Secondary | ICD-10-CM | POA: Diagnosis not present

## 2019-12-22 ENCOUNTER — Other Ambulatory Visit: Payer: Self-pay | Admitting: *Deleted

## 2019-12-22 ENCOUNTER — Other Ambulatory Visit: Payer: Self-pay | Admitting: Women's Health

## 2019-12-22 DIAGNOSIS — Z1231 Encounter for screening mammogram for malignant neoplasm of breast: Secondary | ICD-10-CM

## 2019-12-25 DIAGNOSIS — E7849 Other hyperlipidemia: Secondary | ICD-10-CM | POA: Diagnosis not present

## 2019-12-25 DIAGNOSIS — I10 Essential (primary) hypertension: Secondary | ICD-10-CM | POA: Diagnosis not present

## 2019-12-25 DIAGNOSIS — Z0000 Encounter for general adult medical examination without abnormal findings: Secondary | ICD-10-CM | POA: Diagnosis not present

## 2020-01-01 DIAGNOSIS — R635 Abnormal weight gain: Secondary | ICD-10-CM | POA: Diagnosis not present

## 2020-01-01 DIAGNOSIS — Z1331 Encounter for screening for depression: Secondary | ICD-10-CM | POA: Diagnosis not present

## 2020-01-01 DIAGNOSIS — R0609 Other forms of dyspnea: Secondary | ICD-10-CM | POA: Diagnosis not present

## 2020-01-01 DIAGNOSIS — Z0000 Encounter for general adult medical examination without abnormal findings: Secondary | ICD-10-CM | POA: Diagnosis not present

## 2020-01-01 DIAGNOSIS — M545 Low back pain: Secondary | ICD-10-CM | POA: Diagnosis not present

## 2020-01-07 ENCOUNTER — Encounter: Payer: Self-pay | Admitting: Neurology

## 2020-01-07 ENCOUNTER — Ambulatory Visit: Payer: BC Managed Care – PPO | Admitting: Neurology

## 2020-01-07 ENCOUNTER — Other Ambulatory Visit: Payer: Self-pay

## 2020-01-07 VITALS — BP 128/84 | HR 76 | Temp 97.1°F | Ht 64.0 in | Wt 267.0 lb

## 2020-01-07 DIAGNOSIS — Z6841 Body Mass Index (BMI) 40.0 and over, adult: Secondary | ICD-10-CM

## 2020-01-07 DIAGNOSIS — I10 Essential (primary) hypertension: Secondary | ICD-10-CM

## 2020-01-07 DIAGNOSIS — K219 Gastro-esophageal reflux disease without esophagitis: Secondary | ICD-10-CM | POA: Diagnosis not present

## 2020-01-07 DIAGNOSIS — G4733 Obstructive sleep apnea (adult) (pediatric): Secondary | ICD-10-CM | POA: Diagnosis not present

## 2020-01-07 NOTE — Progress Notes (Addendum)
SLEEP MEDICINE CLINIC    Provider:  Larey Seat, MD  Primary Care Physician:  Leanna Battles, MD [place] [address]     Referring Provider: Leanna Battles, [place],  [place] [address]          Chief Complaint according to patient   Patient presents with:    . New Patient (Initial Visit)     pt has a machine that she uses. She had her last SS completed in 2015 through here. she has been unable to use the machine due to not having a good mask. interested in restarting therapy. unsure where she was set up with DME      HISTORY OF PRESENT ILLNESS:  Alexis Wall is a 54 y.o. Caucasian female patient seen here upon referral on 01/07/2020 from Dr. Philip Aspen.:  Chief concern according to patient :  She needs supplies and possibly a new CPAP  machine   Her last visit with [place] was for a SPLIT night PSG  on February 26, 2014.  The patient was 54 years old at the time, had a known diagnosis of apnea but broke some days still with headaches, insomnia was a bit of a concern and fatigue.  Epworth Sleepiness Scale was endorsed at 7 out of 24 points at the time her BMI was 43.  Neck circumference was 17, in her baseline study she reached an AHI of 60.5/h and all her events were non-REM sleep related.  She also retain CO2 to a maximum of 4312-hour.  Supine AHI was surprisingly lower than nonsupine AHI.  Lowest oxygen at nadir was 87% there were 10.8 minutes of desaturation time, she was titrated to the same night to 8 cmH2O and used a nasal pillow ever since.  She is continue to use her CPAP since.  And I am able to look at a compliance download his data encompassing 90 days from October 2020 through the mid January of this year.    Apparently she could only use the CPAP 1 day because she has no longer working interface.  She has been set up with nasal pillows but these are brittle and she needs a new order, would like a fitting for a different type of  interface.   She is also due for a new machine.  Since her machine is still working as it seems she cannot keep the machine for travel or for second home.  She reports the machine is working, the mask is not.    She has seen Dr. Sharlett Iles the last time on 17 March 2019 in a telephone medicine visit, she left a telephone note to be referred.     I have the pleasure of seeing Alexis Wall today, a right -handed White or Caucasian female with known OSA, previously severe degree of apnea as dx in 2015- at [facility]. She  has a past medical history of Anemia, Arthritis, Bipolar disorder ([place]), Depression, Diabetes mellitus, GERD (gastroesophageal reflux disease), Hypertension, Morbid obesity ([place]), Polyp of colon, and Sleep apnea. Patient is currently on Zestoretic, she takes Ambien as needed, omeprazole, fluoxetine, verapamil.  Past medical history is positive for osteoarthritis of the knees, morbid obesity, she does take hypertension medicine.  She has an Omron digital monitor at home.  She also has GERD. No longer reporting headaches after she started with CPAP. Patient had Dx of only mild OSA when tested first time in 2009 .  The patient underwent  Cholecystectomy with Dr Adonis Housekeeper in October 2020 I reviewed her OR notes and surgical history and Dr. Roderic Scarce anaesthesia notes. She was not using her CPAP regularly at the time, according to our wi-fi accessed data.     Sleep relevant medical history: Nocturia: 2-4  Sleep talking , Night terrors,    Family medical /sleep history: father passed away in 06/12/2023 , he had OSA as did her paternal uncle.    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 Family status is married with 2 children.  Pets are present. Tobacco use- quit 1980 .  ETOH use socially,  Caffeine intake in form of Coffee( 2  am  ) Soda( trying to quit , 1 coke a day ) Tea ( in pm, decaffeinated. ). She does not consume  energy drinks.      Sleep habits are as follows: The patient's dinner time is between 5.30 PM. The patient goes to bed at 10 PM and reads, asleep by 11.30 , she continues to sleep for 2-3 hours, wakes for her many bathroom breaks.   The preferred sleep position is side ways, with the support of 2 pillows.  Dreams are reportedly rare- (" has not always been this way") 7.30 AM is the usual rise time. The patient wakes up spontaneously.  She reports some days not feeling refreshed or restored in AM, waking up with symptoms such as dry mouth, sometimes with  morning headaches, and often with residual fatigue.  Naps are taken on weekends frequently, lasting from 15- 30 minutes and are more refreshing than nocturnal sleep.    Review of Systems: Out of a complete 14 system review, the patient complains of only the following symptoms, and all other reviewed systems are negative.:  Fatigue, some sleepiness ,fragmented sleep, nocturia, snoring. No RLS, some morning headaches, dry mouth.     How likely are you to doze in the following situations: 0 = not likely, 1 = slight chance, 2 = moderate chance, 3 = high chance   Sitting and Reading? Watching Television? Sitting inactive in a public place (theater or meeting)? As a passenger in a car for an hour without a break? Lying down in the afternoon when circumstances permit? Sitting and talking to someone? Sitting quietly after lunch without alcohol? In a car, while stopped for a few minutes in traffic?   Total = 4 / 24 points   FSS endorsed at 35 / 63 points.   Depression No flowsheet data found.    Social History   Socioeconomic History  . Marital status: Married    Spouse name: Not on file  . Number of children: Not on file  . Years of education: Not on file  . Highest education level: Not on file  Occupational History  . Not on file   Tobacco Use  . Smoking status: Former Smoker    Quit date: 05/03/1979    Years since quitting: 40.7  . Smokeless tobacco: Never Used  Substance and Sexual Activity  . Alcohol use: Yes    Alcohol/week: 1.0 standard drinks    Types: 1 Glasses of wine per week    Comment: occasional glass of wine once per week  . Drug use: No  . Sexual activity: Yes    Comment: TAH  Other Topics Concern  .  Not on file  Social History Narrative  . Not on file   Social Determinants of Health   Financial Resource Strain:   . Difficulty of Paying Living Expenses: Not on file  Food Insecurity:   . Worried About Charity fundraiser in the Last Year: Not on file  . Ran Out of Food in the Last Year: Not on file  Transportation Needs:   . Lack of Transportation (Medical): Not on file  . Lack of Transportation (Non-Medical): Not on file  Physical Activity:   . Days of Exercise per Week: Not on file  . Minutes of Exercise per Session: Not on file  Stress:   . Feeling of Stress : Not on file  Social Connections:   . Frequency of Communication with Friends and Family: Not on file  . Frequency of Social Gatherings with Friends and Family: Not on file  . Attends Religious Services: Not on file  . Active Member of Clubs or Organizations: Not on file  . Attends Archivist Meetings: Not on file  . Marital Status: Not on file    Family History  Problem Relation Age of Onset  . Hypertension Father   . Obesity Father   . Cancer Maternal Grandfather        colon  . Sleep apnea Mother   . Heart disease Paternal Grandfather   . Colon cancer Neg Hx     Past Medical History:  Diagnosis Date  . Anemia   . Arthritis    pains in knee  . Bipolar disorder ([place])    per Dr D.Patterson's office note 07/08/2007  . Depression    [facility]  . Diabetes mellitus    [facility]-not now  . GERD (gastroesophageal reflux disease)   . Hypertension   . Morbid obesity ([place])   . Polyp of colon   .  Sleep apnea    Wears CPAP    Past Surgical History:  Procedure Laterality Date  . ABDOMINAL HYSTERECTOMY  03/21/2010   TAH  . BREATH TEK H PYLORI  08/05/2012   Procedure: BREATH TEK H PYLORI;  Surgeon: Pedro Earls, MD;  Location: [facility];  Service: General;  Laterality: N/A;  745  . CESAREAN SECTION  01/26/1999, 09/30/2002   X2.Marland Kitchen WITH BTSP  . CHOLECYSTECTOMY N/A 08/27/2018   Procedure: LAPAROSCOPIC CHOLECYSTECTOMY WITH INTRAOPERATIVE CHOLANGIOGRAM;  Surgeon: Excell Seltzer, MD;  Location: [facility];  Service: General;  Laterality: N/A;  . ENDOMETRIAL ABLATION  08/25/2009   HER OPTION      Current Outpatient Medications on File Prior to Visit  Medication Sig Dispense Refill  . FLUoxetine (PROZAC) 40 MG capsule Take 40 mg by mouth at bedtime.    Marland Kitchen omeprazole (PRILOSEC) 20 MG capsule Take 20 mg by mouth at bedtime.     . verapamil (VERELAN PM) 180 MG 24 hr capsule Take 180 mg by mouth at bedtime.  11  . zolpidem (AMBIEN) 10 MG tablet Take 10 mg by mouth at bedtime.      No current facility-administered medications on file prior to visit.    Allergies  Allergen Reactions  . Penicillins Rash    Happened in childhood, does not remember if it spread all over her body or not. Has patient had a PCN reaction causing immediate rash, facial/tongue/throat swelling, SOB or lightheadedness with hypotension: no Has patient had a PCN reaction causing severe rash involving mucus membranes or skin necrosis: no Has patient had a PCN reaction that required hospitalization  no Has patient had a PCN reaction occurring within the last 10 years: no If all of the above answers are "NO", then may proceed with C    Physical exam:  Today's Vitals   01/07/20 1509  BP: 128/84  Pulse: 76  Temp: (!) 97.1 F (36.2 C)  Weight: 267 lb (121.1 kg)  Height: 5\' 4"  (1.626 m)   Body mass index is 45.83 kg/m.   Wt Readings from Last 3 Encounters:  01/07/20 267 lb (121.1 kg)  08/27/18 250 lb 6 oz  (113.6 kg)  08/25/18 250 lb 6 oz (113.6 kg)     Ht Readings from Last 3 Encounters:  01/07/20 5\' 4"  (1.626 m)  08/27/18 5\' 4"  (1.626 m)  08/25/18 5\' 4"  (1.626 m)      General: The patient is awake, alert and appears not in acute distress. The patient is well groomed. Head: Normocephalic, atraumatic. Neck is supple. Mallampati 3,  neck circumference: 18 inches.  Nasal airflow is patent. Retrognathia is not seen.  Dental status: intact - no bruxism marks.  Cardiovascular:  Regular rate and cardiac rhythm by pulse, without distended neck veins. Respiratory: Lungs are clear .  Skin:  Without evidence of mild ankle edema or rash. Trunk: The patient's posture is erect.   Neurologic exam : The patient is awake and alert, oriented to place and time.   Memory subjective described as intact.  Attention span & concentration ability appears normal.  Speech is fluent,  without  dysarthria, dysphonia or aphasia.  Mood and affect are appropriate, pleasant and cooperative.   Cranial nerves: no loss of smell or taste reported.  Pupils are equal and briskly reactive to light. Funduscopic exam deferred.  Extraocular movements in vertical and horizontal planes were intact and without nystagmus. No Diplopia. Visual fields by finger perimetry are intact. Hearing was intact to soft voice and finger rubbing. acial sensation intact to fine touch. Facial motor strength is symmetric and tongue and uvula move midline.  Neck ROM : rotation, tilt and flexion extension were normal for age and shoulder shrug was symmetrical.    Motor exam:  Symmetric bulk, tone and ROM.   Normal tone without cog- wheeling, and providing symmetric grip strength . Sensory:  Fine touch, pinprick and vibration were normal.  Proprioception tested in the upper extremities was normal.  Coordination: no changes in penmanship.Rapid alternating movements in the fingers/hands were of normal speed.  The Finger-to-nose maneuver was intact  without evidence of ataxia, dysmetria or tremor. No changes in penmanship. Gait and station: Patient could rise unassisted from a seated position, walked without assistive device.  Toe and heel walk were deferred.  Deep tendon reflexes: in the  upper and lower extremities are symmetric and intact.  Babinski response was deferred .      After spending a total time of 30 minutes face to face and additional time for physical and neurologic examination, review of laboratory studies, recent surgical history ( anesthesia mentioned OSA diagnosis but was not aware of poor compliance at the time of surgery). personal review of reports and results of other testing and review of referral information / records as far as provided in visit, I have established the following assessments:  1) I had the pleasure of having seen Mrs. Novinger almost 6 years ago before her first sleep study in our lab.  At the time her BMI was morbid obesity grade 3- similar to today's she actually increased slightly from 43-46.  However she has  not reported an increase in fatigue and sleepiness in comparison to the data we obtained then.   She had severe sleep apnea at the time and I think it is reasonable to assume that this is not gone by the [facility].   To order a new functioning CPAP with a  new interface for her, I will repeat a home sleep test to confirm the current level of apnea.  Her CPAP machine is still working and she will need a new mask until she is ready to receive the new machine.    She used a nasal pillow but has not been happy with it recently I would suggest an ESON  nasal mask that would cover the nose  or a DreamWear full face mask which will cover the mouth and sits under the nose, either with a cradle or prongs to the nose, thus not interfering with the visual field.     My Plan is to proceed with:  1) HST 2) if apnea confirmed,this will be  followed by order for an autotitration device.  3) I will send an order  to advanced home care to ask for a ResMed F30 I mask or ESON mask in the interval.   I would like to thank Leanna Battles, MD , [address] [place],  [place] [address] for allowing me to meet with and to take care of this pleasant patient.   In short, Alexis Wall is presenting with known OSA and owns a CPAP- but cannot use it because the mask doesn't fit her anymore, and CPAP compliance was poor over the last 90 days, not allowing for an automatic DME order of supplies.   I plan to follow up either personally or through our NP within 2-3  month.   CC: I will share my notes with Dr. Philip Aspen  and with Elon Alas ,NP .   Electronically signed by: Larey Seat, MD 01/07/2020 3:20 PM  [facility] [place] certified by The [employer] of Sleep Medicine and Diplomate of the [employer] of Sleep Medicine. Board certified In Neurology through the [place], Fellow of the [employer] of Neurology. Medical Director of [employer].

## 2020-01-07 NOTE — Patient Instructions (Signed)
Obesity Hypoventilation Syndrome  Obesity hypoventilation syndrome (OHS) means that you are not breathing well enough to get air in and out of your lungs efficiently (ventilation). This causes a low oxygen level and a high carbon dioxide level in your blood (hypoventilation). Having too much total body fat (obesity) is a significant risk factor for developing OHS. OHS makes it harder for your heart to pump oxygen-rich blood to your body. It can cause sleep disturbances and make you feel sleepy during the day. Over time, OHS can increase your risk for:  Heart disease.  High blood pressure (hypertension).  Reduced ability to absorb sugar from the bloodstream (insulin resistance).  Heart failure. Over time, OHS weakens your heart and can lead to heart failure. What are the causes? The exact cause of OHS is not known. Possible causes include:  Pressure on the lungs from excess body weight.  Obesity-related changes in how much air the lungs can hold (lung capacity) and how much they can expand (lung compliance).  Failure of the brain to regulate oxygen and carbon dioxide levels properly.  Chemicals (hormones) produced by excess fat cells interfering with breathing regulation.  A breathing condition in which breathing pauses or becomes shallow during sleep (sleep apnea). This condition can eventually cause the body to ventilate poorly and to hold onto carbon dioxide during the day. What increases the risk? You may have a greater risk for OHS if you:  Have a BMI of 30 or higher. BMI is an estimate of body fat that is calculated from height and weight. For adults, a BMI of 30 or higher is considered obese.  Are 40?55 years old.  Carry most of your excess weight around your waist.  Experience moderate symptoms of sleep apnea. What are the signs or symptoms? The most common symptoms of OHS are:  Daytime sleepiness.  Lack of energy.  Shortness of breath.  Morning headaches.  Sleep  apnea.  Trouble concentrating.  Irritability, mood swings, or depression.  Swollen veins in the neck.  Swelling of the legs. How is this diagnosed? Your health care provider may suspect OHS if you are obese and have poor breathing during the day and at night. Your health care provider will also do a physical exam. You may have tests to:  Measure your BMI.  Measure your blood oxygen level with a sensor placed on your finger (pulse oximetry).  Measure blood oxygen and carbon dioxide in a blood sample.  Measure the amount of red blood cells in a blood sample. OHS causes the number of red blood cells you have to increase (polycythemia).  Check your breathing ability (pulmonary function testing).  Check your breathing ability, breathing patterns, and oxygen level while you sleep (sleep study). You may also have a chest X-ray to rule out other breathing problems. You may have an electrocardiogram (ECG) and or echocardiogram to check for signs of heart failure. How is this treated? Weight loss is the most important part of treatment for OHS, and it may be the only treatment that you need. Other treatments may include:  Using a device to open your airway while you sleep, such as a continuous positive airway pressure (CPAP) machine that delivers oxygen to your airway through a mask.  Surgery (gastric bypass surgery) to lower your BMI. This may be needed if: ? You are very obese. ? Other treatments have not worked for you. ? Your OHS is very severe and is causing organ damage, such as heart failure. Follow these   instructions at home:  Medicines  Take over-the-counter and prescription medicines only as told by your health care provider.  Ask your health care provider what medicines are safe for you. You may be told to avoid medicines that can impair breathing and make OHS worse, such as sedatives and narcotics. Sleeping habits  If you are prescribed a CPAP machine, make sure you  understand and use the machine as directed.  Try to get 8 hours of sleep every night.  Go to bed at the same time every night, and get up at the same time every day. General instructions  Work with your health care provider to make a diet and exercise plan that helps you reach and maintain a healthy weight.  Eat a healthy diet.  Avoid smoking.  Exercise regularly as told by your health care provider.  During the evening, do not drink caffeine and do not eat heavy meals.  Keep all follow-up visits as told by your health care provider. This is important. Contact a health care provider if:  You experience new or worsening shortness of breath.  You have chest pain.  You have an irregular heartbeat (palpitations).  You have dizziness.  You faint.  You develop a cough.  You have a fever.  You have chest pain when you breathe (pleurisy). This information is not intended to replace advice given to you by your health care provider. Make sure you discuss any questions you have with your health care provider. Document Revised: 03/06/2019 Document Reviewed: 04/23/2016 Elsevier Patient Education  2020 [employer]  

## 2020-01-27 ENCOUNTER — Other Ambulatory Visit: Payer: Self-pay

## 2020-01-27 ENCOUNTER — Ambulatory Visit
Admission: RE | Admit: 2020-01-27 | Discharge: 2020-01-27 | Disposition: A | Discharge status: Home / Self Care | Payer: BC Managed Care – PPO | Source: Ambulatory Visit | Attending: Women's Health | Admitting: Women's Health

## 2020-01-27 DIAGNOSIS — Z1231 Encounter for screening mammogram for malignant neoplasm of breast: Secondary | ICD-10-CM | POA: Diagnosis not present

## 2020-02-01 ENCOUNTER — Ambulatory Visit (INDEPENDENT_AMBULATORY_CARE_PROVIDER_SITE_OTHER): Payer: BC Managed Care – PPO | Admitting: Neurology

## 2020-02-01 DIAGNOSIS — K219 Gastro-esophageal reflux disease without esophagitis: Secondary | ICD-10-CM

## 2020-02-01 DIAGNOSIS — I10 Essential (primary) hypertension: Secondary | ICD-10-CM

## 2020-02-01 DIAGNOSIS — G4733 Obstructive sleep apnea (adult) (pediatric): Secondary | ICD-10-CM

## 2020-02-08 ENCOUNTER — Other Ambulatory Visit: Payer: Self-pay

## 2020-02-09 ENCOUNTER — Encounter: Payer: Self-pay | Admitting: Women's Health

## 2020-02-09 ENCOUNTER — Ambulatory Visit (INDEPENDENT_AMBULATORY_CARE_PROVIDER_SITE_OTHER): Payer: BC Managed Care – PPO | Admitting: Women's Health

## 2020-02-09 VITALS — BP 134/80 | Ht 64.0 in | Wt 272.0 lb

## 2020-02-09 DIAGNOSIS — Z01419 Encounter for gynecological examination (general) (routine) without abnormal findings: Secondary | ICD-10-CM | POA: Diagnosis not present

## 2020-02-09 MED ORDER — PREMARIN 0.625 MG/GM VA CREA: TOPICAL_CREAM | VAGINAL | 12 refills | Status: DC

## 2020-02-09 NOTE — Patient Instructions (Addendum)
Vit D 2000 iu daily  good to meet you Carbohydrate Counting for Diabetes Mellitus, Adult  Carbohydrate counting is a method of keeping track of how many carbohydrates you eat. Eating carbohydrates naturally increases the amount of sugar (glucose) in the blood. Counting how many carbohydrates you eat helps keep your blood glucose within normal limits, which helps you manage your diabetes (diabetes mellitus). It is important to know how many carbohydrates you can safely have in each meal. This is different for every person. A diet and nutrition specialist (registered dietitian) can help you make a meal plan and calculate how many carbohydrates you should have at each meal and snack. Carbohydrates are found in the following foods:  Grains, such as breads and cereals.  Dried beans and soy products.  Starchy vegetables, such as potatoes, peas, and corn.  Fruit and fruit juices.  Milk and yogurt.  Sweets and snack foods, such as cake, cookies, candy, chips, and soft drinks. How do I count carbohydrates? There are two ways to count carbohydrates in food. You can use either of the methods or a combination of both. Reading "Nutrition Facts" on packaged food The "Nutrition Facts" list is included on the labels of almost all packaged foods and beverages in the [place] It includes:  The serving size.  Information about nutrients in each serving, including the grams (g) of carbohydrate per serving. To use the "Nutrition Facts":  Decide how many servings you will have.  Multiply the number of servings by the number of carbohydrates per serving.  The resulting number is the total amount of carbohydrates that you will be having. Learning standard serving sizes of other foods When you eat carbohydrate foods that are not packaged or do not include "Nutrition Facts" on the label, you need to measure the servings in order to count the amount of carbohydrates:  Measure the foods that you will eat with a  food scale or measuring cup, if needed.  Decide how many standard-size servings you will eat.  Multiply the number of servings by 15. Most carbohydrate-rich foods have about 15 g of carbohydrates per serving. ? For example, if you eat 8 oz (170 g) of strawberries, you will have eaten 2 servings and 30 g of carbohydrates (2 servings x 15 g = 30 g).  For foods that have more than one food mixed, such as soups and casseroles, you must count the carbohydrates in each food that is included. The following list contains standard serving sizes of common carbohydrate-rich foods. Each of these servings has about 15 g of carbohydrates:   hamburger bun or  English muffin.   oz (15 mL) syrup.   oz (14 g) jelly.  1 slice of bread.  1 six-inch tortilla.  3 oz (85 g) cooked rice or pasta.  4 oz (113 g) cooked dried beans.  4 oz (113 g) starchy vegetable, such as peas, corn, or potatoes.  4 oz (113 g) hot cereal.  4 oz (113 g) mashed potatoes or  of a large baked potato.  4 oz (113 g) canned or frozen fruit.  4 oz (120 mL) fruit juice.  4-6 crackers.  6 chicken nuggets.  6 oz (170 g) unsweetened dry cereal.  6 oz (170 g) plain fat-free yogurt or yogurt sweetened with artificial sweeteners.  8 oz (240 mL) milk.  8 oz (170 g) fresh fruit or one small piece of fruit.  24 oz (680 g) popped popcorn. Example of carbohydrate counting Sample meal  3  oz (85 g) chicken breast.  6 oz (170 g) brown rice.  4 oz (113 g) corn.  8 oz (240 mL) milk.  8 oz (170 g) strawberries with sugar-free whipped topping. Carbohydrate calculation 1. Identify the foods that contain carbohydrates: ? Rice. ? Corn. ? Milk. ? Strawberries. 2. Calculate how many servings you have of each food: ? 2 servings rice. ? 1 serving corn. ? 1 serving milk. ? 1 serving strawberries. 3. Multiply each number of servings by 15 g: ? 2 servings rice x 15 g = 30 g. ? 1 serving corn x 15 g = 15 g. ? 1 serving  milk x 15 g = 15 g. ? 1 serving strawberries x 15 g = 15 g. 4. Add together all of the amounts to find the total grams of carbohydrates eaten: ? 30 g + 15 g + 15 g + 15 g = 75 g of carbohydrates total. Summary  Carbohydrate counting is a method of keeping track of how many carbohydrates you eat.  Eating carbohydrates naturally increases the amount of sugar (glucose) in the blood.  Counting how many carbohydrates you eat helps keep your blood glucose within normal limits, which helps you manage your diabetes.  A diet and nutrition specialist (registered dietitian) can help you make a meal plan and calculate how many carbohydrates you should have at each meal and snack. This information is not intended to replace advice given to you by your health care provider. Make sure you discuss any questions you have with your health care provider. Document Revised: 06/06/2017 Document Reviewed: 04/25/2016 Elsevier Patient Education  2020 [place] Maintenance for Postmenopausal Women Menopause is a normal process in which your ability to get pregnant comes to an end. This process happens slowly over many months or years, usually between the ages of 66 and 47. Menopause is complete when you have missed your menstrual periods for 12 months. It is important to talk with your health care provider about some of the most common conditions that affect women after menopause (postmenopausal women). These include heart disease, cancer, and bone loss (osteoporosis). Adopting a healthy lifestyle and getting preventive care can help to promote your health and wellness. The actions you take can also lower your chances of developing some of these common conditions. What should I know about menopause? During menopause, you may get a number of symptoms, such as:  Hot flashes. These can be moderate or severe.  Night sweats.  Decrease in sex drive.  Mood  swings.  Headaches.  Tiredness.  Irritability.  Memory problems.  Insomnia. Choosing to treat or not to treat these symptoms is a decision that you make with your health care provider. Do I need hormone replacement therapy?  Hormone replacement therapy is effective in treating symptoms that are caused by menopause, such as hot flashes and night sweats.  Hormone replacement carries certain risks, especially as you become older. If you are thinking about using estrogen or estrogen with progestin, discuss the benefits and risks with your health care provider. What is my risk for heart disease and stroke? The risk of heart disease, heart attack, and stroke increases as you age. One of the causes may be a change in the body's hormones during menopause. This can affect how your body uses dietary fats, triglycerides, and cholesterol. Heart attack and stroke are medical emergencies. There are many things that you can do to help prevent heart disease and stroke. Watch your blood pressure  High blood pressure causes heart disease and increases the risk of stroke. This is more likely to develop in people who have high blood pressure readings, are of African descent, or are overweight.  Have your blood pressure checked: ? Every 3-5 years if you are 66-74 years of age. ? Every year if you are 69 years old or older. Eat a healthy diet   Eat a diet that includes plenty of vegetables, fruits, low-fat dairy products, and lean protein.  Do not eat a lot of foods that are high in solid fats, added sugars, or sodium. Get regular exercise Get regular exercise. This is one of the most important things you can do for your health. Most adults should:  Try to exercise for at least 150 minutes each week. The exercise should increase your heart rate and make you sweat (moderate-intensity exercise).  Try to do strengthening exercises at least twice each week. Do these in addition to the moderate-intensity  exercise.  Spend less time sitting. Even light physical activity can be beneficial. Other tips  Work with your health care provider to achieve or maintain a healthy weight.  Do not use any products that contain nicotine or tobacco, such as cigarettes, e-cigarettes, and chewing tobacco. If you need help quitting, ask your health care provider.  Know your numbers. Ask your health care provider to check your cholesterol and your blood sugar (glucose). Continue to have your blood tested as directed by your health care provider. Do I need screening for cancer? Depending on your health history and family history, you may need to have cancer screening at different stages of your life. This may include screening for:  Breast cancer.  Cervical cancer.  Lung cancer.  Colorectal cancer. What is my risk for osteoporosis? After menopause, you may be at increased risk for osteoporosis. Osteoporosis is a condition in which bone destruction happens more quickly than new bone creation. To help prevent osteoporosis or the bone fractures that can happen because of osteoporosis, you may take the following actions:  If you are 43-42 years old, get at least 1,000 mg of calcium and at least 600 mg of vitamin D per day.  If you are older than age 83 but younger than age 85, get at least 1,200 mg of calcium and at least 600 mg of vitamin D per day.  If you are older than age 58, get at least 1,200 mg of calcium and at least 800 mg of vitamin D per day. Smoking and drinking excessive alcohol increase the risk of osteoporosis. Eat foods that are rich in calcium and vitamin D, and do weight-bearing exercises several times each week as directed by your health care provider. How does menopause affect my mental health? Depression may occur at any age, but it is more common as you become older. Common symptoms of depression include:  Low or sad mood.  Changes in sleep patterns.  Changes in appetite or eating  patterns.  Feeling an overall lack of motivation or enjoyment of activities that you previously enjoyed.  Frequent crying spells. Talk with your health care provider if you think that you are experiencing depression. General instructions See your health care provider for regular wellness exams and vaccines. This may include:  Scheduling regular health, dental, and eye exams.  Getting and maintaining your vaccines. These include: ? Influenza vaccine. Get this vaccine each year before the flu season begins. ? Pneumonia vaccine. ? Shingles vaccine. ? Tetanus, diphtheria, and pertussis (Tdap) booster vaccine.  Your health care provider may also recommend other immunizations. Tell your health care provider if you have ever been abused or do not feel safe at home. Summary  Menopause is a normal process in which your ability to get pregnant comes to an end.  This condition causes hot flashes, night sweats, decreased interest in sex, mood swings, headaches, or lack of sleep.  Treatment for this condition may include hormone replacement therapy.  Take actions to keep yourself healthy, including exercising regularly, eating a healthy diet, watching your weight, and checking your blood pressure and blood sugar levels.  Get screened for cancer and depression. Make sure that you are up to date with all your vaccines. This information is not intended to replace advice given to you by your health care provider. Make sure you discuss any questions you have with your health care provider. Document Revised: 11/05/2018 Document Reviewed: 11/05/2018 Elsevier Patient Education  2020 [employer].

## 2020-02-09 NOTE — Progress Notes (Signed)
Alexis Wall Sep 14, 1966 [id number]    History:    Presents for annual exam.  2010/03/11 TAH for menorrhagia and fibroids on no HRT.  Normal Pap and mammogram history.  Primary care manages hypertension, anxiety and depression, prediabetes.  Mar 11, 2016 benign colon polyps 5-year follow-up.  Currently having issues with stress and urge incontinence wearing a pad daily.  Not sexually active reports good relationship with husband.  Past medical history, past surgical history, family history and social history were all reviewed and documented in the EPIC chart.  Desk job.  Daughter 15, son 51 both doing well and have had Gardasil.  Mother also problems with stress incontinence.  Father deceased heart disease.  03/11/2018 cholecystectomy.  ROS:  A ROS was performed and pertinent positives and negatives are included.  Exam:  Vitals:   02/09/20 1416  BP: 134/80  Weight: 272 lb (123.4 kg)  Height: 5\' 4"  (1.626 m)   Body mass index is 46.69 kg/m.   General appearance:  Normal Thyroid:  Symmetrical, normal in size, without palpable masses or nodularity. Respiratory  Auscultation:  Clear without wheezing or rhonchi Cardiovascular  Auscultation:  Regular rate, without rubs, murmurs or gallops  Edema/varicosities:  Not grossly evident Abdominal  Soft,nontender, without masses, guarding or rebound.  Liver/spleen:  No organomegaly noted  Hernia:  None appreciated  Skin  Inspection:  Grossly normal   Breasts: Examined lying and sitting.     Right: Without masses, retractions, discharge or axillary adenopathy.     Left: Without masses, retractions, discharge or axillary adenopathy. Gentitourinary   Inguinal/mons:  Normal without inguinal adenopathy  External genitalia:  Normal  BUS/Urethra/Skene's glands:  Normal  Vagina:  Normal  Cervix: And uterus absent   Adnexa/parametria:     Rt: Without masses or tenderness.   Lt: Without masses or tenderness.  Anus and perineum: Normal  Digital rectal  exam: Normal sphincter tone without palpated masses or tenderness  Assessment/Plan:  54 y.o. MWF G3 P2 for annual exam with no complaints of discharge, urinary symptoms or abdominal pain.   March 11, 2010 TA H for menorrhagia/fibroids on no HRT Stress/urge incontinence Hypertension, anxiety/depression and prediabetes-primary care manages labs and meds Obesity  Plan: Options for incontinence discussed, would like to try vaginal estrogen cream Premarin 0.65 Premarin cream daily for 5 nights and then 3 times weekly per vagina.  Reviewed importance of weight loss especially abdominal weight loss to help with incontinence.  Instructed to call if no relief after 1 to 2 months.  Bladder irritants also discussed, encouraged decreased caffeine.  Reviewed no cystocele or rectocele present.  SBEs, continue annual screening mammogram, calcium rich foods, vitamin D 2000 IUs daily encouraged.  Aware of importance of increasing weightbearing / regular exercise and decreasing calories/carbs.  Pap screening guidelines reviewed.     [place], 3:01 PM 02/09/2020

## 2020-02-10 DIAGNOSIS — Z6841 Body Mass Index (BMI) 40.0 and over, adult: Secondary | ICD-10-CM | POA: Insufficient documentation

## 2020-02-10 DIAGNOSIS — G4733 Obstructive sleep apnea (adult) (pediatric): Secondary | ICD-10-CM | POA: Insufficient documentation

## 2020-02-10 NOTE — Procedures (Signed)
Patient Information     First Name: Alexis Last Name: Wall ID: [id number]  Birth Date: 04-15-1966 Age: 54 Gender:  Referring Provider: Leanna Battles, MD BMI: 45.5 (W=267 lb, H=5' 4'')  Neck Circ.:  18 '' Epworth:  4/24   Sleep Study Information    Study Date: Feb 01, 2020 S/H/A Version: 001.001.001.001 / 4.1.1528 / 41  History:    [name], a right -handed White or Caucasian female with known OSA, previously severe degree of apnea as dx in 2015- at [facility]. She has a medical history of Anemia, Arthritis, Bipolar disorder ([place]), Depression, Diabetes mellitus, GERD (gastroesophageal reflux disease), Hypertension, Morbid obesity ([place]), Polyp of colon, and Sleep apnea. Patient is currently on Zestoretic, she takes Ambien as needed, omeprazole, fluoxetine, verapamil.  Past medical history is positive for osteoarthritis of the knees, morbid obesity, she does take hypertension medicine.  She has an Omron digital monitor at home.  She also has GERD. The patient underwent Cholecystectomy with Dr Adonis Housekeeper in October 2020 I reviewed her OR notes and surgical history and Dr. Roderic Scarce anaesthesia notes. She was not using her CPAP regularly at the time, according to our wi-fi accessed data. No longer reporting headaches after she started with CPAP. Patient had Dx of only mild OSA when tested first time in 2009, so the degree od apnea dx in 2015 was surprising. She will need a new machine.    Summary & Diagnosis:    Very severe obstructive sleep apnea with an AHI of 71.3/h(!) and associated with prolonged, severe desaturation of oxygen. Over 100 minutes of hypoxemia were recorded. This HST estimated 10 % central apneas.   Recommendations:     I much prefer an attended titration in the sleep lab- this patient has used CPAP for years but has new symptoms and comorbidities. The hypoxemia is one of them. PLAN B; If insurance does not allow for an in lab titration I will use an autotitration device for  30 days.   Interpreting Physician:            Sleep Summary    Oxygen Saturation Statistics     Start Study Time: End Study Time: Total Recording Time:       11:20:40 PM 9:24:41 AM 10 h, 4 min  Total Sleep Time % REM of Sleep Time:  7 h, 47 min 12.6    Mean: 91 Minimum: 56 Maximum: 98  Mean of Desaturations Nadirs (%):   85  Oxygen Desaturation. %:   4-9 10-20 >20 Total  Events Number Total   262  194 52.5 38.9  43 8.6  499 100.0  Oxygen Saturation: <90 <=88 <85 <80 <70  Duration (minutes): Sleep % 90.3 19.3 76.6 42.2 16.4 9.0 28.1 6.0 13.3 2.8     Respiratory Indices      Total Events REM NREM All Night  pRDI:  547  pAHI:  542 ODI:  499  pAHIc:  79  % CSR: 10.2 71.2 71.2 76.5 9.6 72.1 71.4 64.1 10.5 72.0 71.3 65.7 10.4       Pulse Rate Statistics during Sleep (BPM)      Mean:  65 Minimum: N/A Maximum: 114    Indices are calculated using technically valid sleep time of 7 h, 35 min. pRDI/pAHI are calculated using oxi desaturations ? 3%  Body Position Statistics  Position Supine Prone Right Left Non-Supine  Sleep (min) 438.0 0.0 9.0 19.5 28.5  Sleep % 93.7 0.0 1.9 4.2 6.1  pRDI 72.9 N/A N/A 47.2 61.1  pAHI 72.3 N/A N/A 44.1 58.9  ODI 67.0 N/A N/A 28.3 48.0     Snoring Statistics Snoring Level (dB) >40 >50 >60 >70 >80 >Threshold (45)  Sleep (min) 288.9 45.5 3.7 0.0 0.0 138.3  Sleep % 61.8 9.7 0.8 0.0 0.0 29.6    Mean: 44 dB

## 2020-02-10 NOTE — Addendum Note (Signed)
Addended by: Larey Seat on: 02/10/2020 05:00 PM   Modules accepted: Orders

## 2020-02-10 NOTE — Progress Notes (Signed)
Summary & Diagnosis:   Very severe obstructive sleep apnea with an AHI of 71.3/h(!) and  associated with prolonged, severe desaturation of oxygen. Over  100 minutes of hypoxemia were recorded. This HST estimated 10 %  central apneas.   Recommendations:    I much prefer an attended titration in the sleep lab- this  patient has used CPAP for years but has new symptoms and  comorbidities. The hypoxemia is one of them. She may be prone to treatment emerging central apneas  now.  PLAN B; If insurance does not allow for an in lab titration I  will use an autotitration device for 30 days.   Interpreting Physician: Jeb Levering, MD

## 2020-02-11 ENCOUNTER — Telehealth: Payer: Self-pay | Admitting: Neurology

## 2020-02-11 NOTE — Telephone Encounter (Signed)
-----   Message from Larey Seat, MD sent at 02/10/2020  5:00 PM EDT ----- Summary & Diagnosis:   Very severe obstructive sleep apnea with an AHI of 71.3/h(!) and  associated with prolonged, severe desaturation of oxygen. Over  100 minutes of hypoxemia were recorded. This HST estimated 10 %  central apneas.   Recommendations:    I much prefer an attended titration in the sleep lab- this  patient has used CPAP for years but has new symptoms and  comorbidities. The hypoxemia is one of them. She may be prone to treatment emerging central apneas  now.  PLAN B; If insurance does not allow for an in lab titration I  will use an autotitration device for 30 days.   Interpreting Physician: Jeb Levering, MD

## 2020-02-11 NOTE — Telephone Encounter (Signed)
Called patient to discuss sleep study results. No answer at this time. LVM for the patient to call back.   

## 2020-02-11 NOTE — Telephone Encounter (Signed)
Pt returned call and I was able to review the sleep study results with her in detail.  I advised pt that Dr. Brett Fairy reviewed their sleep study results and found that has severe sleep apnea and recommends that pt be treated with a cpap. Dr. Brett Fairy recommends that pt return for a repeat sleep study in order to properly titrate the cpap and ensure a good mask fit. Pt is agreeable to returning for a titration study. I advised pt that our sleep lab will file with pt's insurance and call pt to schedule the sleep study when we hear back from the pt's insurance regarding coverage of this sleep study. Pt verbalized understanding of results. Pt had no questions at this time but was encouraged to call back if questions arise.

## 2020-03-04 ENCOUNTER — Other Ambulatory Visit (HOSPITAL_COMMUNITY)
Admission: RE | Admit: 2020-03-04 | Discharge: 2020-03-04 | Disposition: A | Discharge status: Home / Self Care | Payer: BC Managed Care – PPO | Source: Ambulatory Visit | Attending: Neurology | Admitting: Neurology

## 2020-03-04 DIAGNOSIS — Z20822 Contact with and (suspected) exposure to covid-19: Secondary | ICD-10-CM | POA: Insufficient documentation

## 2020-03-04 DIAGNOSIS — Z01812 Encounter for preprocedural laboratory examination: Secondary | ICD-10-CM | POA: Insufficient documentation

## 2020-03-04 LAB — SARS CORONAVIRUS 2 (TAT 6-24 HRS): SARS Coronavirus 2: NEGATIVE

## 2020-03-07 ENCOUNTER — Telehealth: Payer: Self-pay | Admitting: *Deleted

## 2020-03-07 ENCOUNTER — Ambulatory Visit (INDEPENDENT_AMBULATORY_CARE_PROVIDER_SITE_OTHER): Payer: BC Managed Care – PPO | Admitting: Neurology

## 2020-03-07 DIAGNOSIS — K219 Gastro-esophageal reflux disease without esophagitis: Secondary | ICD-10-CM

## 2020-03-07 DIAGNOSIS — G4733 Obstructive sleep apnea (adult) (pediatric): Secondary | ICD-10-CM

## 2020-03-07 DIAGNOSIS — I10 Essential (primary) hypertension: Secondary | ICD-10-CM

## 2020-03-07 NOTE — Progress Notes (Signed)
FYI- Negative SARS corone Virus swab.  Cc Dr Philip Aspen.

## 2020-03-07 NOTE — Telephone Encounter (Signed)
Spoke with patient and informed her the Covid test she had prior to sleep study came back negative. Patient verbalized understanding, appreciation.

## 2020-03-15 DIAGNOSIS — G4733 Obstructive sleep apnea (adult) (pediatric): Secondary | ICD-10-CM | POA: Insufficient documentation

## 2020-03-15 DIAGNOSIS — G4734 Idiopathic sleep related nonobstructive alveolar hypoventilation: Secondary | ICD-10-CM | POA: Insufficient documentation

## 2020-03-15 NOTE — Procedures (Signed)
PATIENT'S NAME:  Lark, Torbeck DOB:      April 28, 1966      MR#:    [id number]     DATE OF RECORDING: 03/07/2020 REFERRING M.D.:  Leanna Battles, MD Study Performed:   Titration to positive airway pressure HISTORY: This 54 year - old female patient has had a Sleep study and CPAP ordered in 2015 but became non-compliant with CPAP. She needed a new baseline before CPAP could be re-prescribed for her.  She reported nocturia up to 4 times, sleep talking and night terrors.  Mrs. Plater is now returning for a CPAP Titration sleep study following a HST on 02/01/20. Patient had an AHI of 71.3/h(!). Diagnosis: Very severe obstructive sleep apnea with an AHI of 71.3/h(!) and associated with prolonged, severe desaturation of oxygen. Possible Obesity Hypoventilation.  Over 100 minutes of hypoxemia were recorded. This HST estimated 10 % central apneas. The patient was asked to return for an attended sleep study with possible oxygen titration and with face-to-face mask fitting at the lab.   The patient endorsed the Epworth Sleepiness Scale at 4/24 points.   The patient's weight 267 pounds with a height of 64 (inches), resulting in a BMI of 45.5 kg/m2. The patient's neck circumference measured 17 inches.  CURRENT MEDICATIONS: Prozac, Prilosec, Veralan PM, Ambien.   PROCEDURE:  This is a multichannel digital polysomnogram utilizing the SomnoStar 11.2 system.  Electrodes and sensors were applied and monitored per AASM Specifications.   EEG, EOG, Chin and Limb EMG, were sampled at 200 Hz.  ECG, Snore and Nasal Pressure, Thermal Airflow, Respiratory Effort, CPAP Flow and Pressure, Oximetry was sampled at 50 Hz. Digital video and audio were recorded.      CPAP was initiated at 5 cmH20 and technician fitted the patient for a FFM, Simplus model, in small size.  CPAP with heated humidity per AASM split night standards and pressure was advanced at 4.32 AM to 18 cmH20 because of hypopneas, apneas and desaturations.  At a PAP  pressure of 18 cmH20, there was a reduction of the AHI to 2.2/h with improvement of sleep apnea. The patient slept 27 minutes at this last explored pressure, oxygen nadir was now 90% Spo2.   Lights Out was at 22:08 and Lights On at 05:00. Total recording time (TRT) was 412.5 minutes, with a total sleep time (TST) of 377.5 minutes. The patient's sleep latency was 26.5 minutes. REM latency was 209 minutes.  The sleep efficiency was 91.5 %.    SLEEP ARCHITECTURE: WASO (Wake after sleep onset) was 19.5 minutes.  There were 5.5 minutes in Stage N1, 213 minutes Stage N2, 91.5 minutes Stage N3 and 67.5 minutes in Stage REM.  The percentage of Stage N1 was 1.5%, Stage N2 was 56.4%, Stage N3 was 24.2% and Stage R (REM sleep) was 17.9%. The sleep architecture was notable for REM sleep rebound and frequent PLMs at the beginning of the sleep study.   RESPIRATORY ANALYSIS:  There was a total of 160 respiratory events: 7 obstructive apneas, 13 central apneas and 0 mixed apneas with 140 hypopneas. The patient also had few respiratory event related arousals (RERAs).     The total APNEA/HYPOPNEA INDEX (AHI) was 25.4 /hour.  9 events occurred in REM sleep and 151 events in NREM. The REM AHI was 8 /hour versus a non-REM AHI of 29.2 /hour.  The patient spent 76 minutes of total sleep time in the supine position and 302 minutes in non-supine. The supine AHI was 11.8, versus  a non-supine AHI of 28.9.  OXYGEN SATURATION & C02:  The baseline 02 saturation was 94%, with the lowest being 83%. Time spent below 89% saturation equaled 34 minutes.  PERIODIC LIMB MOVEMENTS:  The patient had a total of 210 Periodic Limb Movements. The Periodic Limb Movement (PLM) Arousal index was 3.2 /hour. All PLM activity stopped after CPAP reached 12 cm water pressure.  Audio and video analysis did not show any abnormal or unusual movements, behaviors, phonations or vocalizations.  The patient took one bathroom break under 12 cm water pressure,  after which all PLMs stopped. Snoring was noted. EKG was in keeping with normal sinus rhythm (NSR). Post-study, the patient indicated that sleep was much better than expected     DIAGNOSIS Severe Obstructive Sleep Apnea was reduced to an AHI of 2.2/h in NREM sleep under 18 cm water pressure. The patient was fitted with a F&P Simplus small full-face mask. Sleep Related Hypoxemia also resolved under CPAP alone, no additional oxygen was needed.  Periodic Limb Movement Disorder resolved under CPAP.    PLANS/RECOMMENDATIONS: an autotitration capable CPAP device with heated humidity will be set for 8 through 19 cm water pressure, with 1 cm EPR. Since the patient liked the mask used during this study, she will get a prescription for a F&P Simplus small full-face mask. RV in 2-3 month with NP.   A follow up appointment will be scheduled in the Sleep Clinic at [facility].   Please call [phone] with any questions.      I certify that I have reviewed the entire raw data recording prior to the issuance of this report in accordance with the Standards of Accreditation of the American Academy of Sleep Medicine (AASM)  Larey Seat, M.D. Medical Director, [facility] [employer] [facility] (Neurology and Sleep Medicine)

## 2020-03-15 NOTE — Progress Notes (Signed)
  Severe Obstructive Sleep Apnea was reduced to an AHI of 2.2/h in NREM sleep under 18 cm water pressure. The patient was fitted with a F&P Simplus small full-face mask. Sleep Related Hypoxemia also resolved under CPAP alone, no additional oxygen was needed.  Periodic Limb Movement Disorder resolved under CPAP.    PLANS/RECOMMENDATIONS: an autotitration capable CPAP device with heated humidity will be set for 8 through 19 cm water pressure, with 1 cm EPR. Since the patient liked the mask used during this study, she will get a prescription for a F&P Simplus small full-face mask. RV in 2-3 month with NP.     Cc Dr. Philip Aspen, who is not listed in EPIC.

## 2020-03-15 NOTE — Addendum Note (Signed)
Addended by: Larey Seat on: 03/15/2020 01:00 PM   Modules accepted: Orders

## 2020-03-16 ENCOUNTER — Telehealth: Payer: Self-pay | Admitting: Neurology

## 2020-03-16 NOTE — Telephone Encounter (Signed)
-----   Message from Larey Seat, MD sent at 03/15/2020 12:59 PM EDT -----  Severe Obstructive Sleep Apnea was reduced to an AHI of 2.2/h in NREM sleep under 18 cm water pressure. The patient was fitted with a F&P Simplus small full-face mask. Sleep Related Hypoxemia also resolved under CPAP alone, no additional oxygen was needed.  Periodic Limb Movement Disorder resolved under CPAP.    PLANS/RECOMMENDATIONS: an autotitration capable CPAP device with heated humidity will be set for 8 through 19 cm water pressure, with 1 cm EPR. Since the patient liked the mask used during this study, she will get a prescription for a F&P Simplus small full-face mask. RV in 2-3 month with NP.     Cc Dr. Philip Aspen, who is not listed in EPIC.

## 2020-03-16 NOTE — Telephone Encounter (Signed)
I called pt. I advised pt that Dr. Brett Fairy reviewed their sleep study results and found that pt was able to tolerate CPAP at a pressire of 18 cm. Dr. Brett Fairy recommends that pt auto CPAP 8-19 cm water pressure. I reviewed PAP compliance expectations with the pt. Pt is agreeable to starting a CPAP. I advised pt that an order will be sent to a DME, [facility], and [facility] will call the pt within about one week after they file with the pt's insurance. [facility] will show the pt how to use the machine, fit for masks, and troubleshoot the CPAP if needed. A follow up appt was made for insurance purposes with Dr. Brett Fairy on June 21,2021 at 3:30 pm. Pt verbalized understanding to arrive 15 minutes early and bring their CPAP. A letter with all of this information in it will be mailed to the pt as a reminder. I verified with the pt that the address we have on file is correct. Pt verbalized understanding of results. Pt had no questions at this time but was encouraged to call back if questions arise. I have sent the order to [facility] and have received confirmation that they have received the order.

## 2020-03-24 NOTE — Telephone Encounter (Signed)
Called the patient back. There was no answer. LVM for the patient. I am unsure exactly what her concern was. The order was sent to [facility] initially and she is already set up with an initial CPAP visit in June.  Left a VM for  The patient to call or send a mychart message explaining if there is anything specifically needed from our office.

## 2020-03-24 NOTE — Telephone Encounter (Signed)
Phone rep checked office voicemail's,at 3:38 pt left message asking if she has been scheduled with the [facility] for her new CPAP yet, pt is asking to be called @ [phone]

## 2020-03-30 DIAGNOSIS — G4733 Obstructive sleep apnea (adult) (pediatric): Secondary | ICD-10-CM | POA: Diagnosis not present

## 2020-04-29 DIAGNOSIS — H2000 Unspecified acute and subacute iridocyclitis: Secondary | ICD-10-CM | POA: Diagnosis not present

## 2020-04-30 DIAGNOSIS — G4733 Obstructive sleep apnea (adult) (pediatric): Secondary | ICD-10-CM | POA: Diagnosis not present

## 2020-05-02 DIAGNOSIS — H2000 Unspecified acute and subacute iridocyclitis: Secondary | ICD-10-CM | POA: Diagnosis not present

## 2020-05-05 IMAGING — MG DIGITAL SCREENING BILAT W/ TOMO W/ CAD
8 series · 8 of 24 positions shown · non-contrast
Comparison: Previous exam(s).

ACR Breast Density Category a: The breast tissue is almost entirely
fatty.

CLINICAL DATA: Screening.

EXAM:
DIGITAL SCREENING BILATERAL MAMMOGRAM WITH TOMO AND CAD

[L CC synth-2D]
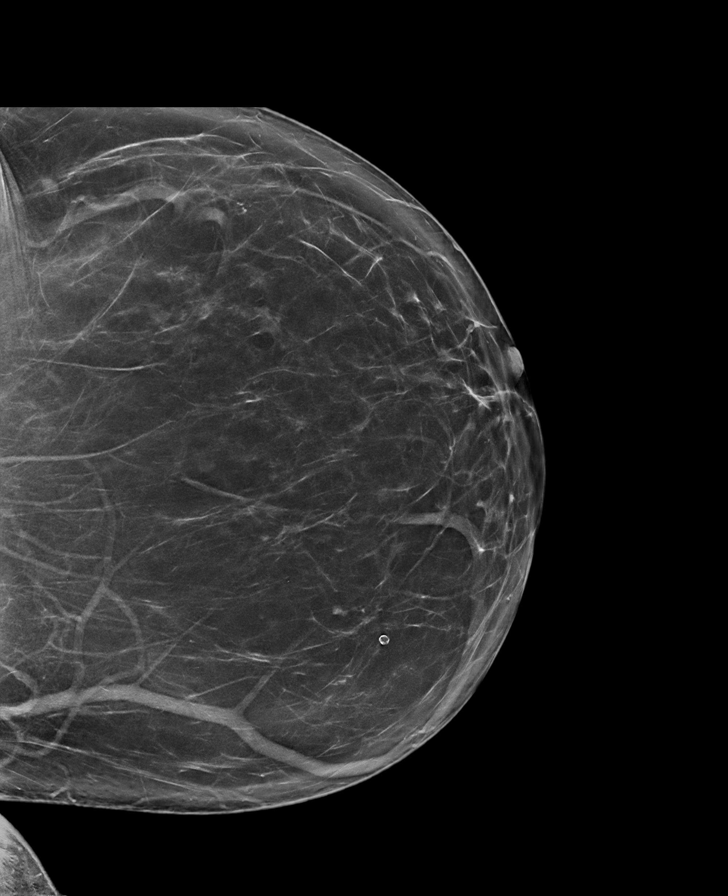

[R CC synth-2D]
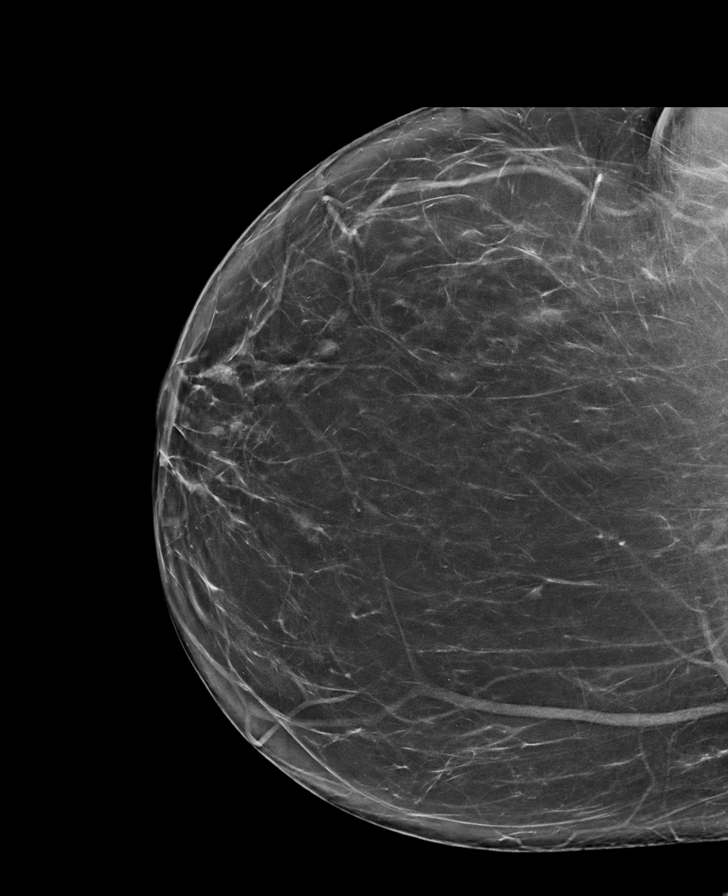

[R MLO synth-2D]
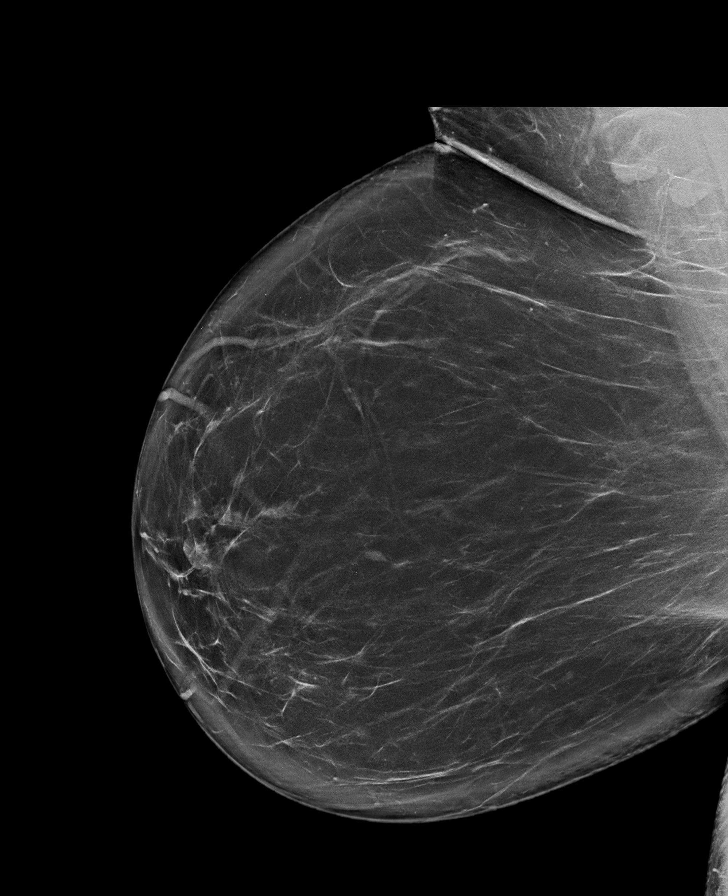

[L MLO synth-2D]
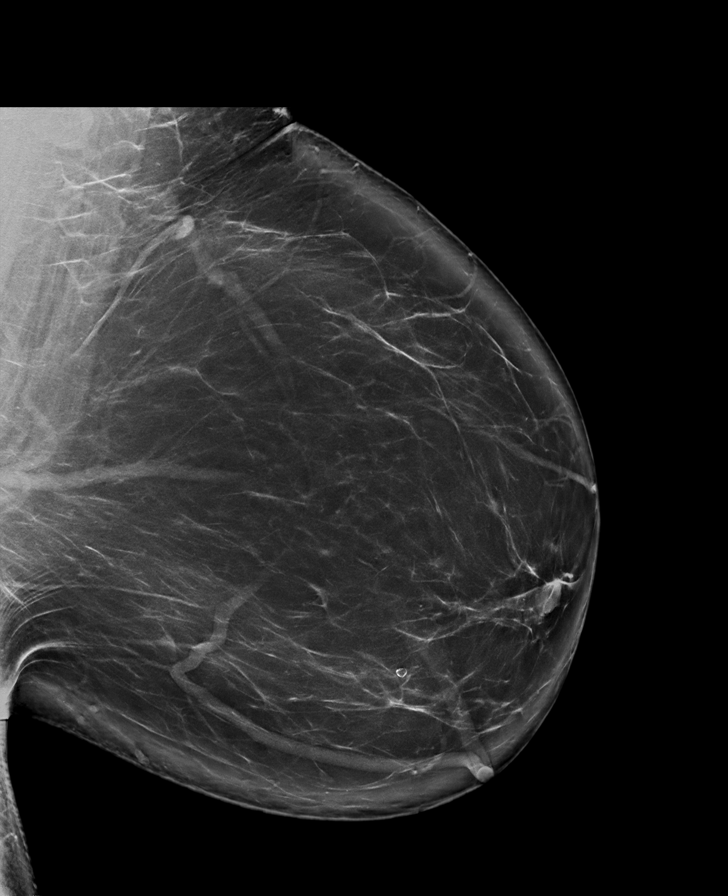

[L MLO tomo · tomo slice 55/109.0]
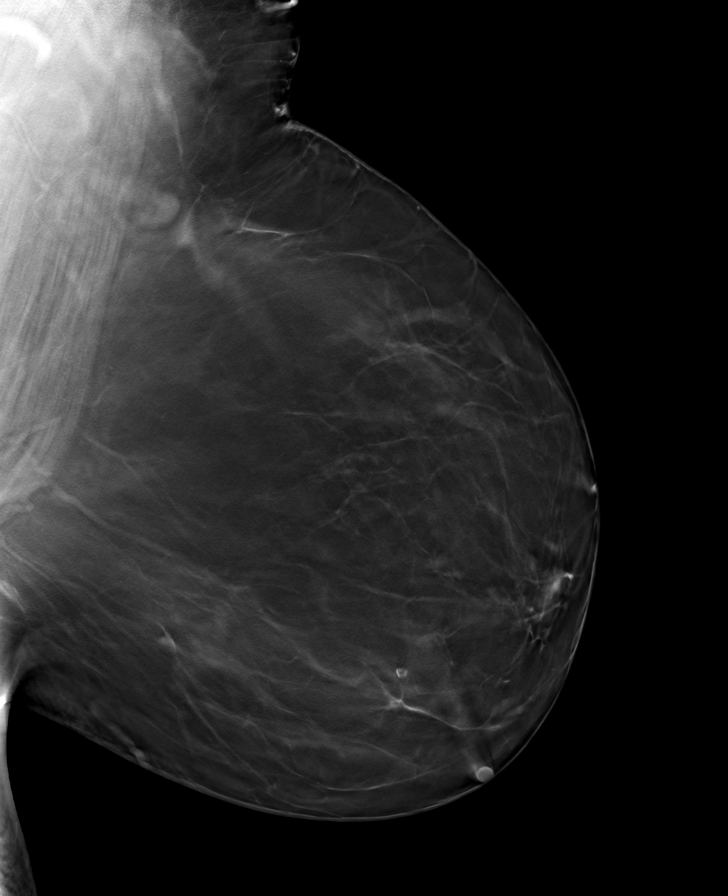

[R MLO tomo · tomo slice 53/106.0]
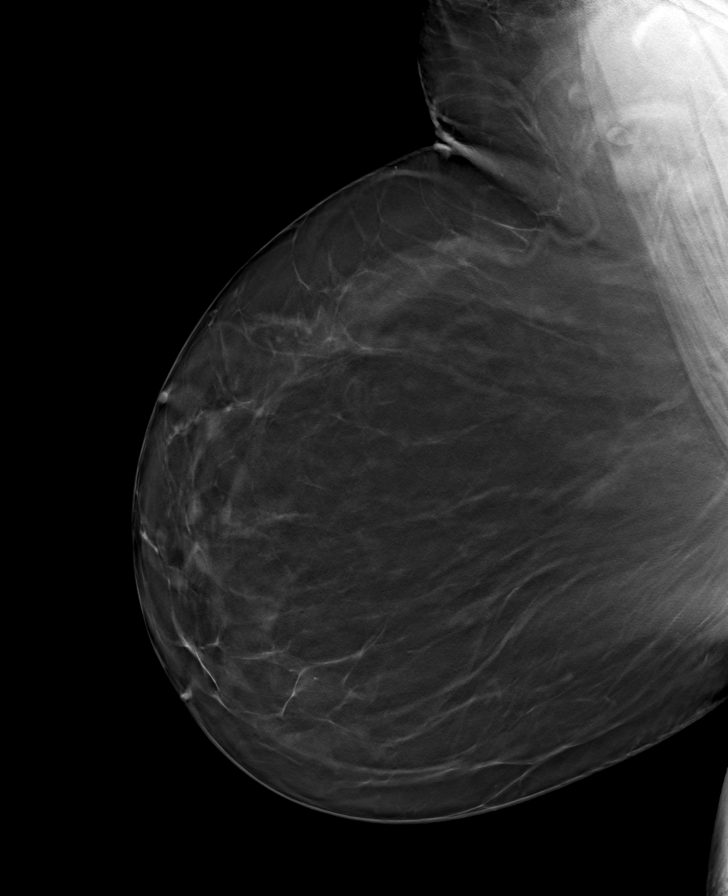

[L CC tomo · tomo slice 44/87.0]
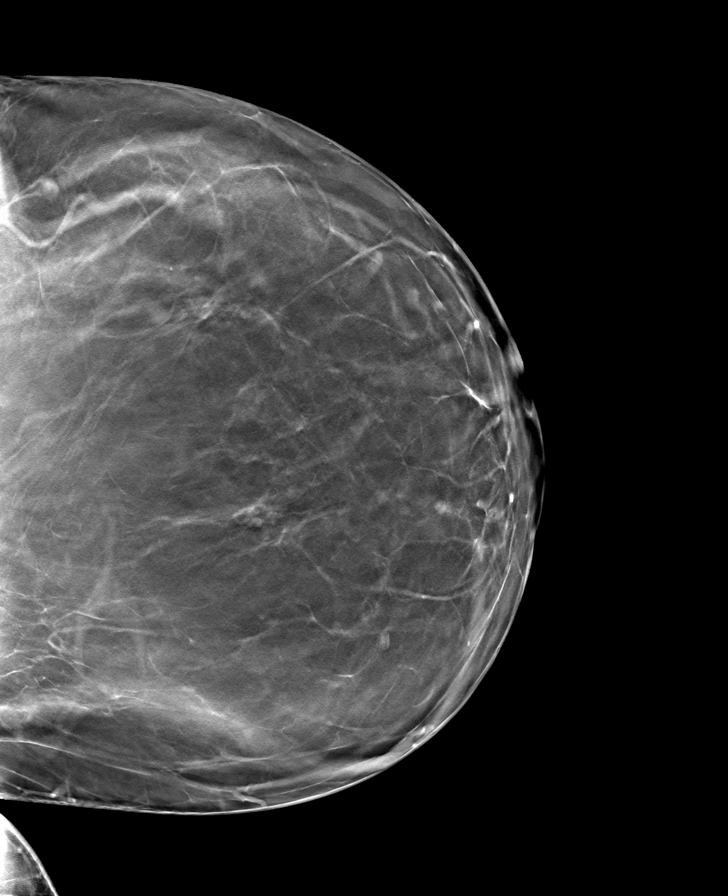

[R CC tomo · tomo slice 43/84.0]
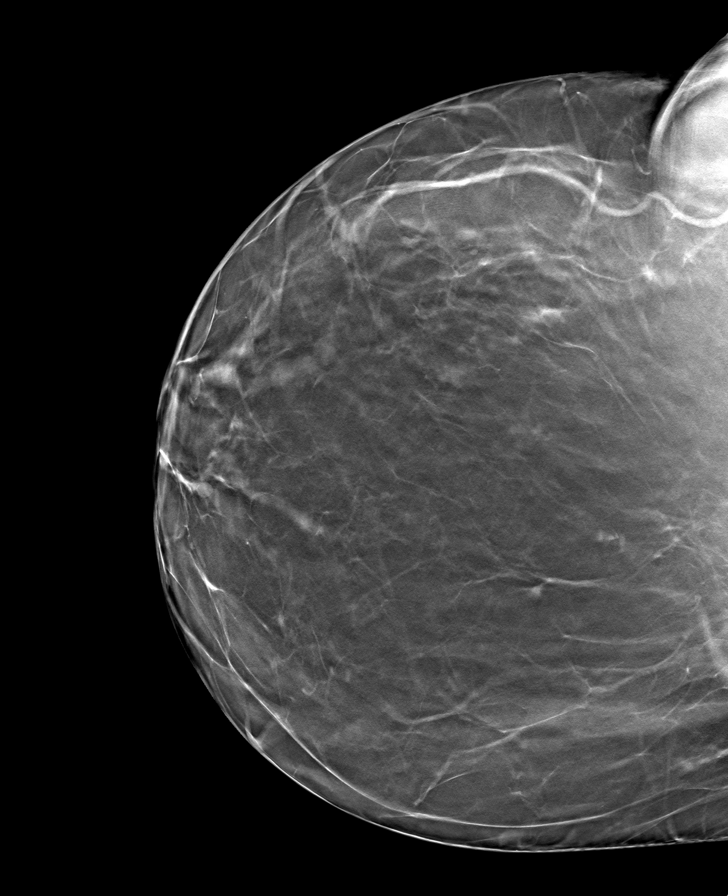

[8 of 24 positions shown; findings below may reference images not displayed]

FINDINGS: There are no findings suspicious for malignancy. Images were
processed with CAD.
IMPRESSION: No mammographic evidence of malignancy. A result letter of this
screening mammogram will be mailed directly to the patient.

RECOMMENDATION:
Screening mammogram in one year. (Code:[id number])

BI-RADS CATEGORY  1: Negative.

## 2020-05-11 ENCOUNTER — Encounter: Payer: Self-pay | Admitting: Neurology

## 2020-05-16 ENCOUNTER — Ambulatory Visit: Payer: BC Managed Care – PPO | Admitting: Neurology

## 2020-05-16 ENCOUNTER — Encounter: Payer: Self-pay | Admitting: Neurology

## 2020-05-16 ENCOUNTER — Other Ambulatory Visit: Payer: Self-pay

## 2020-05-16 VITALS — BP 142/86 | HR 73 | Ht 64.0 in | Wt 281.0 lb

## 2020-05-16 DIAGNOSIS — N951 Menopausal and female climacteric states: Secondary | ICD-10-CM

## 2020-05-16 DIAGNOSIS — Z6841 Body Mass Index (BMI) 40.0 and over, adult: Secondary | ICD-10-CM

## 2020-05-16 DIAGNOSIS — G4734 Idiopathic sleep related nonobstructive alveolar hypoventilation: Secondary | ICD-10-CM

## 2020-05-16 DIAGNOSIS — G4733 Obstructive sleep apnea (adult) (pediatric): Secondary | ICD-10-CM | POA: Diagnosis not present

## 2020-05-16 NOTE — Addendum Note (Signed)
Addended by: Larey Seat on: 05/16/2020 04:23 PM   Modules accepted: Orders

## 2020-05-16 NOTE — Patient Instructions (Signed)
Obesity Hypoventilation Syndrome  Obesity hypoventilation syndrome (OHS) means that you are not breathing well enough to get air in and out of your lungs efficiently (ventilation). This causes a low oxygen level and a high carbon dioxide level in your blood (hypoventilation). Having too much total body fat (obesity) is a significant risk factor for developing OHS. OHS makes it harder for your heart to pump oxygen-rich blood to your body. It can cause sleep disturbances and make you feel sleepy during the day. Over time, OHS can increase your risk for:  Heart disease.  High blood pressure (hypertension).  Reduced ability to absorb sugar from the bloodstream (insulin resistance).  Heart failure. Over time, OHS weakens your heart and can lead to heart failure. What are the causes? The exact cause of OHS is not known. Possible causes include:  Pressure on the lungs from excess body weight.  Obesity-related changes in how much air the lungs can hold (lung capacity) and how much they can expand (lung compliance).  Failure of the brain to regulate oxygen and carbon dioxide levels properly.  Chemicals (hormones) produced by excess fat cells interfering with breathing regulation.  A breathing condition in which breathing pauses or becomes shallow during sleep (sleep apnea). This condition can eventually cause the body to ventilate poorly and to hold onto carbon dioxide during the day. What increases the risk? You may have a greater risk for OHS if you:  Have a BMI of 30 or higher. BMI is an estimate of body fat that is calculated from height and weight. For adults, a BMI of 30 or higher is considered obese.  Are 40?55 years old.  Carry most of your excess weight around your waist.  Experience moderate symptoms of sleep apnea. What are the signs or symptoms? The most common symptoms of OHS are:  Daytime sleepiness.  Lack of energy.  Shortness of breath.  Morning headaches.  Sleep  apnea.  Trouble concentrating.  Irritability, mood swings, or depression.  Swollen veins in the neck.  Swelling of the legs. How is this diagnosed? Your health care provider may suspect OHS if you are obese and have poor breathing during the day and at night. Your health care provider will also do a physical exam. You may have tests to:  Measure your BMI.  Measure your blood oxygen level with a sensor placed on your finger (pulse oximetry).  Measure blood oxygen and carbon dioxide in a blood sample.  Measure the amount of red blood cells in a blood sample. OHS causes the number of red blood cells you have to increase (polycythemia).  Check your breathing ability (pulmonary function testing).  Check your breathing ability, breathing patterns, and oxygen level while you sleep (sleep study). You may also have a chest X-ray to rule out other breathing problems. You may have an electrocardiogram (ECG) and or echocardiogram to check for signs of heart failure. How is this treated? Weight loss is the most important part of treatment for OHS, and it may be the only treatment that you need. Other treatments may include:  Using a device to open your airway while you sleep, such as a continuous positive airway pressure (CPAP) machine that delivers oxygen to your airway through a mask.  Surgery (gastric bypass surgery) to lower your BMI. This may be needed if: ? You are very obese. ? Other treatments have not worked for you. ? Your OHS is very severe and is causing organ damage, such as heart failure. Follow these   instructions at home:  Medicines  Take over-the-counter and prescription medicines only as told by your health care provider.  Ask your health care provider what medicines are safe for you. You may be told to avoid medicines that can impair breathing and make OHS worse, such as sedatives and narcotics. Sleeping habits  If you are prescribed a CPAP machine, make sure you  understand and use the machine as directed.  Try to get 8 hours of sleep every night.  Go to bed at the same time every night, and get up at the same time every day. General instructions  Work with your health care provider to make a diet and exercise plan that helps you reach and maintain a healthy weight.  Eat a healthy diet.  Avoid smoking.  Exercise regularly as told by your health care provider.  During the evening, do not drink caffeine and do not eat heavy meals.  Keep all follow-up visits as told by your health care provider. This is important. Contact a health care provider if:  You experience new or worsening shortness of breath.  You have chest pain.  You have an irregular heartbeat (palpitations).  You have dizziness.  You faint.  You develop a cough.  You have a fever.  You have chest pain when you breathe (pleurisy). This information is not intended to replace advice given to you by your health care provider. Make sure you discuss any questions you have with your health care provider. Document Revised: 03/06/2019 Document Reviewed: 04/23/2016 Elsevier Patient Education  2020 [employer]  

## 2020-05-16 NOTE — Progress Notes (Signed)
SLEEP MEDICINE CLINIC    Provider:  Larey Seat, MD  Primary Care Physician:  Leanna Battles, MD [place] [address]     Referring Provider: Leanna Battles, [name],  [place] [address]          Chief Complaint according to patient   Patient presents with:    . New Patient (Initial Visit)     pt has a machine that she uses. She had her last SS completed in 2015 through here. she has been unable to use the machine due to not having a good mask. interested in restarting therapy. unsure where she was set up with DME      HISTORY OF PRESENT ILLNESS:  Alexis Wall is a 54 y.o. Caucasian female patient seen here for a RV on 05/16/2020 :  I had the pleasure of seeing Alexis Wall today after she had undergone a sleep test her first form was a home sleep test performed on 01 February 2020 and she was diagnosed with an AHI of 71.3 this was severe sleep apnea associated with prolonged oxygen desaturations she had over 100 minutes of low oxygen and that sleep test.  I would have much preferred an attended titration but her insurance did not permitted.  She was therefore was finally allowed to return on 07 March 2020 and her severe sleep apnea was reduced an AHI of 2.2/h under 18 cm water pressure she was fitted with a small sized fullface mask and her hypoxemia resolved with CPAP there was no additional oxygen needed.  It was also interested that the periodic limb movements and twitches that were initially seen also no longer present.  She was placed on Ultracet titration 8-19 cmH2O with 1 cm expiratory pressure relief she has been able to use it for 23 out of the last 30 days but it is difficult for her to reach 4 hours of consecutive use.  While using CPAP her AHI has been 2.3 much reduced.  The residual apneas are still obstructive and not central.  She does have a high ER air leakage 45 L/min this may be part of the problem to be compliant with more hours.   Also CPAP at this high pressure is not necessary comfortable.  She reports that she has no aerophagia. Overall her experience with CPAP however is positive. She has not reported discomfort with these high pressure.  Should this change, we need to switch to BiPAP. Air leaks can be addressed by DME- we had originally prescribed a different mask.     Consultation :   Chief concern according to patient :  She needs supplies and possibly a new CPAP  machine  Her last visit with [place] was for a SPLIT night PSG  on February 26, 2014.  The patient was 54 years old at the time, had a known diagnosis of apnea but broke some days still with headaches, insomnia was a bit of a concern and fatigue.  Epworth Sleepiness Scale was endorsed at 7 out of 24 points at the time her BMI was 43.  Neck circumference was 17, in her baseline study she reached an AHI of 60.5/h and all her events were non-REM sleep related.  She also retain CO2 to a maximum of 4312-hour.  Supine AHI was surprisingly lower than nonsupine AHI.  Lowest oxygen at nadir was 87% there were 10.8 minutes of desaturation time, she was titrated to the same night to 8  cmH2O and used a nasal pillow ever since.  She is continue to use her CPAP since.  And I am able to look at a compliance download his data encompassing 90 days from October 2020 through the mid January of this year.    Apparently she could only use the CPAP 1 day because she has no longer working interface.  She has been set up with nasal pillows but these are brittle and she needs a new order, would like a fitting for a different type of interface.   She is also due for a new machine.  Since her machine is still working as it seems she cannot keep the machine for travel or for second home.  She reports the machine is working, the mask is not.    She has seen Dr. Sharlett Iles the last time on 17 March 2019 in a telephone medicine visit, she left a telephone note to be referred.     I have the  pleasure of seeing Alexis Wall today, a right -handed White or Caucasian female with known OSA, previously severe degree of apnea as dx in 2015- at [facility]. She  has a past medical history of Anemia, Arthritis, Bipolar disorder ([place]), Depression, Diabetes mellitus, GERD (gastroesophageal reflux disease), Hypertension, Morbid obesity ([place]), Polyp of colon, and Sleep apnea. Patient is currently on Zestoretic, she takes Ambien as needed, omeprazole, fluoxetine, verapamil.  Past medical history is positive for osteoarthritis of the knees, morbid obesity, she does take hypertension medicine.  She has an Omron digital monitor at home.  She also has GERD. No longer reporting headaches after she started with CPAP. Patient had Dx of only mild OSA when tested first time in 2009 .  The patient underwent Cholecystectomy with Dr Adonis Housekeeper in October 2020 I reviewed her OR notes and surgical history and Dr. Roderic Scarce anaesthesia notes. She was not using her CPAP regularly at the time, according to our wi-fi accessed data.     Sleep relevant medical history: Nocturia: 2-4  Sleep talking , Night terrors,    Family medical /sleep history: father passed away in 2023/06/11 , he had OSA as did her paternal uncle.    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 Family status is married with 2 children.  Pets are present. Tobacco use- quit 1980 .  ETOH use socially,  Caffeine intake in form of Coffee( 2 am  ) Soda( trying to quit , 1 coke a day ) Tea ( in pm, decaffeinated. ). She does not consume  energy drinks.      Sleep habits are as follows: The patient's dinner time is between 5.30 PM. The patient goes to bed at 10 PM and reads, asleep by 11.30 , she continues to sleep for 2-3 hours, wakes for her many bathroom breaks.   The preferred sleep position is  side ways, with the support of 2 pillows.  Dreams are reportedly rare- (" has not always been this way") 7.30 AM is the usual rise time. The patient wakes up spontaneously.  She reports some days not feeling refreshed or restored in AM, waking up with symptoms such as dry mouth, sometimes with  morning headaches, and often with residual fatigue.  Naps are taken on weekends frequently, lasting from 15- 30 minutes and are more  refreshing than nocturnal sleep.    Review of Systems: Out of a complete 14 system review, the patient complains of only the following symptoms, and all other reviewed systems are negative.:  Fatigue, some sleepiness ,fragmented sleep, nocturia, snoring. No RLS, some morning headaches, dry mouth.     How likely are you to doze in the following situations: 0 = not likely, 1 = slight chance, 2 = moderate chance, 3 = high chance   Sitting and Reading? Watching Television? Sitting inactive in a public place (theater or meeting)? As a passenger in a car for an hour without a break? Lying down in the afternoon when circumstances permit? Sitting and talking to someone? Sitting quietly after lunch without alcohol? In a car, while stopped for a few minutes in traffic?   Total = 4 / 24 points   FSS endorsed at 35 / 63 points.   Depression - 3 / 15 points .    Social History   Socioeconomic History  . Marital status: Married    Spouse name: Not on file  . Number of children: Not on file  . Years of education: Not on file  . Highest education level: Not on file  Occupational History  . Not on file  Tobacco Use  . Smoking status: Former Smoker    Quit date: 05/03/1979    Years since quitting: 41.0  . Smokeless tobacco: Never Used  Vaping Use  . Vaping Use: Never used  Substance and Sexual Activity  . Alcohol use: Yes    Alcohol/week: 1.0 standard drink    Types: 1 Glasses of wine per week    Comment: occasional glass of wine once per week  . Drug use: No  .  Sexual activity: Yes    Comment: TAH  Other Topics Concern  . Not on file  Social History Narrative  . Not on file   Social Determinants of Health   Financial Resource Strain:   . Difficulty of Paying Living Expenses:   Food Insecurity:   . Worried About Charity fundraiser in the Last Year:   . Arboriculturist in the Last Year:   Transportation Needs:   . Film/video editor (Medical):   Marland Kitchen Lack of Transportation (Non-Medical):   Physical Activity:   . Days of Exercise per Week:   . Minutes of Exercise per Session:   Stress:   . Feeling of Stress :   Social Connections:   . Frequency of Communication with Friends and Family:   . Frequency of Social Gatherings with Friends and Family:   . Attends Religious Services:   . Active Member of Clubs or Organizations:   . Attends Archivist Meetings:   Marland Kitchen Marital Status:     Family History  Problem Relation Age of Onset  . Hypertension Father   . Obesity Father   . Cancer Maternal Grandfather        colon  . Sleep apnea Mother   . Heart disease Paternal Grandfather   . Colon cancer Neg Hx     Past Medical History:  Diagnosis Date  . Anemia   . Arthritis    pains in knee  . Bipolar disorder ([place])    per Dr D.Patterson's office note 07/08/2007  . Depression    [facility]  . Diabetes mellitus    GESTATIONAL-not now  . GERD (gastroesophageal reflux disease)   . Hypertension   . Morbid obesity ([place])   . Polyp  of colon   . Sleep apnea    Wears CPAP    Past Surgical History:  Procedure Laterality Date  . ABDOMINAL HYSTERECTOMY  03/21/2010   TAH  . BREATH TEK H PYLORI  08/05/2012   Procedure: BREATH TEK H PYLORI;  Surgeon: Pedro Earls, MD;  Location: [facility];  Service: General;  Laterality: N/A;  745  . CESAREAN SECTION  01/26/1999, 09/30/2002   X2.Marland Kitchen WITH BTSP  . CHOLECYSTECTOMY N/A 08/27/2018   Procedure: LAPAROSCOPIC CHOLECYSTECTOMY WITH INTRAOPERATIVE CHOLANGIOGRAM;  Surgeon: Excell Seltzer, MD;  Location: [facility];  Service: General;  Laterality: N/A;  . ENDOMETRIAL ABLATION  08/25/2009   HER OPTION      Current Outpatient Medications on File Prior to Visit  Medication Sig Dispense Refill  . conjugated estrogens (PREMARIN) vaginal cream 1 applicator .625 mg every night for 7 nights and then 3 times a week 42.5 g 12  . omeprazole (PRILOSEC) 20 MG capsule Take 20 mg by mouth at bedtime.     . verapamil (VERELAN PM) 180 MG 24 hr capsule Take 180 mg by mouth at bedtime.  11  . zolpidem (AMBIEN) 10 MG tablet Take 10 mg by mouth at bedtime.      No current facility-administered medications on file prior to visit.    Allergies  Allergen Reactions  . Penicillins Rash    Happened in childhood, does not remember if it spread all over her body or not. Has patient had a PCN reaction causing immediate rash, facial/tongue/throat swelling, SOB or lightheadedness with hypotension: no Has patient had a PCN reaction causing severe rash involving mucus membranes or skin necrosis: no Has patient had a PCN reaction that required hospitalization no Has patient had a PCN reaction occurring within the last 10 years: no If all of the above answers are "NO", then may proceed with C    Physical exam:  Today's Vitals   05/16/20 1537  BP: (!) 142/86  Pulse: 73  Weight: 281 lb (127.5 kg)  Height: 5\' 4"  (1.626 m)   Body mass index is 48.23 kg/m.   Wt Readings from Last 3 Encounters:  05/16/20 281 lb (127.5 kg)  02/09/20 272 lb (123.4 kg)  01/07/20 267 lb (121.1 kg)     Ht Readings from Last 3 Encounters:  05/16/20 5\' 4"  (1.626 m)  02/09/20 5\' 4"  (1.626 m)  01/07/20 5\' 4"  (1.626 m)      General: The patient is awake, alert and appears not in acute distress. The patient is well groomed. Head: Normocephalic, atraumatic. Neck is supple. Mallampati 3,  neck circumference: 18 inches.  Nasal airflow is patent. Retrognathia is not seen.  Dental status: intact - no bruxism marks.   Cardiovascular:  Regular rate and cardiac rhythm by pulse, without distended neck veins. Respiratory: Lungs are clear .  Skin:  Without evidence of mild ankle edema or rash. Trunk: The patient's posture is erect.   Neurologic exam : The patient is awake and alert, oriented to place and time.   Memory subjective described as intact.  Attention span & concentration ability appears normal.  Speech is fluent,  without  dysarthria, dysphonia or aphasia.  Mood and affect are appropriate, pleasant and cooperative.   Cranial nerves: no loss of smell or taste reported.  Pupils are equal and briskly reactive to light. Funduscopic exam deferred.  Extraocular movements in vertical and horizontal planes were intact and without nystagmus. No Diplopia. Visual fields by finger perimetry are intact. Hearing was  intact to soft voice and finger rubbing. acial sensation intact to fine touch. Facial motor strength is symmetric and tongue and uvula move midline.  Neck ROM : rotation, tilt and flexion extension were normal for age and shoulder shrug was symmetrical.    Motor exam:  Symmetric bulk, tone and ROM.   Normal tone without cog- wheeling, and providing symmetric grip strength . Sensory:  Fine touch, pinprick and vibration were normal.  Proprioception tested in the upper extremities was normal.  Coordination: no changes in penmanship.Rapid alternating movements in the fingers/hands were of normal speed.  The Finger-to-nose maneuver was intact without evidence of ataxia, dysmetria or tremor. No changes in penmanship. Gait and station: Patient could rise unassisted from a seated position, walked without assistive device.  Toe and heel walk were deferred.  Deep tendon reflexes: in the upper and lower extremities are symmetric and intact.  Babinski response was deferred .      After spending a total time of 20 minutes face to face and additional time for physical and neurologic examination, review of  laboratory studies, recent surgical history ( anesthesia mentioned OSA diagnosis but was not aware of poor compliance at the time of surgery). personal review of reports and results of other testing and review of referral information / records as far as provided in visit, I have established the following assessments:  1) I had the pleasure of having seen Alexis Wall almost 6 years ago before her first sleep study in our lab.  At the time her BMI was morbid obesity grade 3- similar to today's she actually increased slightly from 43-46.  I ordered a new functioning CPAP with a  new interface for her and she has done well - she needs to improve compliance further , though- needs o call [facility].      My Plan is to proceed with: Rv I 12 month .    I would like to thank Leanna Battles, MD , [address] [place],  [place] [address] for allowing me to meet with and to take care of this pleasant patient.   In short, Alexis Wall is presenting with known OSA and owns a CPAP- but cannot use it because the mask doesn't fit her anymore, and CPAP compliance was poor over the last 90 days, not allowing for an automatic DME order of supplies.   I plan to follow up either personally or through our NP within 2-3  month.   CC: I will share my notes with Dr. Philip Aspen  and with Elon Alas ,NP .   Electronically signed by: Larey Seat, MD 05/16/2020 4:00 PM  [facility] Board certified by The [employer] of Sleep Medicine and Diplomate of the [employer] of Sleep Medicine. Board certified In Neurology through the [place], Fellow of the [employer] of Neurology. Medical Director of [employer].

## 2020-05-19 DIAGNOSIS — H2000 Unspecified acute and subacute iridocyclitis: Secondary | ICD-10-CM | POA: Diagnosis not present

## 2020-05-20 DIAGNOSIS — M25561 Pain in right knee: Secondary | ICD-10-CM | POA: Diagnosis not present

## 2020-05-20 DIAGNOSIS — M25562 Pain in left knee: Secondary | ICD-10-CM | POA: Diagnosis not present

## 2020-05-30 DIAGNOSIS — G4733 Obstructive sleep apnea (adult) (pediatric): Secondary | ICD-10-CM | POA: Diagnosis not present

## 2020-06-02 DIAGNOSIS — M25561 Pain in right knee: Secondary | ICD-10-CM | POA: Diagnosis not present

## 2020-06-24 DIAGNOSIS — S83241A Other tear of medial meniscus, current injury, right knee, initial encounter: Secondary | ICD-10-CM | POA: Diagnosis not present

## 2020-06-24 DIAGNOSIS — M25561 Pain in right knee: Secondary | ICD-10-CM | POA: Diagnosis not present

## 2020-06-30 DIAGNOSIS — G4733 Obstructive sleep apnea (adult) (pediatric): Secondary | ICD-10-CM | POA: Diagnosis not present

## 2020-07-23 DIAGNOSIS — G4733 Obstructive sleep apnea (adult) (pediatric): Secondary | ICD-10-CM | POA: Diagnosis not present

## 2020-07-26 DIAGNOSIS — M94261 Chondromalacia, right knee: Secondary | ICD-10-CM | POA: Diagnosis not present

## 2020-07-26 DIAGNOSIS — M23321 Other meniscus derangements, posterior horn of medial meniscus, right knee: Secondary | ICD-10-CM | POA: Diagnosis not present

## 2020-07-26 DIAGNOSIS — M67261 Synovial hypertrophy, not elsewhere classified, right lower leg: Secondary | ICD-10-CM | POA: Diagnosis not present

## 2020-07-26 DIAGNOSIS — G8918 Other acute postprocedural pain: Secondary | ICD-10-CM | POA: Diagnosis not present

## 2020-09-05 DIAGNOSIS — M25561 Pain in right knee: Secondary | ICD-10-CM | POA: Diagnosis not present

## 2020-09-13 DIAGNOSIS — M25561 Pain in right knee: Secondary | ICD-10-CM | POA: Diagnosis not present

## 2020-09-16 DIAGNOSIS — M25561 Pain in right knee: Secondary | ICD-10-CM | POA: Diagnosis not present

## 2020-09-19 DIAGNOSIS — M25561 Pain in right knee: Secondary | ICD-10-CM | POA: Diagnosis not present

## 2020-10-11 DIAGNOSIS — I10 Essential (primary) hypertension: Secondary | ICD-10-CM | POA: Diagnosis not present

## 2020-10-11 DIAGNOSIS — R059 Cough, unspecified: Secondary | ICD-10-CM | POA: Diagnosis not present

## 2020-10-11 DIAGNOSIS — R509 Fever, unspecified: Secondary | ICD-10-CM | POA: Diagnosis not present

## 2020-12-30 DIAGNOSIS — Z0000 Encounter for general adult medical examination without abnormal findings: Secondary | ICD-10-CM | POA: Diagnosis not present

## 2020-12-30 DIAGNOSIS — E785 Hyperlipidemia, unspecified: Secondary | ICD-10-CM | POA: Diagnosis not present

## 2020-12-30 DIAGNOSIS — R7301 Impaired fasting glucose: Secondary | ICD-10-CM | POA: Diagnosis not present

## 2021-01-06 DIAGNOSIS — I10 Essential (primary) hypertension: Secondary | ICD-10-CM | POA: Diagnosis not present

## 2021-01-06 DIAGNOSIS — R0609 Other forms of dyspnea: Secondary | ICD-10-CM | POA: Diagnosis not present

## 2021-01-06 DIAGNOSIS — R82998 Other abnormal findings in urine: Secondary | ICD-10-CM | POA: Diagnosis not present

## 2021-01-06 DIAGNOSIS — Z0000 Encounter for general adult medical examination without abnormal findings: Secondary | ICD-10-CM | POA: Diagnosis not present

## 2021-01-06 DIAGNOSIS — R7301 Impaired fasting glucose: Secondary | ICD-10-CM | POA: Diagnosis not present

## 2021-01-06 DIAGNOSIS — Z1212 Encounter for screening for malignant neoplasm of rectum: Secondary | ICD-10-CM | POA: Diagnosis not present

## 2021-02-09 ENCOUNTER — Encounter: Payer: BC Managed Care – PPO | Admitting: Nurse Practitioner

## 2021-02-11 ENCOUNTER — Encounter (HOSPITAL_COMMUNITY): Payer: Self-pay | Admitting: Internal Medicine

## 2021-02-11 ENCOUNTER — Observation Stay (HOSPITAL_BASED_OUTPATIENT_CLINIC_OR_DEPARTMENT_OTHER)
Admission: EM | Admit: 2021-02-11 | Discharge: 2021-02-12 | Disposition: A | Discharge status: Home / Self Care | Payer: BC Managed Care – PPO | Attending: Internal Medicine | Admitting: Internal Medicine

## 2021-02-11 ENCOUNTER — Emergency Department (HOSPITAL_BASED_OUTPATIENT_CLINIC_OR_DEPARTMENT_OTHER): Payer: BC Managed Care – PPO

## 2021-02-11 ENCOUNTER — Other Ambulatory Visit: Payer: Self-pay

## 2021-02-11 DIAGNOSIS — I10 Essential (primary) hypertension: Secondary | ICD-10-CM | POA: Insufficient documentation

## 2021-02-11 DIAGNOSIS — Z20822 Contact with and (suspected) exposure to covid-19: Secondary | ICD-10-CM | POA: Diagnosis not present

## 2021-02-11 DIAGNOSIS — Z79899 Other long term (current) drug therapy: Secondary | ICD-10-CM | POA: Insufficient documentation

## 2021-02-11 DIAGNOSIS — R079 Chest pain, unspecified: Principal | ICD-10-CM | POA: Diagnosis present

## 2021-02-11 DIAGNOSIS — F319 Bipolar disorder, unspecified: Secondary | ICD-10-CM | POA: Diagnosis present

## 2021-02-11 DIAGNOSIS — Z87891 Personal history of nicotine dependence: Secondary | ICD-10-CM | POA: Insufficient documentation

## 2021-02-11 DIAGNOSIS — G4734 Idiopathic sleep related nonobstructive alveolar hypoventilation: Secondary | ICD-10-CM | POA: Diagnosis present

## 2021-02-11 DIAGNOSIS — E871 Hypo-osmolality and hyponatremia: Secondary | ICD-10-CM | POA: Diagnosis not present

## 2021-02-11 DIAGNOSIS — E119 Type 2 diabetes mellitus without complications: Secondary | ICD-10-CM | POA: Insufficient documentation

## 2021-02-11 DIAGNOSIS — Z86010 Personal history of colonic polyps: Secondary | ICD-10-CM | POA: Insufficient documentation

## 2021-02-11 DIAGNOSIS — R072 Precordial pain: Secondary | ICD-10-CM | POA: Diagnosis not present

## 2021-02-11 DIAGNOSIS — G4733 Obstructive sleep apnea (adult) (pediatric): Secondary | ICD-10-CM | POA: Diagnosis present

## 2021-02-11 LAB — CBC
HCT: 45 % (ref 36.0–46.0)
Hemoglobin: 15.1 g/dL — ABNORMAL HIGH (ref 12.0–15.0)
MCH: 28 pg (ref 26.0–34.0)
MCHC: 33.6 g/dL (ref 30.0–36.0)
MCV: 83.3 fL (ref 80.0–100.0)
Platelets: 369 10*3/uL (ref 150–400)
RBC: 5.4 MIL/uL — ABNORMAL HIGH (ref 3.87–5.11)
RDW: 13.5 % (ref 11.5–15.5)
WBC: 13.9 10*3/uL — ABNORMAL HIGH (ref 4.0–10.5)
nRBC: 0 % (ref 0.0–0.2)

## 2021-02-11 LAB — COMPREHENSIVE METABOLIC PANEL
ALT: 39 U/L (ref 0–44)
AST: 39 U/L (ref 15–41)
Albumin: 4.4 g/dL (ref 3.5–5.0)
Alkaline Phosphatase: 106 U/L (ref 38–126)
Anion gap: 13 (ref 5–15)
BUN: 12 mg/dL (ref 6–20)
CO2: 24 mmol/L (ref 22–32)
Calcium: 11.2 mg/dL — ABNORMAL HIGH (ref 8.9–10.3)
Chloride: 96 mmol/L — ABNORMAL LOW (ref 98–111)
Creatinine, Ser: 0.84 mg/dL (ref 0.44–1.00)
GFR, Estimated: >60 mL/min (ref >60)
Glucose, Bld: 167 mg/dL — ABNORMAL HIGH (ref 70–99)
Potassium: 3.3 mmol/L — ABNORMAL LOW (ref 3.5–5.1)
Sodium: 133 mmol/L — ABNORMAL LOW (ref 135–145)
Total Bilirubin: 1 mg/dL (ref 0.3–1.2)
Total Protein: 7.6 g/dL (ref 6.5–8.1)

## 2021-02-11 LAB — URINALYSIS, ROUTINE W REFLEX MICROSCOPIC
Bilirubin Urine: NEGATIVE
Glucose, UA: NEGATIVE mg/dL
Hgb urine dipstick: NEGATIVE
Ketones, ur: NEGATIVE mg/dL
Nitrite: NEGATIVE
Protein, ur: NEGATIVE mg/dL
Specific Gravity, Urine: 1.003 — ABNORMAL LOW (ref 1.005–1.030)
pH: 6 (ref 5.0–8.0)

## 2021-02-11 LAB — CBC WITH DIFFERENTIAL/PLATELET
Abs Immature Granulocytes: 0.04 10*3/uL (ref 0.00–0.07)
Basophils Absolute: 0.1 10*3/uL (ref 0.0–0.1)
Basophils Relative: 1 %
Eosinophils Absolute: 0.2 10*3/uL (ref 0.0–0.5)
Eosinophils Relative: 2 %
HCT: 45.1 % (ref 36.0–46.0)
Hemoglobin: 14.7 g/dL (ref 12.0–15.0)
Immature Granulocytes: 0 %
Lymphocytes Relative: 18 %
Lymphs Abs: 2.2 10*3/uL (ref 0.7–4.0)
MCH: 27.4 pg (ref 26.0–34.0)
MCHC: 32.6 g/dL (ref 30.0–36.0)
MCV: 84.1 fL (ref 80.0–100.0)
Monocytes Absolute: 1.2 10*3/uL — ABNORMAL HIGH (ref 0.1–1.0)
Monocytes Relative: 10 %
Neutro Abs: 8.6 10*3/uL — ABNORMAL HIGH (ref 1.7–7.7)
Neutrophils Relative %: 69 %
Platelets: 373 10*3/uL (ref 150–400)
RBC: 5.36 MIL/uL — ABNORMAL HIGH (ref 3.87–5.11)
RDW: 13.8 % (ref 11.5–15.5)
WBC: 12.4 10*3/uL — ABNORMAL HIGH (ref 4.0–10.5)
nRBC: 0 % (ref 0.0–0.2)

## 2021-02-11 LAB — TROPONIN I (HIGH SENSITIVITY)
Troponin I (High Sensitivity): 3 ng/L (ref <18)
Troponin I (High Sensitivity): 4 ng/L (ref <18)

## 2021-02-11 LAB — RESP PANEL BY RT-PCR (FLU A&B, COVID) ARPGX2
Influenza A by PCR: NEGATIVE
Influenza B by PCR: NEGATIVE
SARS Coronavirus 2 by RT PCR: NEGATIVE

## 2021-02-11 LAB — CREATININE, SERUM
Creatinine, Ser: 0.81 mg/dL (ref 0.44–1.00)
GFR, Estimated: >60 mL/min (ref >60)

## 2021-02-11 LAB — D-DIMER, QUANTITATIVE: D-Dimer, Quant: 0.5 ug/mL-FEU (ref 0.00–0.50)

## 2021-02-11 LAB — HIV ANTIBODY (ROUTINE TESTING W REFLEX): HIV Screen 4th Generation wRfx: NONREACTIVE

## 2021-02-11 MED ORDER — ACETAMINOPHEN 325 MG PO TABS
650.0000 mg | ORAL_TABLET | ORAL | Status: DC | PRN
  Administered 2021-02-11: 650 mg via ORAL
  Filled 2021-02-11: qty 2

## 2021-02-11 MED ORDER — SODIUM CHLORIDE 0.9 % IV SOLN: INTRAVENOUS | Status: DC

## 2021-02-11 MED ORDER — NITROGLYCERIN 0.4 MG SL SUBL: 0.4000 mg | SUBLINGUAL_TABLET | SUBLINGUAL | Status: DC | PRN

## 2021-02-11 MED ORDER — POTASSIUM CHLORIDE IN NACL 20-0.9 MEQ/L-% IV SOLN
INTRAVENOUS | Status: DC
  Filled 2021-02-11 (×3): qty 1000

## 2021-02-11 MED ORDER — HYDROXYZINE HCL 25 MG PO TABS
50.0000 mg | ORAL_TABLET | Freq: Once | ORAL | Status: AC
  Administered 2021-02-11: 50 mg via ORAL
  Filled 2021-02-11: qty 2

## 2021-02-11 MED ORDER — ENOXAPARIN SODIUM 40 MG/0.4ML ~~LOC~~ SOLN
40.0000 mg | SUBCUTANEOUS | Status: DC
  Administered 2021-02-11: 40 mg via SUBCUTANEOUS
  Filled 2021-02-11: qty 0.4

## 2021-02-11 MED ORDER — LISINOPRIL-HYDROCHLOROTHIAZIDE 10-12.5 MG PO TABS: 1.0000 | ORAL_TABLET | Freq: Every day | ORAL | Status: DC

## 2021-02-11 MED ORDER — ALPRAZOLAM 0.25 MG PO TABS
0.2500 mg | ORAL_TABLET | Freq: Two times a day (BID) | ORAL | Status: DC
  Administered 2021-02-11 – 2021-02-12 (×2): 0.25 mg via ORAL
  Filled 2021-02-11 (×2): qty 1

## 2021-02-11 MED ORDER — POTASSIUM CHLORIDE CRYS ER 20 MEQ PO TBCR
40.0000 meq | EXTENDED_RELEASE_TABLET | Freq: Once | ORAL | Status: AC
  Administered 2021-02-11: 40 meq via ORAL
  Filled 2021-02-11: qty 2

## 2021-02-11 MED ORDER — PANTOPRAZOLE SODIUM 40 MG PO TBEC
40.0000 mg | DELAYED_RELEASE_TABLET | Freq: Every day | ORAL | Status: DC
  Administered 2021-02-11 – 2021-02-12 (×2): 40 mg via ORAL
  Filled 2021-02-11 (×2): qty 1

## 2021-02-11 MED ORDER — HYDROCHLOROTHIAZIDE 12.5 MG PO CAPS
12.5000 mg | ORAL_CAPSULE | Freq: Every day | ORAL | Status: DC
  Administered 2021-02-12: 12.5 mg via ORAL
  Filled 2021-02-11: qty 1

## 2021-02-11 MED ORDER — ZOLPIDEM TARTRATE 5 MG PO TABS
10.0000 mg | ORAL_TABLET | Freq: Every day | ORAL | Status: DC
  Administered 2021-02-11: 10 mg via ORAL
  Filled 2021-02-11: qty 2

## 2021-02-11 MED ORDER — ONDANSETRON HCL 4 MG/2ML IJ SOLN: 4.0000 mg | Freq: Four times a day (QID) | INTRAMUSCULAR | Status: DC | PRN

## 2021-02-11 MED ORDER — VERAPAMIL HCL ER 180 MG PO TBCR
180.0000 mg | EXTENDED_RELEASE_TABLET | Freq: Every day | ORAL | Status: DC
  Administered 2021-02-11: 180 mg via ORAL
  Filled 2021-02-11 (×2): qty 1

## 2021-02-11 MED ORDER — FLUOXETINE HCL 20 MG PO CAPS
20.0000 mg | ORAL_CAPSULE | Freq: Every day | ORAL | Status: DC
  Administered 2021-02-12: 20 mg via ORAL
  Filled 2021-02-11: qty 1

## 2021-02-11 MED ORDER — ONDANSETRON HCL 4 MG/2ML IJ SOLN
4.0000 mg | Freq: Once | INTRAMUSCULAR | Status: AC
  Administered 2021-02-11: 4 mg via INTRAVENOUS
  Filled 2021-02-11: qty 2

## 2021-02-11 MED ORDER — LISINOPRIL 10 MG PO TABS
10.0000 mg | ORAL_TABLET | Freq: Every day | ORAL | Status: DC
  Administered 2021-02-12: 10 mg via ORAL
  Filled 2021-02-11: qty 1

## 2021-02-11 MED ORDER — ASPIRIN 81 MG PO CHEW
324.0000 mg | CHEWABLE_TABLET | Freq: Once | ORAL | Status: AC
  Administered 2021-02-11: 324 mg via ORAL
  Filled 2021-02-11: qty 4

## 2021-02-11 NOTE — ED Notes (Signed)
Pt ambulated to restroom and stated that she did not have any problems and felt fine.

## 2021-02-11 NOTE — Progress Notes (Signed)
Pt stated that she took her own meds from home: Prozac, Xanax, and Lisinopril/HCTZ. Dr Jamse Arn notified. Per MD it is okay for her to take scheduled xanax at 2200. Pt's medications sent home with husband.

## 2021-02-11 NOTE — ED Triage Notes (Signed)
Pt has hx of anxiety. Pt stopped taking fluoxetine and then started back this week. Pt stated that pt has had diarrhea and vomiting for past 3-4 days. This morning Pt's heart felt like it was racing.

## 2021-02-11 NOTE — ED Notes (Signed)
Pt denies pain but states that her heart is racing but no pain

## 2021-02-11 NOTE — Consult Note (Signed)
Cardiology Consultation:   Patient ID: Alexis Wall MRN: [id number]; DOB: [birth year]  Admit date: 02/11/2021 Date of Consult: 02/11/2021  PCP:  Leanna Battles, MD   [place]  Cardiologist:  No primary care provider on file. [id number]    Patient Profile:   Alexis Wall is a 55 y.o. female with a hx of obesity and anxiety who is being seen today for the evaluation of chest pressure at the request of Dr. Jamse Arn.  History of Present Illness:   Alexis Wall has a history of anxiety and morbid obesity and shortness of breath associated with anxiety.  Her symptoms worsened over the last couple of weeks but particularly worsened in the last few days.  The patient had discontinued her anxiety medications about 1 year ago.  She initially did fairly well but her symptoms have increased in frequency and severity.  She notes substernal chest pressure associated with her anxiousness.  She is fairly sedentary with her massive obesity and does not get around much.  She does not correlate symptoms with activity.  She has not had syncope.  She presented to the hospital for additional evaluation.  On my interview the patient is obviously anxious and nervous.  She does not have syncope.  There is no strong family history of heart disease.  She does not smoke cigarettes and is not diabetic.  She does have hypertension.   Past Medical History:  Diagnosis Date  . Anemia   . Arthritis    pains in knee  . Bipolar disorder ([place])    per Dr D.Patterson's office note 07/08/2007  . Depression    [facility]  . Diabetes mellitus    GESTATIONAL-not now  . GERD (gastroesophageal reflux disease)   . Hypertension   . Morbid obesity ([place])   . Polyp of colon   . Sleep apnea    Wears CPAP    Past Surgical History:  Procedure Laterality Date  . ABDOMINAL HYSTERECTOMY  03/21/2010   TAH  . BREATH TEK H PYLORI  08/05/2012   Procedure: BREATH TEK H PYLORI;  Surgeon: Pedro Earls, MD;  Location: [facility];  Service: General;  Laterality: N/A;  745  . CESAREAN SECTION  01/26/1999, 09/30/2002   X2.Marland Kitchen WITH BTSP  . CHOLECYSTECTOMY N/A 08/27/2018   Procedure: LAPAROSCOPIC CHOLECYSTECTOMY WITH INTRAOPERATIVE CHOLANGIOGRAM;  Surgeon: Excell Seltzer, MD;  Location: [facility];  Service: General;  Laterality: N/A;  . ENDOMETRIAL ABLATION  08/25/2009   HER OPTION      Home Medications:  Prior to Admission medications   Medication Sig Start Date End Date Taking? Authorizing Provider  ALPRAZolam (XANAX) 0.25 MG tablet Take 0.25 mg by mouth 2 (two) times daily.   Yes [provider]  lisinopril-hydrochlorothiazide (ZESTORETIC) 10-12.5 MG tablet Take 1 tablet by mouth daily. 01/06/21  Yes [provider]  omeprazole (PRILOSEC) 20 MG capsule Take 20 mg by mouth at bedtime.    Yes [provider]  verapamil (VERELAN PM) 180 MG 24 hr capsule Take 180 mg by mouth at bedtime. 06/20/18  Yes [provider]  zolpidem (AMBIEN) 10 MG tablet Take 10 mg by mouth at bedtime.    Yes [provider]  conjugated estrogens (PREMARIN) vaginal cream 1 applicator .625 mg every night for 7 nights and then 3 times a week Patient not taking: Reported on 02/11/2021 02/09/20   Huel Cote, NP  FLUoxetine (PROZAC) 20 MG capsule Take 20 mg by mouth daily. 02/08/21  [provider]    Inpatient Medications: Scheduled Meds: . enoxaparin (LOVENOX) injection  40 mg Subcutaneous Q24H   Continuous Infusions:  PRN Meds: acetaminophen, nitroGLYCERIN, ondansetron (ZOFRAN) IV  Allergies:    Allergies  Allergen Reactions  . Penicillins Rash    Happened in childhood, does not remember if it spread all over her body or not. Has patient had a PCN reaction causing immediate rash, facial/tongue/throat swelling, SOB or lightheadedness with hypotension: no Has patient had a PCN reaction causing severe rash involving mucus membranes or skin necrosis:  no Has patient had a PCN reaction that required hospitalization no Has patient had a PCN reaction occurring within the last 10 years: no If all of the above answers are "NO", then may proceed with C    Social History:   Social History   Socioeconomic History  . Marital status: Married    Spouse name: Not on file  . Number of children: Not on file  . Years of education: Not on file  . Highest education level: Not on file  Occupational History  . Not on file  Tobacco Use  . Smoking status: Former Smoker    Quit date: 05/03/1979    Years since quitting: 41.8  . Smokeless tobacco: Never Used  Vaping Use  . Vaping Use: Never used  Substance and Sexual Activity  . Alcohol use: Yes    Alcohol/week: 1.0 standard drink    Types: 1 Glasses of wine per week    Comment: occasional glass of wine once per week  . Drug use: No  . Sexual activity: Yes    Comment: TAH  Other Topics Concern  . Not on file  Social History Narrative  . Not on file   Social Determinants of Health   Financial Resource Strain: Not on file  Food Insecurity: Not on file  Transportation Needs: Not on file  Physical Activity: Not on file  Stress: Not on file  Social Connections: Not on file  Intimate Partner Violence: Not on file    Family History:    Family History  Problem Relation Age of Onset  . Hypertension Father   . Obesity Father   . Cancer Maternal Grandfather        colon  . Sleep apnea Mother   . Heart disease Paternal Grandfather   . Colon cancer Neg Hx      ROS:  Please see the history of present illness.   All other ROS reviewed and negative.     Physical Exam/Data:   Vitals:   02/11/21 1115 02/11/21 1145 02/11/21 1427 02/11/21 1531  BP: 121/68 122/73  (!) 140/113  Pulse: 75 75  90  Resp: 18 14  18   Temp:   98.3 F (36.8 C) 99.7 F (37.6 C)  TempSrc:   Oral Oral  SpO2: 96% 96%  96%  Weight:    113.2 kg  Height:    5\' 4"  (1.626 m)   No intake or output data in the 24  hours ending 02/11/21 1629 Last 3 Weights 02/11/2021 02/11/2021 05/16/2020  Weight (lbs) 249 lb 8 oz 265 lb 281 lb  Weight (kg) 113.172 kg 120.203 kg 127.461 kg     Body mass index is 42.83 kg/m.  General:  Well nourished, well developed, morbidly obese woman, in no acute distress HEENT: normal Lymph: no adenopathy Neck: no JVD Endocrine:  No thryomegaly Vascular: No carotid bruits; FA pulses 2+ bilaterally without bruits  Cardiac:  normal S1, S2; RRR;  no murmur  Lungs:  clear to auscultation bilaterally, no wheezing, rhonchi or rales  Abd: soft, nontender, no hepatomegaly  Ext: no edema Musculoskeletal:  No deformities, BUE and BLE strength normal and equal Skin: warm and dry  Neuro:  CNs 2-12 intact, no focal abnormalities noted Psych:  Normal affect   EKG:  The EKG was personally reviewed and demonstrates: Normal sinus rhythm with nonspecific T wave abnormality Telemetry:  Telemetry was personally reviewed and demonstrates: Normal sinus rhythm  Relevant CV Studies: 2D echo is pending  Laboratory Data:  High Sensitivity Troponin:   Recent Labs  Lab 02/11/21 1011 02/11/21 1211  TROPONINIHS 3 4     Chemistry Recent Labs  Lab 02/11/21 1011  NA 133*  K 3.3*  CL 96*  CO2 24  GLUCOSE 167*  BUN 12  CREATININE 0.84  CALCIUM 11.2*  GFRNONAA >60  ANIONGAP 13    Recent Labs  Lab 02/11/21 1011  PROT 7.6  ALBUMIN 4.4  AST 39  ALT 39  ALKPHOS 106  BILITOT 1.0   Hematology Recent Labs  Lab 02/11/21 1011  WBC 12.4*  RBC 5.36*  HGB 14.7  HCT 45.1  MCV 84.1  MCH 27.4  MCHC 32.6  RDW 13.8  PLT 373   BNPNo results for input(s): BNP, PROBNP in the last 168 hours.  DDimer  Recent Labs  Lab 02/11/21 1003  DDIMER 0.50     Radiology/Studies:  DG Chest Portable 1 View  Result Date: 02/11/2021 CLINICAL DATA:  Chest pain EXAM: PORTABLE CHEST 1 VIEW COMPARISON:  None. FINDINGS: The mediastinal contour is normal. The heart size is enlarged. Both lungs are  clear. The visualized skeletal structures are unremarkable. IMPRESSION: No active disease. Electronically Signed   By: Abelardo Diesel M.D.   On: 02/11/2021 10:54     Assessment and Plan:   1. Chest pressure -I think the patient's symptoms are likely noncardiac and likely related to her anxiety.  Coronary CT scanning would be the best evaluation in this patient.  This may be done as an outpatient as I do not think we can get this on the weekend.  If her EKG and enzymes remain unremarkable, she can be discharged for outpatient study. 2. Obesity -she has morbid obesity is encouraged to lose weight. 3. Anxiety -the patient stopped her medications but is now started fluoxetine back.  She is requesting benzodiazepines.  I will defer this to her primary physician.       For questions or updates, please contact [place] Please consult [email or web address] for contact info under    Signed, Cristopher Peru, MD  02/11/2021 4:29 PM

## 2021-02-11 NOTE — H&P (Signed)
History and Physical:    Alexis Wall   AVW:[id number] DOB: [birth year] DOA: 02/11/2021  Referring MD/provider: Transfer from [place] for chest pain PCP: Leanna Battles, MD   Patient coming from: Home  Chief Complaint: Chest pain  History of Present Illness:   Alexis Wall is an 55 y.o. female with PMH significant for MDD status post ECT, Anxiety, HTN and GERD, obesity who has been going through significant anxiety depression over the past 1 to 2 weeks and presented to droppage for chest pain.  Patient states she woke up with substernal chest pressure with radiation up to bilateral shoulders.  She did have associated shortness of breath.  She did have associated nausea and she vomited a couple of times.  Patient states this pressure with the shortness of breath worried her so she came to the ED.  She had some diaphoresis with this however not been drenching.  Patient states she has never had this kind of chest discomfort before.  Patient notes that she usually walks 5 minutes in front of her house every day.  She was able to walk yesterday evening without any chest pressure.  She does have some mild DOE which she attributes to deconditioning.  Patient denies any previous history of coronary artery disease.  No MI, no congestive heart failure, denies any knowledge of hypertensive heart disease.  Of note patient was recently started on her SSRI in the past couple of weeks.  She was also started on Xanax.  ED Course:  The patient was noted to be hypertensive and have chest pain when she presented.  She was treated with nitroglycerin with improvement in her chest discomfort but not resolution.  EKG was done which was thought to be somewhat atypical with possible half millimeter elevations in aVR with some half millimeter depressions V5 through V6.  This was discussed with Dr. Irish Lack who did not feel this was a STEMI.  Troponins were negative at 3 and 4 on repeat.  Patient was  transferred here for possible cardiac evaluation.  ROS:   ROS   Review of Systems: General: Denies fever, chills, malaise,  Respiratory: Denies cough, or hemoptysis GU: Denies dysuria, frequency or hematuria   Past Medical History:   Past Medical History:  Diagnosis Date  . Anemia   . Arthritis    pains in knee  . Bipolar disorder ([place])    per Dr D.Patterson's office note 07/08/2007  . Depression    [facility]  . Diabetes mellitus    GESTATIONAL-not now  . GERD (gastroesophageal reflux disease)   . Hypertension   . Morbid obesity ([place])   . Polyp of colon   . Sleep apnea    Wears CPAP    Past Surgical History:   Past Surgical History:  Procedure Laterality Date  . ABDOMINAL HYSTERECTOMY  03/21/2010   TAH  . BREATH TEK H PYLORI  08/05/2012   Procedure: BREATH TEK H PYLORI;  Surgeon: Pedro Earls, MD;  Location: [facility];  Service: General;  Laterality: N/A;  745  . CESAREAN SECTION  01/26/1999, 09/30/2002   X2.Marland Kitchen WITH BTSP  . CHOLECYSTECTOMY N/A 08/27/2018   Procedure: LAPAROSCOPIC CHOLECYSTECTOMY WITH INTRAOPERATIVE CHOLANGIOGRAM;  Surgeon: Excell Seltzer, MD;  Location: [facility];  Service: General;  Laterality: N/A;  . ENDOMETRIAL ABLATION  08/25/2009   HER OPTION     Social History:   Social History   Socioeconomic History  . Marital status: Married    Spouse name: Not  on file  . Number of children: Not on file  . Years of education: Not on file  . Highest education level: Not on file  Occupational History  . Not on file  Tobacco Use  . Smoking status: Former Smoker    Quit date: 05/03/1979    Years since quitting: 41.8  . Smokeless tobacco: Never Used  Vaping Use  . Vaping Use: Never used  Substance and Sexual Activity  . Alcohol use: Yes    Alcohol/week: 1.0 standard drink    Types: 1 Glasses of wine per week    Comment: occasional glass of wine once per week  . Drug use: No  . Sexual activity: Yes    Comment: TAH  Other Topics  Concern  . Not on file  Social History Narrative  . Not on file   Social Determinants of Health   Financial Resource Strain: Not on file  Food Insecurity: Not on file  Transportation Needs: Not on file  Physical Activity: Not on file  Stress: Not on file  Social Connections: Not on file  Intimate Partner Violence: Not on file    Allergies   Penicillins  Family history:   Family History  Problem Relation Age of Onset  . Hypertension Father   . Obesity Father   . Cancer Maternal Grandfather        colon  . Sleep apnea Mother   . Heart disease Paternal Grandfather   . Colon cancer Neg Hx     Current Medications:   Prior to Admission medications   Medication Sig Start Date End Date Taking? Authorizing Provider  ALPRAZolam (XANAX) 0.25 MG tablet Take 0.25 mg by mouth 1 day or 1 dose.   Yes [provider]  FLUoxetine (PROZAC) 20 MG capsule Take 20 mg by mouth daily. 02/08/21  Yes [provider]  lisinopril-hydrochlorothiazide (ZESTORETIC) 10-12.5 MG tablet Take 1 tablet by mouth daily. 01/06/21  Yes [provider]  verapamil (VERELAN PM) 180 MG 24 hr capsule Take 180 mg by mouth at bedtime. 06/20/18  Yes [provider]  zolpidem (AMBIEN) 10 MG tablet Take 10 mg by mouth at bedtime.    Yes [provider]  conjugated estrogens (PREMARIN) vaginal cream 1 applicator .625 mg every night for 7 nights and then 3 times a week 02/09/20   Huel Cote, NP  omeprazole (PRILOSEC) 20 MG capsule Take 20 mg by mouth at bedtime.     [provider]    Physical Exam:   Vitals:   02/11/21 1115 02/11/21 1145 02/11/21 1427 02/11/21 1531  BP: 121/68 122/73  (!) 140/113  Pulse: 75 75  90  Resp: 18 14  18   Temp:   98.3 F (36.8 C) 99.7 F (37.6 C)  TempSrc:   Oral Oral  SpO2: 96% 96%  96%  Weight:    113.2 kg  Height:    5\' 4"  (1.626 m)     Physical Exam: Blood pressure (!) 140/113, pulse 90, temperature 99.7 F (37.6 C),  temperature source Oral, resp. rate 18, height 5\' 4"  (1.626 m), weight 113.2 kg, SpO2 96 %. Gen: Obese female making her bed when I walked into the room.  She is in no apparent distress. Eyes: sclera anicteric, conjuctiva mildly injected bilaterally CVS: Distant heart sounds secondary to body habitus Respiratory: CTA without rales GI: NABS, soft, NT  LE: No edema. No cyanosis Neuro:  grossly nonfocal.  Psych: Patient states she is feeling very anxious.  Data Review:    Labs: Basic Metabolic Panel: Recent Labs  Lab 02/11/21 1011  NA 133*  K 3.3*  CL 96*  CO2 24  GLUCOSE 167*  BUN 12  CREATININE 0.84  CALCIUM 11.2*   Liver Function Tests: Recent Labs  Lab 02/11/21 1011  AST 39  ALT 39  ALKPHOS 106  BILITOT 1.0  PROT 7.6  ALBUMIN 4.4   No results for input(s): LIPASE, AMYLASE in the last 168 hours. No results for input(s): AMMONIA in the last 168 hours. CBC: Recent Labs  Lab 02/11/21 1011  WBC 12.4*  NEUTROABS 8.6*  HGB 14.7  HCT 45.1  MCV 84.1  PLT 373   Cardiac Enzymes: No results for input(s): CKTOTAL, CKMB, CKMBINDEX, TROPONINI in the last 168 hours.  BNP (last 3 results) No results for input(s): PROBNP in the last 8760 hours. CBG: No results for input(s): GLUCAP in the last 168 hours.  Urinalysis    Component Value Date/Time   COLORURINE COLORLESS (A) 02/11/2021 1408   APPEARANCEUR CLEAR 02/11/2021 1408   LABSPEC 1.003 (L) 02/11/2021 1408   PHURINE 6.0 02/11/2021 1408   GLUCOSEU NEGATIVE 02/11/2021 1408   HGBUR NEGATIVE 02/11/2021 1408   BILIRUBINUR NEGATIVE 02/11/2021 1408   KETONESUR NEGATIVE 02/11/2021 1408   PROTEINUR NEGATIVE 02/11/2021 1408   UROBILINOGEN 0.2 03/20/2010 1007   NITRITE NEGATIVE 02/11/2021 1408   LEUKOCYTESUR TRACE (A) 02/11/2021 1408      Radiographic Studies: DG Chest Portable 1 View  Result Date: 02/11/2021 CLINICAL DATA:  Chest pain EXAM: PORTABLE CHEST 1 VIEW COMPARISON:  None. FINDINGS: The mediastinal  contour is normal. The heart size is enlarged. Both lungs are clear. The visualized skeletal structures are unremarkable. IMPRESSION: No active disease. Electronically Signed   By: Abelardo Diesel M.D.   On: 02/11/2021 10:54    EKG: Independently reviewed.  Sinus rhythm at 96.  First-degree heart block.  Poor baseline.  May be half millimeter ST depressions 2, 3, F and V5 and V6.   Assessment/Plan:   Principal Problem:   Chest pain Active Problems:   Bipolar disorder (HCC)   Chronic intermittent hypoxia with obstructive sleep apnea  55 year old female with HTN, GERD and severe anxiety/depression is admitted with atypical chest pain.   Chest pain Patient with obesity and hypertension with atypical chest pain and normal troponin EKG shows minimal scooping of ST segments in inferolateral areas However troponins are negative at 3 and 4 Patient received aspirin earlier in the ED Cardiology consultation called and await their recommendations repeat need for cardiac testing  Hyponatremia Unclear what patient's baseline sodium is however 3 years ago it was 141 Of note patient was recently started on SSRI which can certainly cause hyponatremia She is also on HCTZ and lisinopril which can certainly all contribute to hyponatremia She has also been vomiting which can also cause hyponatremia We will hydrate gently and recheck in the morning Whether or not to continue Prozac and HCTZ per PCP In any case her sodium will need to be followed very closely after discharge  Hypokalemia Will replete and recheck  Anxiety and depression Restart Prozac and Xanax per home doses  HTN Restart BP meds once they have been reconciled     Other information:   DVT prophylaxis: Enoxaparin ordered. Code Status: Full Family Communication: Patient states she is in communication with her husband Disposition Plan: Home Consults called: Cardiology Admission status: Observation  Alexis Wall  Alexis Wall [facility]  If 7PM-7AM, please contact night-coverage [email or web address] Password  [email or web address] 02/11/2021, 4:25 PM

## 2021-02-11 NOTE — ED Provider Notes (Addendum)
[facility] Provider Note   CSN: [id number] Arrival date & time: 02/11/21  0946     History Chief Complaint  Patient presents with  . Chest Pain    Alexis Wall is a 55 y.o. female.  HPI     55yo female with history of hypertension, OSA, obesity, gestational and borderline DM, presents with concern for chest pain.   Reports she had stopped fluoxetine 6 months ago then began to have episodes of anxiety worsening over the last 1.5wk.  Describes chest heaviness, tingling across the chest, palpitations, sensation of anxiety, nausea, diarrhea multiple times daily over the last 10 days. Reports due to her anxiety symptoms, her doctor had given new rx for fluoxetine.  Which she started on 3/16.  Comes in today because of waking with worsening chest heaviness, again described as a heaviness across chest radiating to bilateral shoulders with associated nausea, vomiting today, mild dyspnea, mild diaphoresis.  Pain currently 4-5/10.  No hx of similar pain.  Reports it worsens with stress, improves with rest however over the last week has not been exertional, was able to go for a few walks without symptoms.  Dyspnea on exertion just walking around home, however. No fever, cough, change in appetite or significant abdominal pain.  Father had hx of CAD, found on autopsy at age 44, diagnosed with htn in his 57s but did not have stent placement or other cardiac evaluation. NO smoking, etoh, drugs. No hx of leg pain or swelling, no long trips car or airplane, no recent surgeries or immobilization.    Past Medical History:  Diagnosis Date  . Anemia   . Arthritis    pains in knee  . Bipolar disorder ([place])    per Dr D.Patterson's office note 07/08/2007  . Depression    [facility]  . Diabetes mellitus    GESTATIONAL-not now  . GERD (gastroesophageal reflux disease)   . Hypertension   . Morbid obesity ([place])   . Polyp of colon   . Sleep apnea    Wears CPAP     Patient Active Problem List   Diagnosis Date Noted  . Chronic intermittent hypoxia with obstructive sleep apnea 03/15/2020  . Severe obstructive sleep apnea-hypopnea syndrome 02/10/2020  . Class 3 severe obesity due to excess calories with serious comorbidity and body mass index (BMI) of 45.0 to 49.9 in adult ([place]) 02/10/2020  . Hypertension   . GERD (gastroesophageal reflux disease)   . Bipolar disorder ([place])   . Sleep apnea   . Anemia   . Acute on chronic cholecystitis s/p lap cholecystectomy 08/27/2018 08/27/2018  . Menopausal hot flushes 08/26/2013  . Morbid obesity, Weight - 239, BMI - 41.07. 05/02/2012  . COLONIC POLYPS, HYPERPLASTIC 03/01/2008  . DEPRESSION 03/01/2008  . HEMORRHOIDS 03/01/2008    Past Surgical History:  Procedure Laterality Date  . ABDOMINAL HYSTERECTOMY  03/21/2010   TAH  . BREATH TEK H PYLORI  08/05/2012   Procedure: BREATH TEK H PYLORI;  Surgeon: Pedro Earls, MD;  Location: [facility];  Service: General;  Laterality: N/A;  745  . CESAREAN SECTION  01/26/1999, 09/30/2002   X2.Marland Kitchen WITH BTSP  . CHOLECYSTECTOMY N/A 08/27/2018   Procedure: LAPAROSCOPIC CHOLECYSTECTOMY WITH INTRAOPERATIVE CHOLANGIOGRAM;  Surgeon: Excell Seltzer, MD;  Location: [facility];  Service: General;  Laterality: N/A;  . ENDOMETRIAL ABLATION  08/25/2009   HER OPTION      OB History    Gravida  3   Para  2  Term  2   Preterm      AB  1   Living  2     SAB  1   IAB      Ectopic      Multiple      Live Births  2           Family History  Problem Relation Age of Onset  . Hypertension Father   . Obesity Father   . Cancer Maternal Grandfather        colon  . Sleep apnea Mother   . Heart disease Paternal Grandfather   . Colon cancer Neg Hx     Social History   Tobacco Use  . Smoking status: Former Smoker    Quit date: 05/03/1979    Years since quitting: 41.8  . Smokeless tobacco: Never Used  Vaping Use  . Vaping Use: Never used  Substance Use  Topics  . Alcohol use: Yes    Alcohol/week: 1.0 standard drink    Types: 1 Glasses of wine per week    Comment: occasional glass of wine once per week  . Drug use: No    Home Medications Prior to Admission medications   Medication Sig Start Date End Date Taking? Authorizing Provider  ALPRAZolam (XANAX) 0.25 MG tablet Take 0.25 mg by mouth 1 day or 1 dose.   Yes [provider]  FLUoxetine (PROZAC) 20 MG capsule Take 20 mg by mouth daily. 02/08/21  Yes [provider]  lisinopril-hydrochlorothiazide (ZESTORETIC) 10-12.5 MG tablet Take 1 tablet by mouth daily. 01/06/21  Yes [provider]  verapamil (VERELAN PM) 180 MG 24 hr capsule Take 180 mg by mouth at bedtime. 06/20/18  Yes [provider]  zolpidem (AMBIEN) 10 MG tablet Take 10 mg by mouth at bedtime.    Yes [provider]  conjugated estrogens (PREMARIN) vaginal cream 1 applicator .625 mg every night for 7 nights and then 3 times a week 02/09/20   Huel Cote, NP  omeprazole (PRILOSEC) 20 MG capsule Take 20 mg by mouth at bedtime.     [provider]    Allergies    Penicillins  Review of Systems   Review of Systems  Constitutional: Positive for diaphoresis and fatigue. Negative for fever.  HENT: Negative for sore throat.   Eyes: Negative for visual disturbance.  Respiratory: Positive for shortness of breath. Negative for cough.   Cardiovascular: Positive for chest pain and palpitations.  Gastrointestinal: Positive for diarrhea, nausea and vomiting. Negative for abdominal pain.  Genitourinary: Negative for difficulty urinating.  Musculoskeletal: Negative for back pain and neck pain.  Skin: Negative for rash.  Neurological: Negative for syncope, light-headedness and headaches.  Psychiatric/Behavioral: The patient is nervous/anxious.     Physical Exam Updated Vital Signs BP (!) 144/87 (BP Location: Left Arm)   Pulse 99   Temp 99.2 F (37.3 C) (Oral)   Resp 20   Ht  5\' 4"  (1.626 m)   Wt 120.2 kg   SpO2 99%   BMI 45.49 kg/m   Physical Exam Vitals and nursing note reviewed.  Constitutional:      General: She is not in acute distress.    Appearance: She is well-developed. She is ill-appearing. She is not diaphoretic.  HENT:     Head: Normocephalic and atraumatic.  Eyes:     Conjunctiva/sclera: Conjunctivae normal.  Cardiovascular:     Rate and Rhythm: Normal rate and regular rhythm.     Heart sounds:  Normal heart sounds. No murmur heard. No friction rub. No gallop.   Pulmonary:     Effort: Pulmonary effort is normal. No respiratory distress.     Breath sounds: Normal breath sounds. No wheezing or rales.  Abdominal:     General: There is no distension.     Palpations: Abdomen is soft.     Tenderness: There is no abdominal tenderness. There is no guarding.  Musculoskeletal:        General: No tenderness.     Cervical back: Normal range of motion.  Skin:    General: Skin is warm and dry.     Findings: No erythema or rash.  Neurological:     Mental Status: She is alert and oriented to person, place, and time.     ED Results / Procedures / Treatments   Labs (all labs ordered are listed, but only abnormal results are displayed) Labs Reviewed  CBC WITH DIFFERENTIAL/PLATELET - Abnormal; Notable for the following components:      Result Value   WBC 12.4 (*)    RBC 5.36 (*)    Neutro Abs 8.6 (*)    Monocytes Absolute 1.2 (*)    All other components within normal limits  COMPREHENSIVE METABOLIC PANEL - Abnormal; Notable for the following components:   Sodium 133 (*)    Potassium 3.3 (*)    Chloride 96 (*)    Glucose, Bld 167 (*)    Calcium 11.2 (*)    All other components within normal limits  RESP PANEL BY RT-PCR (FLU A&B, COVID) ARPGX2  TROPONIN I (HIGH SENSITIVITY)    EKG None  Radiology DG Chest Portable 1 View  Result Date: 02/11/2021 CLINICAL DATA:  Chest pain EXAM: PORTABLE CHEST 1 VIEW COMPARISON:  None. FINDINGS: The  mediastinal contour is normal. The heart size is enlarged. Both lungs are clear. The visualized skeletal structures are unremarkable. IMPRESSION: No active disease. Electronically Signed   By: Abelardo Diesel M.D.   On: 02/11/2021 10:54    Procedures Procedures   Medications Ordered in ED Medications  nitroGLYCERIN (NITROSTAT) SL tablet 0.4 mg (has no administration in time range)  aspirin chewable tablet 324 mg (324 mg Oral Given 02/11/21 1021)  ondansetron (ZOFRAN) injection 4 mg (4 mg Intravenous Given 02/11/21 1019)    ED Course  I have reviewed the triage vital signs and the nursing notes.  Pertinent labs & imaging results that were available during my care of the patient were reviewed by me and considered in my medical decision making (see chart for details).    MDM Rules/Calculators/A&P                          55yo female with history of hypertension, OSA, obesity, gestational and borderline DM, presents with concern for chest pain.    EKG reviewed by me and showed new STE in aVR compared to prior and mild depressions in other leads. Called Dr. Irish Lack, STEMI doctor on call regarding ECG for immediate review, and does not feel changes consistent with STEMI requiring cath lab activation.   Differential diagnosis for chest pain includes pulmonary embolus, dissection, pneumothorax, pneumonia, ACS, myocarditis, pericarditis.  Chest x-ray was done and evaluated by me and radiology and showed no sign of pneumonia or pneumothorax.  Do not feel history or exam are consistent with aortic dissection.  No signs of anemia. Very mild hypokalemia and hypercalcemia (corrected Ca 10.9.)  Troponin 3.   Chest pressure  improved with nitroglycerin.  DDimer negative, low risk wells, doubt PE.  COVID testing negative.   HEART score is a 5.  Given risk factors, ECG changes, description of symptoms, feel admission for continued cardiac evaluation most appropriate.     Noted to have urinary frequency  while waiting for bed in ED< UA ordered to evaluate in setting of nausea, mildly elevated wbc.   Final Clinical Impression(s) / ED Diagnoses Final diagnoses:  Chest pain with high risk for cardiac etiology    Rx / DC Orders ED Discharge Orders    None       Gareth Morgan, MD 02/11/21 1320

## 2021-02-12 DIAGNOSIS — F3170 Bipolar disorder, currently in remission, most recent episode unspecified: Secondary | ICD-10-CM

## 2021-02-12 DIAGNOSIS — R072 Precordial pain: Secondary | ICD-10-CM

## 2021-02-12 DIAGNOSIS — I10 Essential (primary) hypertension: Secondary | ICD-10-CM | POA: Diagnosis not present

## 2021-02-12 DIAGNOSIS — R079 Chest pain, unspecified: Secondary | ICD-10-CM

## 2021-02-12 DIAGNOSIS — E119 Type 2 diabetes mellitus without complications: Secondary | ICD-10-CM | POA: Diagnosis not present

## 2021-02-12 DIAGNOSIS — Z20822 Contact with and (suspected) exposure to covid-19: Secondary | ICD-10-CM | POA: Diagnosis not present

## 2021-02-12 LAB — URINE CULTURE

## 2021-02-12 MED ORDER — LOPERAMIDE HCL 2 MG PO CAPS
2.0000 mg | ORAL_CAPSULE | ORAL | Status: DC | PRN
  Administered 2021-02-12: 2 mg via ORAL
  Filled 2021-02-12: qty 1

## 2021-02-12 NOTE — Progress Notes (Signed)
Progress Note  Patient Name: Alexis Wall Date of Encounter: 02/12/2021  Primary Cardiologist: No primary care provider on file.   Subjective   Denies chest pain or shortness of breath.  Less anxious  Inpatient Medications    Scheduled Meds: . ALPRAZolam  0.25 mg Oral BID  . enoxaparin (LOVENOX) injection  40 mg Subcutaneous Q24H  . FLUoxetine  20 mg Oral Daily  . lisinopril  10 mg Oral Daily   And  . hydrochlorothiazide  12.5 mg Oral Daily  . pantoprazole  40 mg Oral Daily  . verapamil  180 mg Oral QHS  . zolpidem  10 mg Oral QHS   Continuous Infusions:  PRN Meds: acetaminophen, loperamide, nitroGLYCERIN, ondansetron (ZOFRAN) IV   Vital Signs    Vitals:   02/11/21 2211 02/12/21 0016 02/12/21 0516 02/12/21 1101  BP: (!) 112/59 128/61 (!) 130/91 (!) 131/56  Pulse:  80 67 73  Resp:  20 18 16   Temp:  98.8 F (37.1 C) 98.8 F (37.1 C) 98.6 F (37 C)  TempSrc:  Axillary Oral Oral  SpO2:  97% 99% 99%  Weight:   113.4 kg   Height:        Intake/Output Summary (Last 24 hours) at 02/12/2021 1121 Last data filed at 02/12/2021 5102 Gross per 24 hour  Intake 1392.28 ml  Output --  Net 1392.28 ml   Filed Weights   02/11/21 0953 02/11/21 1531 02/12/21 0516  Weight: 120.2 kg 113.2 kg 113.4 kg    Telemetry    Normal sinus rhythm- Personally Reviewed  ECG    Normal sinus rhythm with no ST-T wave abnormality- Personally Reviewed  Physical Exam   GEN: No acute distress.   Neck: No JVD Cardiac: RRR, no murmurs, rubs, or gallops.  Respiratory: Clear to auscultation bilaterally. GI: Soft, nontender, non-distended  MS: No edema; No deformity. Neuro:  Nonfocal  Psych: Normal affect   Labs    Chemistry Recent Labs  Lab 02/11/21 1011 02/11/21 1628  NA 133*  --   K 3.3*  --   CL 96*  --   CO2 24  --   GLUCOSE 167*  --   BUN 12  --   CREATININE 0.84 0.81  CALCIUM 11.2*  --   PROT 7.6  --   ALBUMIN 4.4  --   AST 39  --   ALT 39  --   ALKPHOS 106   --   BILITOT 1.0  --   GFRNONAA >60 >60  ANIONGAP 13  --      Hematology Recent Labs  Lab 02/11/21 1011 02/11/21 1628  WBC 12.4* 13.9*  RBC 5.36* 5.40*  HGB 14.7 15.1*  HCT 45.1 45.0  MCV 84.1 83.3  MCH 27.4 28.0  MCHC 32.6 33.6  RDW 13.8 13.5  PLT 373 369    Cardiac EnzymesNo results for input(s): TROPONINI in the last 168 hours. No results for input(s): TROPIPOC in the last 168 hours.   BNPNo results for input(s): BNP, PROBNP in the last 168 hours.   DDimer  Recent Labs  Lab 02/11/21 1003  DDIMER 0.50     Radiology    DG Chest Portable 1 View  Result Date: 02/11/2021 CLINICAL DATA:  Chest pain EXAM: PORTABLE CHEST 1 VIEW COMPARISON:  None. FINDINGS: The mediastinal contour is normal. The heart size is enlarged. Both lungs are clear. The visualized skeletal structures are unremarkable. IMPRESSION: No active disease. Electronically Signed   By: Mallie Darting.D.  On: 02/11/2021 10:54    Cardiac Studies   Serial enzymes are negative and ECG is nonischemic  Patient Profile     55 y.o. female admitted with atypical, noncardiac chest pain and anxiety, ruled out for MI, chest pain resolved  Assessment & Plan    1.  Atypical chest pain -her pain is resolved.  I suspect partly related to anxiety.  She does have some cardiac risk factors.  I would recommend an outpatient coronary CT scan 2.  Anxiety -this is one of her biggest problems.  She appears more calm today.  She will need additional outpatient management. 3.  Obesity -her BMI is almost 43.  She will need to lose weight.  She is encouraged to increase her physical activity. [facility] will sign off.   Medication Recommendations:  none Other recommendations (labs, testing, etc): Coronary CT scanning which will be scheduled Follow up as an outpatient: Following the results of the CT scan  For questions or updates, please contact [facility] Please consult [email or web address] for contact info under  Cardiology/STEMI.      Signed, Cristopher Peru, MD  02/12/2021, 11:21 AM

## 2021-02-12 NOTE — Discharge Summary (Signed)
Physician Discharge Summary  Alexis Wall [id number] DOB: Feb 21, 1966 DOA: 02/11/2021  PCP: Alexis Battles, MD  Admit date: 02/11/2021 Discharge date: 02/12/2021  Time spent: 35 minutes  Recommendations for Outpatient Follow-up:  1. [facility] for follow-up, consider coronary CTA   Discharge Diagnoses:  Principal Problem:   Chest pain Anxiety Depression   Morbid obesity, Weight - 239, BMI - 41.07.   Bipolar disorder ([place])   Chronic intermittent hypoxia with obstructive sleep apnea   Discharge Condition: Stable  Diet recommendation: Heart healthy  Filed Weights   02/11/21 0953 02/11/21 1531 02/12/21 0516  Weight: 120.2 kg 113.2 kg 113.4 kg    History of present illness:  Alexis Wall is an 55 y.o. female with PMH significant for MDD status post ECT, Anxiety, HTN and GERD, obesity who has been going through significant anxiety depression over the past 1 to 2 weeks and presented to  To the ED with chest pain.  Patient states she woke up with substernal chest pressure with radiation up to bilateral shoulders, associated shortness of breath, nausea, and diarrhea  Hospital Course:   Atypical chest pain -Ruled out for ACS, high-sensitivity troponins were negative -EKG noted nonspecific T wave abnormalities -Seen by cardiology Dr. Lovena Le in consultation, felt to be stable for discharge home, recommended outpatient coronary CTA and follow-up  Anxiety, depression -Continue home meds  Morbid obesity -BMI 43, needs diet, lifestyle modification  Consultations:  Cardiology Dr.Taylor  Discharge Exam: Vitals:   02/12/21 0516 02/12/21 1101  BP: (!) 130/91 (!) 131/56  Pulse: 67 73  Resp: 18 16  Temp: 98.8 F (37.1 C) 98.6 F (37 C)  SpO2: 99% 99%    General: AAOx3 Cardiovascular: S1S2/RRR Respiratory: CTAB  Discharge Instructions   Discharge Instructions    Diet - low sodium heart healthy   Complete by: As directed    Diet Carb Modified   Complete by:  As directed    Increase activity slowly   Complete by: As directed      Allergies as of 02/12/2021      Reactions   Penicillins Rash   Happened in childhood, does not remember if it spread all over her body or not. Has patient had a PCN reaction causing immediate rash, facial/tongue/throat swelling, SOB or lightheadedness with hypotension: no Has patient had a PCN reaction causing severe rash involving mucus membranes or skin necrosis: no Has patient had a PCN reaction that required hospitalization no Has patient had a PCN reaction occurring within the last 10 years: no If all of the above answers are "NO", then may proceed with C      Medication List    STOP taking these medications   Premarin vaginal cream Generic drug: conjugated estrogens     TAKE these medications   ALPRAZolam 0.25 MG tablet Commonly known as: XANAX Take 0.25 mg by mouth 2 (two) times daily.   FLUoxetine 20 MG capsule Commonly known as: PROZAC Take 20 mg by mouth daily.   lisinopril-hydrochlorothiazide 10-12.5 MG tablet Commonly known as: ZESTORETIC Take 1 tablet by mouth daily.   omeprazole 20 MG capsule Commonly known as: PRILOSEC Take 20 mg by mouth at bedtime.   verapamil 180 MG 24 hr capsule Commonly known as: VERELAN PM Take 180 mg by mouth at bedtime.   zolpidem 10 MG tablet Commonly known as: AMBIEN Take 10 mg by mouth at bedtime.      Allergies  Allergen Reactions  . Penicillins Rash    Happened  in childhood, does not remember if it spread all over her body or not. Has patient had a PCN reaction causing immediate rash, facial/tongue/throat swelling, SOB or lightheadedness with hypotension: no Has patient had a PCN reaction causing severe rash involving mucus membranes or skin necrosis: no Has patient had a PCN reaction that required hospitalization no Has patient had a PCN reaction occurring within the last 10 years: no If all of the above answers are "NO", then may proceed with  C    Follow-up Information    Cardiology Follow up.   Why: [facility] will call you to schedule Coronary CT scan               The results of significant diagnostics from this hospitalization (including imaging, microbiology, ancillary and laboratory) are listed below for reference.    Significant Diagnostic Studies: DG Chest Portable 1 View  Result Date: 02/11/2021 CLINICAL DATA:  Chest pain EXAM: PORTABLE CHEST 1 VIEW COMPARISON:  None. FINDINGS: The mediastinal contour is normal. The heart size is enlarged. Both lungs are clear. The visualized skeletal structures are unremarkable. IMPRESSION: No active disease. Electronically Signed   By: Abelardo Diesel M.D.   On: 02/11/2021 10:54    Microbiology: Recent Results (from the past 240 hour(s))  Resp Panel by RT-PCR (Flu A&B, Covid) Nasopharyngeal Swab     Status: None   Collection Time: 02/11/21 10:25 AM   Specimen: Nasopharyngeal Swab; Nasopharyngeal(NP) swabs in vial transport medium  Result Value Ref Range Status   SARS Coronavirus 2 by RT PCR NEGATIVE NEGATIVE Final    Comment: (NOTE) SARS-CoV-2 target nucleic acids are NOT DETECTED.  The SARS-CoV-2 RNA is generally detectable in upper respiratory specimens during the acute phase of infection. The lowest concentration of SARS-CoV-2 viral copies this assay can detect is 138 copies/mL. A negative result does not preclude SARS-Cov-2 infection and should not be used as the sole basis for treatment or other patient management decisions. A negative result may occur with  improper specimen collection/handling, submission of specimen other than nasopharyngeal swab, presence of viral mutation(s) within the areas targeted by this assay, and inadequate number of viral copies(<138 copies/mL). A negative result must be combined with clinical observations, patient history, and epidemiological information. The expected result is Negative.  Fact Sheet for Patients:   EntrepreneurPulse.com.au  Fact Sheet for Healthcare Providers:  IncredibleEmployment.be  This test is no t yet approved or cleared by the [place] FDA and  has been authorized for detection and/or diagnosis of SARS-CoV-2 by FDA under an Emergency Use Authorization (EUA). This EUA will remain  in effect (meaning this test can be used) for the duration of the COVID-19 declaration under Section 564(b)(1) of the Act, 21 U.S.C.section 360bbb-3(b)(1), unless the authorization is terminated  or revoked sooner.       Influenza A by PCR NEGATIVE NEGATIVE Final   Influenza B by PCR NEGATIVE NEGATIVE Final    Comment: (NOTE) The Xpert Xpress SARS-CoV-2/FLU/RSV plus assay is intended as an aid in the diagnosis of influenza from Nasopharyngeal swab specimens and should not be used as a sole basis for treatment. Nasal washings and aspirates are unacceptable for Xpert Xpress SARS-CoV-2/FLU/RSV testing.  Fact Sheet for Patients: EntrepreneurPulse.com.au  Fact Sheet for Healthcare Providers: IncredibleEmployment.be  This test is not yet approved or cleared by the [place] FDA and has been authorized for detection and/or diagnosis of SARS-CoV-2 by FDA under an Emergency Use Authorization (EUA). This EUA will remain in effect (  meaning this test can be used) for the duration of the COVID-19 declaration under Section 564(b)(1) of the Act, 21 U.S.C. section 360bbb-3(b)(1), unless the authorization is terminated or revoked.  Performed at [facility]      Labs: Basic Metabolic Panel: Recent Labs  Lab 02/11/21 1011 02/11/21 1628  NA 133*  --   K 3.3*  --   CL 96*  --   CO2 24  --   GLUCOSE 167*  --   BUN 12  --   CREATININE 0.84 0.81  CALCIUM 11.2*  --    Liver Function Tests: Recent Labs  Lab 02/11/21 1011  AST 39  ALT 39  ALKPHOS 106  BILITOT 1.0  PROT 7.6  ALBUMIN 4.4   No  results for input(s): LIPASE, AMYLASE in the last 168 hours. No results for input(s): AMMONIA in the last 168 hours. CBC: Recent Labs  Lab 02/11/21 1011 02/11/21 1628  WBC 12.4* 13.9*  NEUTROABS 8.6*  --   HGB 14.7 15.1*  HCT 45.1 45.0  MCV 84.1 83.3  PLT 373 369   Cardiac Enzymes: No results for input(s): CKTOTAL, CKMB, CKMBINDEX, TROPONINI in the last 168 hours. BNP: BNP (last 3 results) No results for input(s): BNP in the last 8760 hours.  ProBNP (last 3 results) No results for input(s): PROBNP in the last 8760 hours.  CBG: No results for input(s): GLUCAP in the last 168 hours.     Signed:  Domenic Polite MD.  [facility] 02/12/2021, 12:03 PM

## 2021-02-13 DIAGNOSIS — F419 Anxiety disorder, unspecified: Secondary | ICD-10-CM | POA: Diagnosis not present

## 2021-02-13 DIAGNOSIS — R110 Nausea: Secondary | ICD-10-CM | POA: Diagnosis not present

## 2021-02-22 ENCOUNTER — Ambulatory Visit: Payer: BC Managed Care – PPO | Admitting: Nurse Practitioner

## 2021-02-22 ENCOUNTER — Ambulatory Visit: Payer: BC Managed Care – PPO | Admitting: Cardiology

## 2021-02-23 ENCOUNTER — Ambulatory Visit: Payer: BC Managed Care – PPO | Admitting: Cardiology

## 2021-03-24 DIAGNOSIS — I10 Essential (primary) hypertension: Secondary | ICD-10-CM | POA: Diagnosis not present

## 2021-04-20 DIAGNOSIS — F332 Major depressive disorder, recurrent severe without psychotic features: Secondary | ICD-10-CM | POA: Diagnosis not present

## 2021-04-20 DIAGNOSIS — F411 Generalized anxiety disorder: Secondary | ICD-10-CM | POA: Diagnosis not present

## 2021-04-21 DIAGNOSIS — F329 Major depressive disorder, single episode, unspecified: Secondary | ICD-10-CM | POA: Diagnosis not present

## 2021-04-21 DIAGNOSIS — F32A Depression, unspecified: Secondary | ICD-10-CM | POA: Diagnosis not present

## 2021-04-21 DIAGNOSIS — Z87891 Personal history of nicotine dependence: Secondary | ICD-10-CM | POA: Diagnosis not present

## 2021-04-21 DIAGNOSIS — F419 Anxiety disorder, unspecified: Secondary | ICD-10-CM | POA: Diagnosis not present

## 2021-04-21 DIAGNOSIS — R45851 Suicidal ideations: Secondary | ICD-10-CM | POA: Diagnosis not present

## 2021-04-21 DIAGNOSIS — F332 Major depressive disorder, recurrent severe without psychotic features: Secondary | ICD-10-CM | POA: Diagnosis not present

## 2021-04-21 DIAGNOSIS — F418 Other specified anxiety disorders: Secondary | ICD-10-CM | POA: Diagnosis not present

## 2021-04-21 DIAGNOSIS — F411 Generalized anxiety disorder: Secondary | ICD-10-CM | POA: Diagnosis not present

## 2021-04-23 DIAGNOSIS — F419 Anxiety disorder, unspecified: Secondary | ICD-10-CM | POA: Diagnosis not present

## 2021-04-23 DIAGNOSIS — Z79899 Other long term (current) drug therapy: Secondary | ICD-10-CM | POA: Diagnosis not present

## 2021-04-23 DIAGNOSIS — E871 Hypo-osmolality and hyponatremia: Secondary | ICD-10-CM | POA: Diagnosis not present

## 2021-04-23 DIAGNOSIS — F32A Depression, unspecified: Secondary | ICD-10-CM | POA: Diagnosis not present

## 2021-04-23 DIAGNOSIS — R001 Bradycardia, unspecified: Secondary | ICD-10-CM | POA: Diagnosis not present

## 2021-04-23 DIAGNOSIS — Z87891 Personal history of nicotine dependence: Secondary | ICD-10-CM | POA: Diagnosis not present

## 2021-04-23 DIAGNOSIS — R4589 Other symptoms and signs involving emotional state: Secondary | ICD-10-CM | POA: Diagnosis not present

## 2021-04-23 DIAGNOSIS — I10 Essential (primary) hypertension: Secondary | ICD-10-CM | POA: Diagnosis not present

## 2021-04-23 DIAGNOSIS — Z20822 Contact with and (suspected) exposure to covid-19: Secondary | ICD-10-CM | POA: Diagnosis not present

## 2021-04-23 DIAGNOSIS — R45851 Suicidal ideations: Secondary | ICD-10-CM | POA: Diagnosis not present

## 2021-04-28 DIAGNOSIS — G4733 Obstructive sleep apnea (adult) (pediatric): Secondary | ICD-10-CM | POA: Diagnosis not present

## 2021-04-28 DIAGNOSIS — I10 Essential (primary) hypertension: Secondary | ICD-10-CM | POA: Diagnosis not present

## 2021-05-03 DIAGNOSIS — F332 Major depressive disorder, recurrent severe without psychotic features: Secondary | ICD-10-CM | POA: Diagnosis not present

## 2021-05-04 ENCOUNTER — Encounter (HOSPITAL_COMMUNITY): Payer: Self-pay | Admitting: Emergency Medicine

## 2021-05-04 ENCOUNTER — Emergency Department (HOSPITAL_COMMUNITY)
Admission: EM | Admit: 2021-05-04 | Discharge: 2021-05-05 | Disposition: A | Discharge status: Other Acute Inpatient Hospital | Payer: BC Managed Care – PPO | Source: Home / Self Care | Attending: Emergency Medicine | Admitting: Emergency Medicine

## 2021-05-04 ENCOUNTER — Other Ambulatory Visit: Payer: Self-pay

## 2021-05-04 DIAGNOSIS — I10 Essential (primary) hypertension: Secondary | ICD-10-CM | POA: Insufficient documentation

## 2021-05-04 DIAGNOSIS — S0990XA Unspecified injury of head, initial encounter: Secondary | ICD-10-CM | POA: Diagnosis not present

## 2021-05-04 DIAGNOSIS — Z87891 Personal history of nicotine dependence: Secondary | ICD-10-CM | POA: Insufficient documentation

## 2021-05-04 DIAGNOSIS — Z90710 Acquired absence of both cervix and uterus: Secondary | ICD-10-CM | POA: Diagnosis not present

## 2021-05-04 DIAGNOSIS — R0902 Hypoxemia: Secondary | ICD-10-CM | POA: Diagnosis not present

## 2021-05-04 DIAGNOSIS — F29 Unspecified psychosis not due to a substance or known physiological condition: Secondary | ICD-10-CM | POA: Diagnosis not present

## 2021-05-04 DIAGNOSIS — T1491XA Suicide attempt, initial encounter: Secondary | ICD-10-CM | POA: Insufficient documentation

## 2021-05-04 DIAGNOSIS — Z9049 Acquired absence of other specified parts of digestive tract: Secondary | ICD-10-CM | POA: Diagnosis not present

## 2021-05-04 DIAGNOSIS — T424X2A Poisoning by benzodiazepines, intentional self-harm, initial encounter: Secondary | ICD-10-CM | POA: Insufficient documentation

## 2021-05-04 DIAGNOSIS — E785 Hyperlipidemia, unspecified: Secondary | ICD-10-CM | POA: Diagnosis not present

## 2021-05-04 DIAGNOSIS — F319 Bipolar disorder, unspecified: Secondary | ICD-10-CM | POA: Diagnosis not present

## 2021-05-04 DIAGNOSIS — T50902A Poisoning by unspecified drugs, medicaments and biological substances, intentional self-harm, initial encounter: Secondary | ICD-10-CM

## 2021-05-04 DIAGNOSIS — T426X2A Poisoning by other antiepileptic and sedative-hypnotic drugs, intentional self-harm, initial encounter: Secondary | ICD-10-CM | POA: Diagnosis not present

## 2021-05-04 DIAGNOSIS — M1990 Unspecified osteoarthritis, unspecified site: Secondary | ICD-10-CM | POA: Diagnosis not present

## 2021-05-04 DIAGNOSIS — Z79899 Other long term (current) drug therapy: Secondary | ICD-10-CM | POA: Insufficient documentation

## 2021-05-04 DIAGNOSIS — Z20822 Contact with and (suspected) exposure to covid-19: Secondary | ICD-10-CM | POA: Insufficient documentation

## 2021-05-04 DIAGNOSIS — T43292A Poisoning by other antidepressants, intentional self-harm, initial encounter: Secondary | ICD-10-CM | POA: Insufficient documentation

## 2021-05-04 DIAGNOSIS — G4733 Obstructive sleep apnea (adult) (pediatric): Secondary | ICD-10-CM | POA: Diagnosis not present

## 2021-05-04 DIAGNOSIS — E119 Type 2 diabetes mellitus without complications: Secondary | ICD-10-CM | POA: Insufficient documentation

## 2021-05-04 DIAGNOSIS — Z86010 Personal history of colonic polyps: Secondary | ICD-10-CM | POA: Diagnosis not present

## 2021-05-04 DIAGNOSIS — F410 Panic disorder [episodic paroxysmal anxiety] without agoraphobia: Secondary | ICD-10-CM | POA: Diagnosis not present

## 2021-05-04 DIAGNOSIS — R45851 Suicidal ideations: Secondary | ICD-10-CM | POA: Insufficient documentation

## 2021-05-04 DIAGNOSIS — Z6841 Body Mass Index (BMI) 40.0 and over, adult: Secondary | ICD-10-CM | POA: Diagnosis not present

## 2021-05-04 DIAGNOSIS — K219 Gastro-esophageal reflux disease without esophagitis: Secondary | ICD-10-CM | POA: Diagnosis not present

## 2021-05-04 DIAGNOSIS — F411 Generalized anxiety disorder: Secondary | ICD-10-CM | POA: Diagnosis not present

## 2021-05-04 DIAGNOSIS — R404 Transient alteration of awareness: Secondary | ICD-10-CM | POA: Diagnosis not present

## 2021-05-04 LAB — COMPREHENSIVE METABOLIC PANEL
ALT: 23 U/L (ref 0–44)
AST: 20 U/L (ref 15–41)
Albumin: 3.4 g/dL — ABNORMAL LOW (ref 3.5–5.0)
Alkaline Phosphatase: 74 U/L (ref 38–126)
Anion gap: 6 (ref 5–15)
BUN: 9 mg/dL (ref 6–20)
CO2: 25 mmol/L (ref 22–32)
Calcium: 9.5 mg/dL (ref 8.9–10.3)
Chloride: 107 mmol/L (ref 98–111)
Creatinine, Ser: 0.8 mg/dL (ref 0.44–1.00)
GFR, Estimated: >60 mL/min (ref >60)
Glucose, Bld: 101 mg/dL — ABNORMAL HIGH (ref 70–99)
Potassium: 3.8 mmol/L (ref 3.5–5.1)
Sodium: 138 mmol/L (ref 135–145)
Total Bilirubin: 0.6 mg/dL (ref 0.3–1.2)
Total Protein: 6.5 g/dL (ref 6.5–8.1)

## 2021-05-04 LAB — CBC WITH DIFFERENTIAL/PLATELET
Abs Immature Granulocytes: 0.03 10*3/uL (ref 0.00–0.07)
Basophils Absolute: 0 10*3/uL (ref 0.0–0.1)
Basophils Relative: 0 %
Eosinophils Absolute: 0.1 10*3/uL (ref 0.0–0.5)
Eosinophils Relative: 1 %
HCT: 42.3 % (ref 36.0–46.0)
Hemoglobin: 13.6 g/dL (ref 12.0–15.0)
Immature Granulocytes: 0 %
Lymphocytes Relative: 14 %
Lymphs Abs: 1.3 10*3/uL (ref 0.7–4.0)
MCH: 28.8 pg (ref 26.0–34.0)
MCHC: 32.2 g/dL (ref 30.0–36.0)
MCV: 89.4 fL (ref 80.0–100.0)
Monocytes Absolute: 0.9 10*3/uL (ref 0.1–1.0)
Monocytes Relative: 10 %
Neutro Abs: 6.6 10*3/uL (ref 1.7–7.7)
Neutrophils Relative %: 75 %
Platelets: 280 10*3/uL (ref 150–400)
RBC: 4.73 MIL/uL (ref 3.87–5.11)
RDW: 13.4 % (ref 11.5–15.5)
WBC: 9 10*3/uL (ref 4.0–10.5)
nRBC: 0 % (ref 0.0–0.2)

## 2021-05-04 LAB — ACETAMINOPHEN LEVEL
Acetaminophen (Tylenol), Serum: <10 ug/mL — ABNORMAL LOW (ref 10–30)
Acetaminophen (Tylenol), Serum: <10 ug/mL — ABNORMAL LOW (ref 10–30)

## 2021-05-04 LAB — RESP PANEL BY RT-PCR (FLU A&B, COVID) ARPGX2
Influenza A by PCR: NEGATIVE
Influenza B by PCR: NEGATIVE
SARS Coronavirus 2 by RT PCR: NEGATIVE

## 2021-05-04 LAB — RAPID URINE DRUG SCREEN, HOSP PERFORMED
Amphetamines: NOT DETECTED
Barbiturates: NOT DETECTED
Benzodiazepines: POSITIVE — AB
Cocaine: NOT DETECTED
Opiates: NOT DETECTED
Tetrahydrocannabinol: NOT DETECTED

## 2021-05-04 LAB — MAGNESIUM: Magnesium: 2.2 mg/dL (ref 1.7–2.4)

## 2021-05-04 LAB — SALICYLATE LEVEL: Salicylate Lvl: <7 mg/dL — ABNORMAL LOW (ref 7.0–30.0)

## 2021-05-04 MED ORDER — LISINOPRIL 10 MG PO TABS: 10.0000 mg | ORAL_TABLET | Freq: Every day | ORAL | Status: DC

## 2021-05-04 MED ORDER — VERAPAMIL HCL ER 180 MG PO TBCR
180.0000 mg | EXTENDED_RELEASE_TABLET | Freq: Every day | ORAL | Status: DC
  Administered 2021-05-05: 180 mg via ORAL
  Filled 2021-05-04: qty 1

## 2021-05-04 MED ORDER — ACETAMINOPHEN 325 MG PO TABS: 650.0000 mg | ORAL_TABLET | Freq: Once | ORAL | Status: DC

## 2021-05-04 MED ORDER — SODIUM CHLORIDE 0.9 % IV BOLUS
1000.0000 mL | Freq: Once | INTRAVENOUS | Status: AC
  Administered 2021-05-04: 1000 mL via INTRAVENOUS

## 2021-05-04 NOTE — ED Notes (Signed)
Pt changed into burgundy scrubs.  Belongings collected and placed in cabinet for rooms 5-8.  1 gray shirt, 1 pair of gray bottoms and 1 gray bra.

## 2021-05-04 NOTE — ED Triage Notes (Signed)
BIBA Per EMS:  Pt coming from home with an overdose on ambien (30 pills), xanax(3), and prozac(3). Pt stated she took pill around 1330. Pt daughter heard her fall and found her on floor. Pt has written SI note to children  500cc fluids given by EMS. 18 G L forearm.  Brady - 40-50 HR  100/80  98% RA

## 2021-05-04 NOTE — ED Notes (Signed)
Per Tera Mater, pt has placement at [facility]. Called nursing staff at [facility], they will call back with update concerning what time they will have a bed ready for pt.

## 2021-05-04 NOTE — ED Provider Notes (Signed)
[place] DEPT Provider Note   CSN: [id number] Arrival date & time: 05/04/21  1553     History Chief Complaint  Patient presents with   Ingestion    Alexis Wall is a 55 y.o. female with past medical history significant for bipolar disorder, diabetes, hypertension, sleep apnea, anemia.  HPI Patient presenting with drug overdose. Patient is sleepy on arrival and unable to provide much history.  She admits to overdosing on Ambien, Xanax and Prozac in an attempt to end her life.  She had suicide notes written to her children.  Triage note: Patient coming from home with an overdose on ambien (30 pills), xanax(3), and prozac(3). Pt stated she took pill around 1330. Pt daughter heard her fall and found her on floor. Pt has written SI note to children 500cc fluids given by EMS. 18 G L forearm. Brady - 40-50 HR 100/80 98% RA  Additional history obtained by patient's spouse.  He states patient has severe history of depression and anxiety.  She is followed by [facility] and in the past has had outpatient ECT in 2008.  Spouse states that patient has had worsening depression over the last couple of months.  She recently saw psychiatry who changed medications and are considering outpatient ECT again.  Patient was alone the bathroom with the door locked.  Her daughter heard a thump and tried to break down the door.  She called EMS.  On EMS arrival patient was sitting up leaning against the wall.  She was conscious but sleepy.  Patient denies being in any pain right now.     Past Medical History:  Diagnosis Date   Anemia    Arthritis    pains in knee   Bipolar disorder ([place])    per Dr D.Patterson's office note 07/08/2007   Depression    [facility]   Diabetes mellitus    GESTATIONAL-not now   GERD (gastroesophageal reflux disease)    Hypertension    Morbid obesity ([place])    Polyp of colon    Sleep apnea    Wears CPAP    Patient Active Problem List    Diagnosis Date Noted   Suicidal ideation    Suicide attempt ([place])    Chest pain 02/11/2021   Chronic intermittent hypoxia with obstructive sleep apnea 03/15/2020   Severe obstructive sleep apnea-hypopnea syndrome 02/10/2020   Class 3 severe obesity due to excess calories with serious comorbidity and body mass index (BMI) of 45.0 to 49.9 in adult ([place]) 02/10/2020   Hypertension    GERD (gastroesophageal reflux disease)    Bipolar 1 disorder, depressed ([place])    Sleep apnea    Anemia    Acute on chronic cholecystitis s/p lap cholecystectomy 08/27/2018 08/27/2018   Menopausal hot flushes 08/26/2013   Morbid obesity, Weight - 239, BMI - 41.07. 05/02/2012   COLONIC POLYPS, HYPERPLASTIC 03/01/2008   DEPRESSION 03/01/2008   HEMORRHOIDS 03/01/2008    Past Surgical History:  Procedure Laterality Date   ABDOMINAL HYSTERECTOMY  03/21/2010   TAH   BREATH TEK H PYLORI  08/05/2012   Procedure: BREATH TEK H PYLORI;  Surgeon: Pedro Earls, MD;  Location: [facility];  Service: General;  Laterality: N/A;  [place]  01/26/1999, 09/30/2002   X2.Marland Kitchen WITH BTSP   CHOLECYSTECTOMY N/A 08/27/2018   Procedure: LAPAROSCOPIC CHOLECYSTECTOMY WITH INTRAOPERATIVE CHOLANGIOGRAM;  Surgeon: Excell Seltzer, MD;  Location: [facility];  Service: General;  Laterality: N/A;   ENDOMETRIAL ABLATION  08/25/2009   HER OPTION      OB History     Gravida  3   Para  2   Term  2   Preterm      AB  1   Living  2      SAB  1   IAB      Ectopic      Multiple      Live Births  2           Family History  Problem Relation Age of Onset   Hypertension Father    Obesity Father    Cancer Maternal Grandfather        colon   Sleep apnea Mother    Heart disease Paternal Grandfather    Colon cancer Neg Hx     Social History   Tobacco Use   Smoking status: Former    Pack years: 0.00    Types: Cigarettes    Quit date: 05/03/1979    Years since quitting: 42.0   Smokeless tobacco: Never   Vaping Use   Vaping Use: Never used  Substance Use Topics   Alcohol use: Yes    Alcohol/week: 1.0 standard drink    Types: 1 Glasses of wine per week    Comment: occasional glass of wine once per week   Drug use: No    Home Medications Prior to Admission medications   Medication Sig Start Date End Date Taking? Authorizing Provider  ALPRAZolam Duanne Moron) 0.5 MG tablet Take 0.5 mg by mouth 3 (three) times daily.   Yes [provider]  FLUoxetine (PROZAC) 20 MG capsule Take 60 mg by mouth daily. 02/08/21  Yes [provider]  lisinopril (ZESTRIL) 10 MG tablet Take 10 mg by mouth daily.   Yes [provider]  omeprazole (PRILOSEC) 20 MG capsule Take 20 mg by mouth at bedtime.    Yes [provider]  verapamil (VERELAN PM) 180 MG 24 hr capsule Take 180 mg by mouth at bedtime. 06/20/18  Yes [provider]  zolpidem (AMBIEN) 10 MG tablet Take 10 mg by mouth at bedtime. Takes an additional 5 mg if needed.   Yes [provider]  clonazePAM (KLONOPIN) 1 MG tablet Take 1 mg by mouth 2 (two) times daily.    [provider]    Allergies    Penicillins  Review of Systems   Review of Systems All other systems are reviewed and are negative for acute change except as noted in the HPI.  Physical Exam Updated Vital Signs BP (!) 158/88   Pulse 63   Temp 97.6 F (36.4 C)   Resp 18   SpO2 97%   Physical Exam Vitals and nursing note reviewed.  Constitutional:      General: She is not in acute distress.    Appearance: She is not ill-appearing.  HENT:     Head: Normocephalic and atraumatic.     Comments: No tenderness to palpation of skull. No deformities or crepitus noted. No open wounds, abrasions or lacerations.      Right Ear: Tympanic membrane and external ear normal.     Left Ear: Tympanic membrane and external ear normal.     Nose: Nose normal.     Mouth/Throat:     Mouth: Mucous membranes are moist.     Pharynx:  Oropharynx is clear.  Eyes:     General: No scleral icterus.       Right eye: No discharge.  Left eye: No discharge.     Extraocular Movements: Extraocular movements intact.     Conjunctiva/sclera: Conjunctivae normal.     Pupils: Pupils are equal, round, and reactive to light.  Neck:     Vascular: No JVD.     Comments: Full ROM intact without spinous process TTP. No bony stepoffs or deformities, no paraspinous muscle TTP or muscle spasms. No rigidity or meningeal signs. No bruising, erythema, or swelling.   Cardiovascular:     Rate and Rhythm: Normal rate and regular rhythm.     Pulses: Normal pulses.          Radial pulses are 2+ on the right side and 2+ on the left side.     Heart sounds: Normal heart sounds.  Pulmonary:     Comments: Lungs clear to auscultation in all fields. Symmetric chest rise. No wheezing, rales, or rhonchi. Abdominal:     Comments: Abdomen is soft, non-distended, and non-tender in all quadrants. No rigidity, no guarding. No peritoneal signs.  Musculoskeletal:        General: Normal range of motion.     Cervical back: Normal range of motion.  Skin:    General: Skin is warm and dry.     Capillary Refill: Capillary refill takes less than 2 seconds.  Neurological:     Mental Status: She is oriented to person, place, and time.     GCS: GCS eye subscore is 4. GCS verbal subscore is 5. GCS motor subscore is 6.     Comments: Fluent speech, no facial droop.  Psychiatric:        Mood and Affect: Affect is flat.        Speech: Speech normal.        Behavior: Behavior is slowed. Behavior is cooperative.        Thought Content: Thought content includes suicidal ideation. Thought content does not include homicidal ideation. Thought content includes suicidal plan. Thought content does not include homicidal plan.    ED Results / Procedures / Treatments   Labs (all labs ordered are listed, but only abnormal results are displayed) Labs Reviewed  COMPREHENSIVE  METABOLIC PANEL - Abnormal; Notable for the following components:      Result Value   Glucose, Bld 101 (*)    Albumin 3.4 (*)    All other components within normal limits  ACETAMINOPHEN LEVEL - Abnormal; Notable for the following components:   Acetaminophen (Tylenol), Serum <10 (*)    All other components within normal limits  SALICYLATE LEVEL - Abnormal; Notable for the following components:   Salicylate Lvl <1.0 (*)    All other components within normal limits  RAPID URINE DRUG SCREEN, HOSP PERFORMED - Abnormal; Notable for the following components:   Benzodiazepines POSITIVE (*)    All other components within normal limits  ACETAMINOPHEN LEVEL - Abnormal; Notable for the following components:   Acetaminophen (Tylenol), Serum <10 (*)    All other components within normal limits  RESP PANEL BY RT-PCR (FLU A&B, COVID) ARPGX2  CBC WITH DIFFERENTIAL/PLATELET  MAGNESIUM    EKG EKG Interpretation  Date/Time:  Thursday May 04 2021 16:34:51 EDT Ventricular Rate:  52 PR Interval:  153 QRS Duration: 93 QT Interval:  446 QTC Calculation: 415 R Axis:   60 Text Interpretation: Sinus rhythm Atrial premature complexes Confirmed by Quintella Reichert [phone]) on 05/04/2021 4:42:31 PM  Radiology No results found.  Procedures Procedures   Medications Ordered in ED Medications  lisinopril (ZESTRIL) tablet 10 mg (  has no administration in time range)  verapamil (CALAN-SR) CR tablet 180 mg (has no administration in time range)  sodium chloride 0.9 % bolus 1,000 mL (1,000 mLs Intravenous Bolus 05/04/21 1642)    ED Course  I have reviewed the triage vital signs and the nursing notes.  Pertinent labs & imaging results that were available during my care of the patient were reviewed by me and considered in my medical decision making (see chart for details).    MDM Rules/Calculators/A&P                          History provided by EMS with additional history obtained from chart review.     Patient presenting after intentional overdose of home medications including Ambien, Prozac and Xanax.  On arrival she is sleepy yet will wake up when prompted with speech.  She is alert and oriented.  Her vital signs are stable.  No signs of head neck or back injury on my exam.  She is moving all extremities without signs of injury. Contacted poison control on patient's arrival. Prozac to cause can cause Qtc prolongation, cns depression, Tachycardia, htn.  Ambien-cns depression, hallucination, hypotension. QTC is 480>greater imize K to 4+ and magnesium at least 2. 4 hour tylenol level would need to be ordered at 8 pm. Recommend fluids and benzos, obs for at least 6 hours until 10 PM.  Patient given a liter of fluids.  IVC orders taken out as patient has overdosed on her psych medications.  Husband at the bedside and is agreeable with plan of care.  EKG does not show prolonged QTC.  Lab work is overall unremarkable.  No electrolyte derangement, COVID test is negative.  4-hour Tylenol level is negative.  UDS is positive for benzos.   Patient medically cleared at 10 PM.  She is still sleepy although will easily wake up to speech.  She is alert and oriented able to carry on conversation.  Patient had TTS evaluation and they are recommending inpatient treatment.  She is stable to be transferred to behavioral health Hospital. Discussed HPI, physical exam and plan of care for this patient with attending Dr. Ralene Bathe. The attending physician evaluated this patient as part of a shared visit and agrees with plan of care.    Portions of this note were generated with [employer]. Dictation errors may occur despite best attempts at proofreading.   Final Clinical Impression(s) / ED Diagnoses Final diagnoses:  Intentional drug overdose, initial encounter [facility])    Rx / [facility] Orders ED Discharge Orders     None        Lewanda Rife 05/04/21 [id number]    Quintella Reichert, MD 05/05/21  1807

## 2021-05-04 NOTE — ED Notes (Signed)
Pt wanded by security. 

## 2021-05-04 NOTE — BH Assessment (Signed)
Comprehensive Clinical Assessment (CCA) Note  05/04/2021 Alexis Wall [id number]  Chief Complaint:  Chief Complaint  Patient presents with   Ingestion   Visit Diagnosis:  Bipolar disorder, depressed, severe Suicidal ideation Suicide attempt  Disposition: Per [name], PA pt meets inpatient criteria. Pt RN and EDP informed of disposition via epic secure chat. [facility] AC notified of disposition and pt has been tentatively accepted pending medical clearance.   [place] ED from 05/04/2021 in [place] DEPT ED to Hosp-Admission (Discharged) from 02/11/2021 in [facility]  C-SSRS RISK CATEGORY High Risk No Risk       The patient demonstrates the following risk factors for suicide: Chronic risk factors for suicide include: psychiatric disorder of bipolar disorder, medical illness diabetes, obesity, chronic pain, and completed suicide in a family member. Acute risk factors for suicide include: loss (financial, interpersonal, professional). Protective factors for this patient include: responsibility to others (children, family). Considering these factors, the overall suicide risk at this point appears to be high. Patient is not appropriate for outpatient follow up.   Alexis Wall is a 55 yo female transported by EMS after suicide attempt. Pt reports taking 30 ambien, prozac, and xanax tablets. Pt admits that she planned out the attempt and wrote suicide notes to all family members. Pt states "I am sick and tired of the pain and suffering--I just wanted to end it all". Pt states her mental health decline started in march and escalated significantly the past 2 days. Pts father committed suicide 2 years ago. Pt has a history of bipolar disorder, depression, and anxiety and is being treated by psychiatry at [facility] and reports that she recently had an appt where meds were changed. Pt reports that she has had ECT tx in the past (2008) at [facility] and has plans to  have another round of ECT at the end of June or July. Pt has psychiatric appt at [facility] on 6/27. Pt is currently taking Prozac and Xanax and denies any previous suicide attempts or self injurious behaviors.   Pt denies any HI or AVH. Pt denies any paranoid ideation. Pt reports that she drinks etoh socially but nothing regular. Pt denies any other drug use. Pt currently lives at home with her husband and daughter (83). Pt states she has a son that just graduated from [employer] and that her daughter will be starting college in the fall.    CCA Screening, Triage and Referral (STR)  Patient Reported Information How did you hear about [place]? No data recorded Referral name: EMS to [facility]  Referral phone number: No data recorded  Whom do you see for routine medical problems? No data recorded Practice/Facility Name: No data recorded Practice/Facility Phone Number: No data recorded Name of Contact: No data recorded Contact Number: No data recorded Contact Fax Number: No data recorded Prescriber Name: No data recorded Prescriber Address (if known): No data recorded  What Is the Reason for Your Visit/Call Today? Suicide attempt/bipolar disorder/depression  How Long Has This Been Causing You Problems? <Week  What Do You Feel Would Help You the Most Today? Treatment for Depression or other mood problem   Have You Recently Been in Any Inpatient Treatment (Hospital/Detox/Crisis Center/28-Day Program)? No  Name/Location of Program/Hospital:No data recorded How Long Were You There? No data recorded When Were You Discharged? No data recorded  Have You Ever Received Services From [facility] Before? Yes  Who Do You See at [facility]? No data recorded  Have  You Recently Had Any Thoughts About Hurting Yourself? Yes  Are You Planning to Commit Suicide/Harm Yourself At This time? Yes   Have you Recently Had Thoughts About Hurting Someone [name]? No  Explanation: No data recorded  Have You Used Any  Alcohol or Drugs in the Past 24 Hours? No  How Long Ago Did You Use Drugs or Alcohol? No data recorded What Did You Use and How Much? No data recorded  Do You Currently Have a Therapist/Psychiatrist? Yes  Name of Therapist/Psychiatrist: Dr. Janalyn Rouse (Resident) at [facility] Recently Discharged From Any Office Practice or Programs? No  Explanation of Discharge From Practice/Program: No data recorded    CCA Screening Triage Referral Assessment Type of Contact: Tele-Assessment  Is this Initial or Reassessment? Initial Assessment  Date Telepsych consult ordered in [facility]:  05/04/21  Time Telepsych consult ordered in [facility]:  2142   Patient Reported Information Reviewed? No data recorded Patient Left Without Being Seen? No data recorded Reason for Not Completing Assessment: No data recorded  Collateral Involvement: n/a   Does Patient Have a Court Appointed Legal Guardian? No data recorded Name and Contact of Legal Guardian: No data recorded If Minor and Not Living with Parent(s), Who has Custody? No data recorded Is CPS involved or ever been involved? Never  Is APS involved or ever been involved? Never   Patient Determined To Be At Risk for Harm To Self or Others Based on Review of Patient Reported Information or Presenting Complaint? Yes, for Self-Harm  Method: No data recorded Availability of Means: No data recorded Intent: No data recorded Notification Required: No data recorded Additional Information for Danger to Others Potential: No data recorded Additional Comments for Danger to Others Potential: No data recorded Are There Guns or Other Weapons in Your Home? No data recorded Types of Guns/Weapons: No data recorded Are These Weapons Safely Secured?                            No data recorded Who Could Verify You Are Able To Have These Secured: No data recorded Do You Have any Outstanding Charges, Pending Court Dates, Parole/Probation? No data  recorded Contacted To Inform of Risk of Harm To Self or Others: No data recorded  Location of Assessment: [facility] ED   Does Patient Present under Involuntary Commitment? No  IVC Papers Initial File Date: No data recorded  [place] of Residence: [place]   Patient Currently Receiving the Following Services: Medication Management   Determination of Need: Emergent (2 hours)   Options For Referral: Inpatient Hospitalization     CCA Biopsychosocial Intake/Chief Complaint:  Beaux is a 55 yo female transported by EMS after suicide attempt. Pt reports taking 30 ambien, prozac, and xanax tablets. Pt admits that she planned out the attempt and wrote suicide notes to all family members. Pt states "I am sick and tired of the pain and suffering--I just wanted to end it all".  Pt states her mental health decline started in march and escalated significantly the past 2 days.  Pts father committed suicide 2 years ago.  Pt has a history of bipolar disorder, depression, and anxiety and is being treated by psychiatry at [facility] and reports that she recently had an appt where meds were changed.  Pt reports that she has had ECT tx in the past (2008) at [facility] and has plans to have another round of ECT at the end of June or  July. Pt has psychiatric appt at [facility] on 6/27.  Pt is currently taking Prozac and Xanax and denies any previous suicide attempts or self injurious behaviors. Pt denies any HI or AVH.  Pt denies any paranoid ideation. Pt reports that she drinks etoh socially but nothing regular. Pt denies any other drug use.  Pt currently lives at home with her husband and daughter (78). Pt states she has a son that just graduated from [employer] and that her daughter will be starting college in the fall.  Current Symptoms/Problems: depression, suicidal thoughts   Patient Reported Schizophrenia/Schizoaffective Diagnosis in Past: No   Strengths: strong family supports  Preferences: psychiatric  stabilization  Abilities: No data recorded  Type of Services Patient Feels are Needed: psychiatric stabilization   Initial Clinical Notes/Concerns: No data recorded  Mental Health Symptoms Depression:   Change in energy/activity; Difficulty Concentrating; Fatigue; Hopelessness; Increase/decrease in appetite; Sleep (too much or little); Tearfulness; Weight gain/loss   Duration of Depressive symptoms:  Greater than two weeks   Mania:   Racing thoughts   Anxiety:    Worrying; Sleep; Restlessness; Fatigue; Difficulty concentrating   Psychosis:   None   Duration of Psychotic symptoms: No data recorded  Trauma:   None   Obsessions:   None   Compulsions:   None   Inattention:   None   Hyperactivity/Impulsivity:   None   Oppositional/Defiant Behaviors:   None   Emotional Irregularity:   None   Other Mood/Personality Symptoms:  No data recorded   Mental Status Exam Appearance and self-care  Stature:   Average   Weight:   Obese   Clothing:   Neat/clean   Grooming:   Normal   Cosmetic use:   None   Posture/gait:   Normal   Motor activity:   Not Remarkable   Sensorium  Attention:   Normal   Concentration:   Normal   Orientation:   X5   Recall/memory:   Normal   Affect and Mood  Affect:   Anxious; Depressed   Mood:   Depressed; Anxious   Relating  Eye contact:   Normal   Facial expression:   Depressed; Sad   Attitude toward examiner:   Cooperative   Thought and Language  Speech flow:  Clear and Coherent   Thought content:   Appropriate to Mood and Circumstances   Preoccupation:   None   Hallucinations:   None   Organization:  No data recorded  [employer] of Knowledge:   Good   Intelligence:   Above Average   Abstraction:   Normal   Judgement:   Dangerous   Reality Testing:   Variable; Distorted   Insight:   Gaps   Decision Making:   Impulsive   Social Functioning  Social  Maturity:   Impulsive; Irresponsible   Social Judgement:   Heedless; Impropriety   Stress  Stressors:   Museum/gallery curator; Grief/losses   Coping Ability:   Programme researcher, broadcasting/film/video Deficits:   Self-control   Supports:   Family; Church     Religion: Religion/Spirituality Are You A Religious Person?: Yes  Leisure/Recreation: Leisure / Recreation Do You Have Hobbies?: Yes Leisure and Hobbies: reading, tv, walking, friends, children, hanging out with husband, movies  Exercise/Diet: Exercise/Diet Do You Exercise?: Yes What Type of Exercise Do You Do?: Run/Walk Have You Gained or Lost A Significant Amount of Weight in the Past Six Months?: Yes-Lost Number of Pounds Lost?: 30 Do You Follow a Special Diet?:  No Do You Have Any Trouble Sleeping?: Yes Explanation of Sleeping Difficulties: insomnia at times   CCA Employment/Education Employment/Work Situation: Employment / Work Situation Employment Situation: Unemployed Where is Patient Currently Employed?: 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[employer]; worked for [employer], Full time mom Has Patient ever Been in the [employer]?: No  Education: Education Is Patient Currently Attending School?: No Did Teacher, adult education From [employer]?: Yes Did Physicist, medical?: Yes Did Avinger?: No Did You Have An Individualized Education Program (IIEP): No Did You Have Any Difficulty At [employer]?: No Patient's Education Has Been Impacted by Current Illness: No   CCA Family/Childhood History Family and Relationship History: Family history Marital status: Married Does patient have children?: Yes How many children?: 2  Childhood History:  Childhood History By whom was/is the patient raised?: Both parents Additional childhood history information: pt moved with mother from [place] to [place] when parents were divorced Description of patient's relationship with caregiver when they were a child: stable relationship Does patient have  siblings?: No Did patient suffer any verbal/emotional/physical/sexual abuse as a child?: No Did patient suffer from severe childhood neglect?: No Has patient ever been sexually abused/assaulted/raped as an adolescent or adult?: No Was the patient ever a victim of a crime or a disaster?: No Witnessed domestic violence?: Yes Has patient been affected by domestic violence as an adult?: No Description of domestic violence: pt witnessed single incident when she was a child between her parents  Child/Adolescent Assessment:     CCA Substance Use Alcohol/Drug Use: Alcohol / Drug Use Pain Medications: see MAR Prescriptions: see MAR Over the Counter: see MAR History of alcohol / drug use?: Yes Substance #1 Name of Substance 1: etoh--social use      ASAM's:  Six Dimensions of Multidimensional Assessment  Dimension 1:  Acute Intoxication and/or Withdrawal Potential:   Dimension 1:  Description of individual's past and current experiences of substance use and withdrawal: etoh  Dimension 2:  Biomedical Conditions and Complications:      Dimension 3:  Emotional, Behavioral, or Cognitive Conditions and Complications:     Dimension 4:  Readiness to Change:     Dimension 5:  Relapse, Continued use, or Continued Problem Potential:     Dimension 6:  Recovery/Living Environment:     ASAM Severity Score: ASAM's Severity Rating Score: 0  ASAM Recommended Level of Treatment:     Substance use Disorder (SUD)    Recommendations for Services/Supports/Treatments: Recommendations for Services/Supports/Treatments Recommendations For Services/Supports/Treatments: Inpatient Hospitalization  DSM5 Diagnoses: Patient Active Problem List   Diagnosis Date Noted   Suicidal ideation    Suicide attempt ([place])    Chest pain 02/11/2021   Chronic intermittent hypoxia with obstructive sleep apnea 03/15/2020   Severe obstructive sleep apnea-hypopnea syndrome 02/10/2020   Class 3 severe obesity due to excess  calories with serious comorbidity and body mass index (BMI) of 45.0 to 49.9 in adult ([place]) 02/10/2020   Hypertension    GERD (gastroesophageal reflux disease)    Bipolar 1 disorder, depressed ([place])    Sleep apnea    Anemia    Acute on chronic cholecystitis s/p lap cholecystectomy 08/27/2018 08/27/2018   Menopausal hot flushes 08/26/2013   Morbid obesity, Weight - 239, BMI - 41.07. 05/02/2012   COLONIC POLYPS, HYPERPLASTIC 03/01/2008   DEPRESSION 03/01/2008   HEMORRHOIDS 03/01/2008   Referrals to Alternative Service(s): Referred to Alternative Service(s):   Place:   Date:   Time:    Referred to  Alternative Service(s):   Place:   Date:   Time:    Referred to Alternative Service(s):   Place:   Date:   Time:    Referred to Alternative Service(s):   Place:   Date:   Time:     Rachel Bo Cristal Howatt, LCSW

## 2021-05-04 NOTE — ED Notes (Signed)
Pt ambulatory to the BR with standby assistance.

## 2021-05-04 NOTE — Progress Notes (Signed)
Pt accepted to [facility] rm 303-2, PENDING medical clearance.   Patient meets inpatient criteria per [name], NP  Dr. Nelda Marseille is the attending provider.    Call report to [phone]    Rafael Bihari, RN  @ [facility] ED notified via secure chat.      Signed:  Durenda Hurt, MSW, [place], LCASA 05/04/2021 11:10 PM

## 2021-05-04 NOTE — ED Notes (Signed)
Medications given to husband to take home.

## 2021-05-05 ENCOUNTER — Encounter (HOSPITAL_COMMUNITY): Payer: Self-pay | Admitting: Student

## 2021-05-05 ENCOUNTER — Inpatient Hospital Stay (HOSPITAL_COMMUNITY)
Admission: RE | Admit: 2021-05-05 | Discharge: 2021-05-15 | DRG: 885 | Disposition: A | Discharge status: Home / Self Care | Payer: BC Managed Care – PPO | Source: Intra-hospital | Attending: Emergency Medicine | Admitting: Emergency Medicine

## 2021-05-05 DIAGNOSIS — K219 Gastro-esophageal reflux disease without esophagitis: Secondary | ICD-10-CM | POA: Diagnosis present

## 2021-05-05 DIAGNOSIS — I10 Essential (primary) hypertension: Secondary | ICD-10-CM | POA: Diagnosis present

## 2021-05-05 DIAGNOSIS — Z87891 Personal history of nicotine dependence: Secondary | ICD-10-CM

## 2021-05-05 DIAGNOSIS — Z9049 Acquired absence of other specified parts of digestive tract: Secondary | ICD-10-CM | POA: Diagnosis not present

## 2021-05-05 DIAGNOSIS — E119 Type 2 diabetes mellitus without complications: Secondary | ICD-10-CM | POA: Diagnosis present

## 2021-05-05 DIAGNOSIS — Z20822 Contact with and (suspected) exposure to covid-19: Secondary | ICD-10-CM | POA: Diagnosis present

## 2021-05-05 DIAGNOSIS — T426X2A Poisoning by other antiepileptic and sedative-hypnotic drugs, intentional self-harm, initial encounter: Secondary | ICD-10-CM | POA: Diagnosis present

## 2021-05-05 DIAGNOSIS — Z90710 Acquired absence of both cervix and uterus: Secondary | ICD-10-CM

## 2021-05-05 DIAGNOSIS — E785 Hyperlipidemia, unspecified: Secondary | ICD-10-CM | POA: Diagnosis present

## 2021-05-05 DIAGNOSIS — F411 Generalized anxiety disorder: Secondary | ICD-10-CM | POA: Diagnosis not present

## 2021-05-05 DIAGNOSIS — R45851 Suicidal ideations: Secondary | ICD-10-CM | POA: Diagnosis present

## 2021-05-05 DIAGNOSIS — F410 Panic disorder [episodic paroxysmal anxiety] without agoraphobia: Secondary | ICD-10-CM | POA: Diagnosis present

## 2021-05-05 DIAGNOSIS — F319 Bipolar disorder, unspecified: Principal | ICD-10-CM | POA: Diagnosis present

## 2021-05-05 DIAGNOSIS — Z79899 Other long term (current) drug therapy: Secondary | ICD-10-CM | POA: Diagnosis not present

## 2021-05-05 DIAGNOSIS — Z6841 Body Mass Index (BMI) 40.0 and over, adult: Secondary | ICD-10-CM

## 2021-05-05 DIAGNOSIS — Z86010 Personal history of colonic polyps: Secondary | ICD-10-CM | POA: Diagnosis not present

## 2021-05-05 DIAGNOSIS — M1990 Unspecified osteoarthritis, unspecified site: Secondary | ICD-10-CM | POA: Diagnosis present

## 2021-05-05 DIAGNOSIS — G4733 Obstructive sleep apnea (adult) (pediatric): Secondary | ICD-10-CM | POA: Diagnosis present

## 2021-05-05 DIAGNOSIS — Z8249 Family history of ischemic heart disease and other diseases of the circulatory system: Secondary | ICD-10-CM | POA: Diagnosis not present

## 2021-05-05 DIAGNOSIS — F332 Major depressive disorder, recurrent severe without psychotic features: Secondary | ICD-10-CM | POA: Diagnosis not present

## 2021-05-05 DIAGNOSIS — Z818 Family history of other mental and behavioral disorders: Secondary | ICD-10-CM | POA: Diagnosis not present

## 2021-05-05 DIAGNOSIS — Z880 Allergy status to penicillin: Secondary | ICD-10-CM | POA: Diagnosis not present

## 2021-05-05 LAB — LIPID PANEL
Cholesterol: 118 mg/dL (ref 0–200)
HDL: 39 mg/dL — ABNORMAL LOW (ref >40)
LDL Cholesterol: 57 mg/dL (ref 0–99)
Total CHOL/HDL Ratio: 3 RATIO
Triglycerides: 109 mg/dL (ref <150)
VLDL: 22 mg/dL (ref 0–40)

## 2021-05-05 LAB — TSH: TSH: 2.115 u[IU]/mL (ref 0.350–4.500)

## 2021-05-05 MED ORDER — LORAZEPAM 1 MG PO TABS: 1.0000 mg | ORAL_TABLET | Freq: Four times a day (QID) | ORAL | Status: DC | PRN

## 2021-05-05 MED ORDER — LISINOPRIL 10 MG PO TABS
10.0000 mg | ORAL_TABLET | Freq: Every day | ORAL | Status: DC
  Administered 2021-05-06 – 2021-05-15 (×10): 10 mg via ORAL
  Filled 2021-05-05: qty 1
  Filled 2021-05-05 (×2): qty 2
  Filled 2021-05-05 (×8): qty 1
  Filled 2021-05-05: qty 2
  Filled 2021-05-05 (×4): qty 1

## 2021-05-05 MED ORDER — HYDROXYZINE HCL 25 MG PO TABS
25.0000 mg | ORAL_TABLET | Freq: Four times a day (QID) | ORAL | Status: AC | PRN
  Administered 2021-05-05 – 2021-05-07 (×2): 25 mg via ORAL
  Filled 2021-05-05 (×2): qty 1

## 2021-05-05 MED ORDER — ADULT MULTIVITAMIN W/MINERALS CH
1.0000 | ORAL_TABLET | Freq: Every day | ORAL | Status: DC
  Administered 2021-05-05 – 2021-05-15 (×11): 1 via ORAL
  Filled 2021-05-05 (×14): qty 1

## 2021-05-05 MED ORDER — PANTOPRAZOLE SODIUM 40 MG PO TBEC
40.0000 mg | DELAYED_RELEASE_TABLET | Freq: Every day | ORAL | Status: DC
  Administered 2021-05-05 – 2021-05-14 (×10): 40 mg via ORAL
  Filled 2021-05-05 (×12): qty 1

## 2021-05-05 MED ORDER — ENSURE ENLIVE PO LIQD
237.0000 mL | Freq: Two times a day (BID) | ORAL | Status: DC
  Administered 2021-05-05 – 2021-05-15 (×10): 237 mL via ORAL
  Filled 2021-05-05 (×25): qty 237

## 2021-05-05 MED ORDER — ALUM & MAG HYDROXIDE-SIMETH 200-200-20 MG/5ML PO SUSP: 30.0000 mL | ORAL | Status: DC | PRN

## 2021-05-05 MED ORDER — ONDANSETRON 4 MG PO TBDP
4.0000 mg | ORAL_TABLET | Freq: Four times a day (QID) | ORAL | Status: AC | PRN
  Administered 2021-05-05 (×2): 4 mg via ORAL
  Filled 2021-05-05 (×2): qty 1

## 2021-05-05 MED ORDER — VERAPAMIL HCL ER 180 MG PO TBCR
180.0000 mg | EXTENDED_RELEASE_TABLET | Freq: Every day | ORAL | Status: DC
  Administered 2021-05-05 – 2021-05-14 (×10): 180 mg via ORAL
  Filled 2021-05-05 (×12): qty 1

## 2021-05-05 MED ORDER — LOPERAMIDE HCL 2 MG PO CAPS: 2.0000 mg | ORAL_CAPSULE | ORAL | Status: AC | PRN

## 2021-05-05 MED ORDER — THIAMINE HCL 100 MG PO TABS
100.0000 mg | ORAL_TABLET | Freq: Every day | ORAL | Status: DC
  Administered 2021-05-06 – 2021-05-15 (×10): 100 mg via ORAL
  Filled 2021-05-05 (×13): qty 1

## 2021-05-05 MED ORDER — MAGNESIUM HYDROXIDE 400 MG/5ML PO SUSP: 30.0000 mL | Freq: Every day | ORAL | Status: DC | PRN

## 2021-05-05 MED ORDER — ACETAMINOPHEN 325 MG PO TABS
650.0000 mg | ORAL_TABLET | Freq: Four times a day (QID) | ORAL | Status: DC | PRN
  Administered 2021-05-05: 650 mg via ORAL
  Filled 2021-05-05: qty 2

## 2021-05-05 NOTE — Tx Team (Signed)
Initial Treatment Plan 05/05/2021 4:09 AM [name] AVW:[id number]    PATIENT STRESSORS: Financial difficulties   PATIENT STRENGTHS: Ability for insight Motivation for treatment/growth Supportive family/friends   PATIENT IDENTIFIED PROBLEMS: Suicidal Ideation  (Find joy again and coping mechanisms)                   DISCHARGE CRITERIA:  Improved stabilization in mood, thinking, and/or behavior Verbal commitment to aftercare and medication compliance  PRELIMINARY DISCHARGE PLAN: Outpatient therapy Return to previous living arrangement  PATIENT/FAMILY INVOLVEMENT: This treatment plan has been presented to and reviewed with the patient, Alexis Wall, and/or family member.  The patient and family have been given the opportunity to ask questions and make suggestions.  Arvid Right, RN 05/05/2021, 4:09 AM

## 2021-05-05 NOTE — Progress Notes (Signed)
NUTRITION ASSESSMENT  Pt identified as at risk on the Malnutrition Screen Tool  INTERVENTION: 1. Supplements: Ensure Enlive po BID, each supplement provides 350 kcal and 20 grams of protein   NUTRITION DIAGNOSIS: Unintentional weight loss related to sub-optimal intake as evidenced by pt report.   Goal: Pt to meet >/= 90% of their estimated nutrition needs.  Monitor:  PO intake  Assessment:  Pt admitted for MDD.  Pt reports poor appetite and losing 30 lbs over the past 3 months. Per weight records, pt lost 25 lbs since 3/20 (10% wt loss x 2.5 months, significant for time frame).  Ensure supplements have been ordered.    Height: Ht Readings from Last 1 Encounters:  05/05/21 5\' 4"  (1.626 m)    Weight: Wt Readings from Last 1 Encounters:  05/05/21 101.6 kg    Weight Hx: Wt Readings from Last 10 Encounters:  05/05/21 101.6 kg  02/12/21 113.4 kg  05/16/20 127.5 kg  02/09/20 123.4 kg  01/07/20 121.1 kg  08/27/18 113.6 kg  08/25/18 113.6 kg  05/07/16 117.5 kg  04/20/16 117.5 kg  04/12/16 108.9 kg    BMI:  Body mass index is 38.45 kg/m. Pt meets criteria for obesity based on current BMI.  Estimated Nutritional Needs: Kcal: 25-30 kcal/kg Protein: > 1 gram protein/kg Fluid: 1 ml/kcal  Diet Order:  Diet Order             Diet regular Room service appropriate? Yes; Fluid consistency: Thin  Diet effective now                  Pt is also offered choice of unit snacks mid-morning and mid-afternoon.  Pt is eating as desired.   Lab results and medications reviewed.   Clayton Bibles, MS, RD, LDN Inpatient Clinical Dietitian Contact information available via Amion

## 2021-05-05 NOTE — Progress Notes (Signed)
Adult Psychoeducational Group Note  Date:  05/05/2021 Time:  10:45 AM  Group Topic/Focus:  Goals Group:   The focus of this group is to help patients establish daily goals to achieve during treatment and discuss how the patient can incorporate goal setting into their daily lives to aide in recovery.  Participation Level:  Did Not Attend  Alexis Wall 05/05/2021, 10:45 AM

## 2021-05-05 NOTE — ED Notes (Signed)
Gave report to [facility] on pt, [facility] confirmed pt had bed ready.

## 2021-05-05 NOTE — Progress Notes (Signed)
   05/05/21 1900  Psych Admission Type (Psych Patients Only)  Admission Status Involuntary  Psychosocial Assessment  Patient Complaints Anxiety;Appetite decrease;Decreased concentration;Worrying  Eye Contact Brief  Facial Expression Anxious;Sad;Sullen  Affect Anxious;Preoccupied  Speech Logical/coherent;Soft  Interaction Assertive  Motor Activity Slow;Unsteady  Appearance/Hygiene In scrubs  Behavior Characteristics Cooperative;Unable to participate  Mood Depressed;Anxious  Thought Process  Coherency WDL  Content Blaming self  Delusions None reported or observed  Perception WDL  Hallucination None reported or observed  Judgment Poor  Confusion None  Danger to Self  Current suicidal ideation? Passive  Self-Injurious Behavior No self-injurious ideation or behavior indicators observed or expressed   Agreement Not to Harm Self Yes  Description of Agreement Verbal contract  Danger to Others  Danger to Others None reported or observed

## 2021-05-05 NOTE — Progress Notes (Signed)
Recreation Therapy Notes  Date:  6.10.22 Time: 0930 Location: [address] Dayroom  Group Topic: Stress Management  Goal Area(s) Addresses:  Patient will identify positive stress management techniques. Patient will identify benefits of using stress management post d/c.  Intervention: Stress Management  Activity:  Meditation.  LRT played a meditation that focused on calming anxiety by allowing the breathing to relax you and also in the moment clear the mind of any worry or things out your control.    Education:  Stress Management, Discharge Planning.   Education Outcome: Acknowledges Education  Clinical Observations/Feedback: Pt did not attend group session.     Victorino Sparrow, LRT/CTRS         Ria Comment, Shawnique Mariotti A 05/05/2021 11:12 AM

## 2021-05-05 NOTE — H&P (Addendum)
Psychiatric Admission Assessment Adult  Patient Identification: PAULLETTE MCKAIN MRN:  [id number] Date of Evaluation:  05/05/2021 Chief Complaint:  MDD (major depressive disorder), recurrent severe, without psychosis ([place]) [F33.2] Principal Diagnosis: MDD (major depressive disorder), recurrent severe, without psychosis ([place]) Diagnosis:  Principal Problem:   MDD (major depressive disorder), recurrent severe, without psychosis ([place]) Active Problems:   Generalized anxiety disorder  MDD (major depressive disorder), recurrent severe, without psychosis ([place])  History of Present Illness: [name] ("Debbie") is a 55 year old female with past medical history of hypertension, anemia, OSA, hyperlipidemia, and GERD, as well as psychiatric history of MDD, anxiety, bipolar disorder. This is the patient's first [facility]) Admission.   Patient presented to [facility] emergency department on 05/04/2021 due to an overdose on Prozac, Ambien, and Xanax.  Per chart review of 05/04/2021 emergency department visit, patient's daughter heard the patient fall and then found the patient on the floor.  Poison control was contacted while the patient was in the emergency department and proper Poison control recommendations were put in place by patient's EDP. Patient was medically cleared in the ED at 2200 on 05/04/2021.  Patient was placed under IVC on 05/04/2021 by Dr. Ralene Bathe at [facility] emergency department and patient's first exam was completed on 05/04/2021 in the emergency department as well.  Patient's affidavit and petition for patient's IVC paperwork states as follows:  "Tyger overdosed on her psychiatric medications.  She had written suicide notes to both her children.  Daughter found her in the bathroom with the door locked".   Please see patient's IVC paperwork for further details if necessary. Patient was assessed by TTS on 05/04/2021 and inpatient psychiatric treatment was recommended for  the patient by myself upon TTS assessment.  Please see Jeanmarie Plant, LCSW 05/04/2021 note for further details regarding patient's 05/04/2021 TTS assessment if necessary.  Patient was accepted to [facility] and was transferred to [facility] from [facility] emergency department to begin her inpatient psychiatric treatment stay.  Patient is seen and examined by myself upon her arrival to [facility] from [facility] emergency department.  Patient states that on 05/04/2021 she took "a 25-day supply or however much was left in the bottles" of her Ambien, Xanax, and Prozac medications around 1100 on 05/04/2021.  Per chart review of 05/04/2021 ED visit, patient reported in the emergency department that she took 30 Ambien tablets, 3 Xanax tablets, and 3 Prozac tablets at 1330 on 05/04/2021.  When patient was asked if this overdose was a suicide attempt, patient initially does not provide an answer, then states "it was a spur of the moment from all the pain and anxiety and depression".  When patient is asked again if this overdose was a suicide attempt, patient states "I guess it was because I wanted out of this state of mind".  Per 05/04/2021 TTS assessment note, patient stated the following to TTS counselor: "Pt admits that she planned out the attempt and wrote suicide notes to all family members. Pt states "I am sick and tired of the pain and suffering--I just wanted to end it all"."  Patient does not mention/provide details regarding suicide notes written to family members during my assessment.  Patient denies SI currently on exam.  Patient denies any past suicide attempts prior to this 05/04/2021 overdose attempt.  Patient denies any history of self-injurious behavior via intentionally cutting or burning herself.  Patient denies HI, AVH, paranoia, or delusions.  Patient reports that her most prevalent stressors that  have been contributing to her worsening depression and anxiety are the following:   -Patient states that her husband works a Press photographer job  that is commission based and he has been experiencing some work issues related to his compensation, which has been a Field seismologist on the patient and her family.  -Patient's daughter recently graduated high school and is starting college at [employer] in the fall 2022 and patient is stressed about her daughter leaving home to go to college.  -Patient states that her daughter has been having issues with anxiety and patient is stressed about trying to get help for her daughter's anxiety before her daughter leaves for college in the fall of 2022.  -Patient helps provide/handle care for her elderly mother as well as her elderly mother-in-law and father-in-law.  -Patient reports having "fear of the unknown" and stress regarding her and her husband being financially stable for retirement in the future.  -Patient reports that her father committed suicide 2 years ago on 05/21/2019 and the anniversary of her father's death approaching has been making the patient feel very stressed, depressed, and anxious.  -Patient reports stress relating to currently trying to sell her father's house.  Patient reports sleeping poorly, about 3 to 6 hours per night.  Patient endorses feelings of anhedonia, as well as feelings of guilt, hopelessness, and worthlessness.  Patient reports feeling fatigued and having declines in energy.  Patient denies any changes in her concentration.  Patient does report appetite decrease and a loss of 30 pounds over the past 3 months.  Patient states that her current home psychotropic medication regimen consists of Prozac 60 mg (3 20 mg capsules) daily, Ambien 10 mg at bedtime, Xanax 0.5 mg 3 times daily, and propranolol 20 mg (2 10 mg tablets) three times daily for panic attacks.  Patient states that she had an initial appointment with a new outpatient psychiatrist at [facility] on 05/03/2021 after not seeing an outpatient psychiatrist since 2008/2009. Patient states that prior to her new psychiatry  appointment, she had been taking Xanax for an unknown period of time, then switched to taking Klonopin and not Xanax for an unknown period of time, and then at this new psychiatry appointment mentioned above, her new psychiatrist took her off of Klonopin and started her on Xanax because the Klonopin was "making me feel low". However, PDMP review shows history of patient being prescribed Klonopin 1 mg on 04/28/2021, Ambien 10 mg 04/28/2021, and Xanax 0.5 mg on 03/24/2021 followed by the same provider.  Patient states that she received 5 rounds of ECT in 2008 at [facility] for her depression and anxiety, and patient states that she responded to the ECT treatment very well.  Per chart review, patient presented to [facility] on 04/21/2021 and [facility] emergency department on 04/23/2021 requesting assistance with being scheduled for ECT treatment again on both of these occasions.  Patient states that at her recent 05/03/21 [facility] psychiatry appointment, she got put on a wait list for ECT treatment.  Patient states that she has not seen a therapist since before 2008/2009.  Patient states that she was hospitalized at [facility] during her first round of ECT treatment in 2008, the patient denies history of any additional psychiatric hospitalizations.  Patient has documented history of anxiety, depression, and bipolar disorder.  However, upon my assessment of the patient, patient endorses only being diagnosed with depression and states that she struggles with anxiety, but denies any history of any other  psychiatric diagnoses.  Additionally, patient denies any symptoms of mania on exam, including easy distractibility, engaging in discretionary behaviors such as risky sexual behavior or excessive spending, thoughts of grandiosity, flight of ideas, or experiencing sleepless nights due to feeling of lack of need for sleep. Patient also does not appear to be manic or hypomanic on exam. Patient  reports that she lives in [place] with her husband and daughter.  Patient states that she also has a son that lives in [place].  Patient denies any access to guns or weapons at home.  Patient reports that she has not drank any alcohol since March 2022 and she states that prior to March 2022, she was drinking 1 glass of wine on rare occasion.  Patient denies any history of seizures.  Patient denies any tobacco or illicit drug use.  Patient denies any history of being a victim of verbal, physical, or sexual abuse.  Patient states that her main source of support is her husband.  Patient states that her main goals for treatment while at [facility] are to "find joy, learn how to handle things better, and be the wife and mom I want to be".  On exam, patient is sitting comfortably in a chair in no acute distress.  Patient's mood is depressed and affect is mood congruent and also flat appearing.  Patient is cooperative, alert and oriented x4, and answers all questions appropriately.  Patient does not appear to be responding to internal or external stimuli on exam.  Patient appears to be minimizing her feelings of depression/anxiety and current mental health status and appears to have poor insight into her current mental health status/condition at this time (patient remains fixated on who her roommate will be while she is at [facility], asks about when she will be discharged/asks about potentially being able to be discharged from [facility] within 72 hours, mentions that her daughter has orientation for college this coming Thursday and states that she needs to be present for that, states that she needs to have her Ambien right now to be able to sleep despite the fact that this is one of the medications that she overdosed on within the past 24 hours).   Associated Signs/Symptoms: Depression Symptoms:  depressed mood, anhedonia, insomnia, fatigue, feelings of worthlessness/guilt, hopelessness, suicidal  thoughts with specific plan, suicidal attempt, anxiety, loss of energy/fatigue, Duration of Depression Symptoms: Greater than two weeks  (Hypo) Manic Symptoms:   None. Anxiety Symptoms:  Excessive Worry, Psychotic Symptoms:   None. PTSD Symptoms: None. Total Time spent with patient: 1 hour  Past Psychiatric History: Patient has documented history of anxiety, depression, and bipolar disorder.  However, upon my assessment of the patient, patient endorses only being diagnosed with depression and states that she struggles with anxiety, but denies any history of any other psychiatric diagnoses.  Is the patient at risk to self? Yes.    Has the patient been a risk to self in the past 6 months? No.  Has the patient been a risk to self within the distant past? No.  Is the patient a risk to others? No.  Has the patient been a risk to others in the past 6 months? No.  Has the patient been a risk to others within the distant past? No.   Prior Inpatient Therapy:  Patient states that she was hospitalized at [facility] during her first round of ECT treatment in 2008, the patient denies history of any additional psychiatric hospitalizations. Prior Outpatient Therapy:  Patient states that she had an initial appointment with a new outpatient psychiatrist at [facility] on 05/03/2021 after not seeing an outpatient psychiatrist since 2008/2009.   Alcohol Screening: 1. How often do you have a drink containing alcohol?: Never 2. How many drinks containing alcohol do you have on a typical day when you are drinking?: 1 or 2 3. How often do you have six or more drinks on one occasion?: Never AUDIT-C Score: 0 9. Have you or someone else been injured as a result of your drinking?: No 10. Has a relative or friend or a doctor or another health worker been concerned about your drinking or suggested you cut down?: No Alcohol Use Disorder Identification Test Final Score (AUDIT): 0 Substance Abuse History in the last 12 months:   No. 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   Bipolar disorder ([place])    per Dr D.Patterson's office note 07/08/2007   Depression    [facility]   Diabetes mellitus    GESTATIONAL-not now   GERD (gastroesophageal reflux disease)    Hypertension    Morbid obesity ([place])    Polyp of colon    Sleep apnea    Wears CPAP    Past Surgical History:  Procedure Laterality Date   ABDOMINAL HYSTERECTOMY  03/21/2010   TAH   BREATH TEK H PYLORI  08/05/2012   Procedure: BREATH TEK H PYLORI;  Surgeon: Pedro Earls, MD;  Location: [facility];  Service: General;  Laterality: N/A;  [place]  01/26/1999, 09/30/2002   X2.Marland Kitchen WITH BTSP   CHOLECYSTECTOMY N/A 08/27/2018   Procedure: LAPAROSCOPIC CHOLECYSTECTOMY WITH INTRAOPERATIVE CHOLANGIOGRAM;  Surgeon: Excell Seltzer, MD;  Location: [facility];  Service: General;  Laterality: N/A;   ENDOMETRIAL ABLATION  08/25/2009   HER OPTION    Family History:  Family History  Problem Relation Age of Onset   Hypertension Father    Obesity Father    Cancer Maternal Grandfather        colon   Sleep apnea Mother    Heart disease Paternal Grandfather    Colon cancer Neg Hx    Family Psychiatric  History: Patient reports history of depression in her father and she reports that her father committed suicide 2 years ago on 05/21/2019. Tobacco Screening: Have you used any form of tobacco in the last 30 days? (Cigarettes, Smokeless Tobacco, Cigars, and/or Pipes): No Are you interested in Tobacco Cessation Medications?: No, patient refused Counseled patient on smoking cessation including recognizing danger situations, developing coping skills and basic information about quitting provided: Refused/Declined practical counseling Social  History:  Social History   Substance and Sexual Activity  Alcohol Use Yes   Alcohol/week: 1.0 standard drink   Types: 1 Glasses of wine per week   Comment: occasional glass of wine once per week     Social History   Substance and Sexual Activity  Drug Use No    Additional Social History: See HPI.   Allergies:   Allergies  Allergen Reactions   Penicillins Rash    Happened in childhood, does not remember if it spread all over her body or not. Has patient had a PCN reaction causing immediate rash, facial/tongue/throat swelling, SOB or lightheadedness with hypotension: no Has patient had a PCN reaction  causing severe rash involving mucus membranes or skin necrosis: no Has patient had a PCN reaction that required hospitalization no Has patient had a PCN reaction occurring within the last 10 years: no If all of the above answers are "NO", then may proceed with C   Lab Results:  Results for orders placed or performed during the hospital encounter of 05/04/21 (from the past 48 hour(s))  Acetaminophen level     Status: Abnormal   Collection Time: 05/04/21  2:28 PM  Result Value Ref Range   Acetaminophen (Tylenol), Serum <10 (L) 10 - 30 ug/mL    Comment: (NOTE) Therapeutic concentrations vary significantly. A range of 10-30 ug/mL  may be an effective concentration for many patients. However, some  are best treated at concentrations outside of this range. Acetaminophen concentrations >150 ug/mL at 4 hours after ingestion  and >50 ug/mL at 12 hours after ingestion are often associated with  toxic reactions.  Performed at [facility], [place] [address], [place], [place] [address]   Salicylate level     Status: Abnormal   Collection Time: 05/04/21  2:28 PM  Result Value Ref Range   Salicylate Lvl <1.6 (L) 7.0 - 30.0 mg/dL    Comment: Performed at [facility], [place] [address], [place], [place] [address]  Comprehensive metabolic panel     Status:  Abnormal   Collection Time: 05/04/21  4:28 PM  Result Value Ref Range   Sodium 138 135 - 145 mmol/L   Potassium 3.8 3.5 - 5.1 mmol/L   Chloride 107 98 - 111 mmol/L   CO2 25 22 - 32 mmol/L   Glucose, Bld 101 (H) 70 - 99 mg/dL    Comment: Glucose reference range applies only to samples taken after fasting for at least 8 hours.   BUN 9 6 - 20 mg/dL   Creatinine, Ser 0.80 0.44 - 1.00 mg/dL   Calcium 9.5 8.9 - 10.3 mg/dL   Total Protein 6.5 6.5 - 8.1 g/dL   Albumin 3.4 (L) 3.5 - 5.0 g/dL   AST 20 15 - 41 U/L   ALT 23 0 - 44 U/L   Alkaline Phosphatase 74 38 - 126 U/L   Total Bilirubin 0.6 0.3 - 1.2 mg/dL   GFR, Estimated >60 >60 mL/min    Comment: (NOTE) Calculated using the CKD-EPI Creatinine Equation (2021)    Anion gap 6 5 - 15    Comment: Performed at [facility], [place] [address], [place],  [address]  CBC with Differential     Status: None   Collection Time: 05/04/21  4:28 PM  Result Value Ref Range   WBC 9.0 4.0 - 10.5 K/uL   RBC 4.73 3.87 - 5.11 MIL/uL   Hemoglobin 13.6 12.0 - 15.0 g/dL   HCT 42.3 36.0 - 46.0 %   MCV 89.4 80.0 - 100.0 fL   MCH 28.8 26.0 - 34.0 pg   MCHC 32.2 30.0 - 36.0 g/dL   RDW 13.4 11.5 - 15.5 %   Platelets 280 150 - 400 K/uL   nRBC 0.0 0.0 - 0.2 %   Neutrophils Relative % 75 %   Neutro Abs 6.6 1.7 - 7.7 K/uL   Lymphocytes Relative 14 %   Lymphs Abs 1.3 0.7 - 4.0 K/uL   Monocytes Relative 10 %   Monocytes Absolute 0.9 0.1 - 1.0 K/uL   Eosinophils Relative 1 %   Eosinophils Absolute 0.1 0.0 - 0.5 K/uL   Basophils Relative  0 %   Basophils Absolute 0.0 0.0 - 0.1 K/uL   Immature Granulocytes 0 %   Abs Immature Granulocytes 0.03 0.00 - 0.07 K/uL    Comment: Performed at [facility], [place] [address], [place], [place] [address]  Magnesium     Status: None   Collection Time: 05/04/21  4:28 PM  Result Value Ref Range   Magnesium 2.2 1.7 - 2.4 mg/dL    Comment: Performed at [facility],  [place] [address], [place], [place] [address]  Lipid panel     Status: Abnormal   Collection Time: 05/04/21  4:28 PM  Result Value Ref Range   Cholesterol 118 0 - 200 mg/dL   Triglycerides 109 <150 mg/dL   HDL 39 (L) >40 mg/dL   Total CHOL/HDL Ratio 3.0 RATIO   VLDL 22 0 - 40 mg/dL   LDL Cholesterol 57 0 - 99 mg/dL    Comment:        Total Cholesterol/HDL:CHD Risk Coronary Heart Disease Risk Table                     Men   Women  1/2 Average Risk   3.4   3.3  Average Risk       5.0   4.4  2 X Average Risk   9.6   7.1  3 X Average Risk  23.4   11.0        Use the calculated Patient Ratio above and the CHD Risk Table to determine the patient's CHD Risk.        ATP III CLASSIFICATION (LDL):  <100     mg/dL   Optimal  100-129  mg/dL   Near or Above                    Optimal  130-159  mg/dL   Borderline  160-189  mg/dL   High  >190     mg/dL   Very High Performed at [place] [address] [place], [place] [address]   TSH     Status: None   Collection Time: 05/04/21  4:28 PM  Result Value Ref Range   TSH 2.115 0.350 - 4.500 uIU/mL    Comment: Performed by a 3rd Generation assay with a functional sensitivity of <=0.01 uIU/mL. Performed at [facility], [place] [address] [place], [place] [address]   Resp Panel by RT-PCR (Flu A&B, Covid) Nasopharyngeal Swab     Status: None   Collection Time: 05/04/21  4:37 PM   Specimen: Nasopharyngeal Swab; Nasopharyngeal(NP) swabs in vial transport medium  Result Value Ref Range   SARS Coronavirus 2 by RT PCR NEGATIVE NEGATIVE    Comment: (NOTE) SARS-CoV-2 target nucleic acids are NOT DETECTED.  The SARS-CoV-2 RNA is generally detectable in upper respiratory specimens during the acute phase of infection. The lowest concentration of SARS-CoV-2 viral copies this assay can detect is 138 copies/mL. A negative result does not preclude SARS-Cov-2 infection and should not be used as the sole basis for  treatment or other patient management decisions. A negative result may occur with  improper specimen collection/handling, submission of specimen other than nasopharyngeal swab, presence of viral mutation(s) within the areas targeted by this assay, and inadequate number of viral copies(<138 copies/mL). A negative result must be combined with clinical observations, patient history, and epidemiological information. The expected result is Negative.  Fact Sheet for Patients:  EntrepreneurPulse.com.au  Fact Sheet  for Healthcare Providers:  IncredibleEmployment.be  This test is no t yet approved or cleared by the [place] and  has been authorized for detection and/or diagnosis of SARS-CoV-2 by FDA under an Emergency Use Authorization (EUA). This EUA will remain  in effect (meaning this test can be used) for the duration of the COVID-19 declaration under Section 564(b)(1) of the Act, 21 U.S.C.section 360bbb-3(b)(1), unless the authorization is terminated  or revoked sooner.       Influenza A by PCR NEGATIVE NEGATIVE   Influenza B by PCR NEGATIVE NEGATIVE    Comment: (NOTE) The Xpert Xpress SARS-CoV-2/FLU/RSV plus assay is intended as an aid in the diagnosis of influenza from Nasopharyngeal swab specimens and should not be used as a sole basis for treatment. Nasal washings and aspirates are unacceptable for Xpert Xpress SARS-CoV-2/FLU/RSV testing.  Fact Sheet for Patients: EntrepreneurPulse.com.au  Fact Sheet for Healthcare Providers: IncredibleEmployment.be  This test is not yet approved or cleared by the [place] FDA and has been authorized for detection and/or diagnosis of SARS-CoV-2 by FDA under an Emergency Use Authorization (EUA). This EUA will remain in effect (meaning this test can be used) for the duration of the COVID-19 declaration under Section 564(b)(1) of the Act, 21 U.S.C. section  360bbb-3(b)(1), unless the authorization is terminated or revoked.  Performed at [facility], [place] [address], [place], [place] [address]   Urine rapid drug screen (hosp performed)     Status: Abnormal   Collection Time: 05/04/21  5:55 PM  Result Value Ref Range   Opiates NONE DETECTED NONE DETECTED   Cocaine NONE DETECTED NONE DETECTED   Benzodiazepines POSITIVE (A) NONE DETECTED   Amphetamines NONE DETECTED NONE DETECTED   Tetrahydrocannabinol NONE DETECTED NONE DETECTED   Barbiturates NONE DETECTED NONE DETECTED    Comment: (NOTE) DRUG SCREEN FOR MEDICAL PURPOSES ONLY.  IF CONFIRMATION IS NEEDED FOR ANY PURPOSE, NOTIFY LAB WITHIN 5 DAYS.  LOWEST DETECTABLE LIMITS FOR URINE DRUG SCREEN Drug Class                     Cutoff (ng/mL) Amphetamine and metabolites    1000 Barbiturate and metabolites    200 Benzodiazepine                 272 Tricyclics and metabolites     300 Opiates and metabolites        300 Cocaine and metabolites        300 THC                            50 Performed at [facility], [place] [address] [place], [place] [address]   Acetaminophen level     Status: Abnormal   Collection Time: 05/04/21  8:15 PM  Result Value Ref Range   Acetaminophen (Tylenol), Serum <10 (L) 10 - 30 ug/mL    Comment: (NOTE) Therapeutic concentrations vary significantly. A range of 10-30 ug/mL  may be an effective concentration for many patients. However, some  are best treated at concentrations outside of this range. Acetaminophen concentrations >150 ug/mL at 4 hours after ingestion  and >50 ug/mL at 12 hours after ingestion are often associated with  toxic reactions.  Performed at [facility], [place] [address] [place],  [address]     Blood Alcohol level:  No results found for: [facility]  Metabolic Disorder Labs:  Lab Results  Component Value  Date   HGBA1C 6.2 (H) 08/25/2018   MPG 131 08/25/2018   No results  found for: PROLACTIN Lab Results  Component Value Date   CHOL 118 05/04/2021   TRIG 109 05/04/2021   HDL 39 (L) 05/04/2021   CHOLHDL 3.0 05/04/2021   VLDL 22 05/04/2021   LDLCALC 57 05/04/2021    Current Medications: Current Facility-Administered Medications  Medication Dose Route Frequency Provider Last Rate Last Admin   acetaminophen (TYLENOL) tablet 650 mg  650 mg Oral Q6H PRN Prescilla Sours, PA-C       alum & mag hydroxide-simeth (MAALOX/MYLANTA) 200-200-20 MG/5ML suspension 30 mL  30 mL Oral Q4H PRN [name] W, PA-C       feeding supplement (ENSURE ENLIVE / ENSURE PLUS) liquid 237 mL  237 mL Oral BID BM Lovena Le, Jerusalen Mateja W, PA-C       lisinopril (ZESTRIL) tablet 10 mg  10 mg Oral Daily Lovena Le, Cressie Betzler W, PA-C       magnesium hydroxide (MILK OF MAGNESIA) suspension 30 mL  30 mL Oral Daily PRN Lovena Le, Dakari Cregger W, PA-C       pantoprazole (PROTONIX) EC tablet 40 mg  40 mg Oral QHS Lovena Le, Suvan Stcyr W, PA-C       verapamil (CALAN-SR) CR tablet 180 mg  180 mg Oral QHS [name] W, PA-C       PTA Medications: Medications Prior to Admission  Medication Sig Dispense Refill Last Dose   propranolol (INDERAL) 10 MG tablet Take 20 mg by mouth 3 (three) times daily. Patient reports that she takes this scheduled for anxiety.      ALPRAZolam (XANAX) 0.5 MG tablet Take 0.5 mg by mouth 3 (three) times daily.      clonazePAM (KLONOPIN) 1 MG tablet Take 1 mg by mouth 2 (two) times daily.      FLUoxetine (PROZAC) 20 MG capsule Take 60 mg by mouth daily.      lisinopril (ZESTRIL) 10 MG tablet Take 10 mg by mouth daily.      omeprazole (PRILOSEC) 20 MG capsule Take 20 mg by mouth at bedtime.       verapamil (VERELAN PM) 180 MG 24 hr capsule Take 180 mg by mouth at bedtime.  11    zolpidem (AMBIEN) 10 MG tablet Take 10 mg by mouth at bedtime. Takes an additional 5 mg if needed.       Musculoskeletal: Strength & Muscle Tone: within normal limits Gait & Station:  Slightly unsteady, but patient is able to  ambulate independently at this time (Patient is a high fall risk). Patient leans: N/A   Psychiatric Specialty Exam:  Presentation  General Appearance: Disheveled; Fairly Groomed  Eye Contact:Fair; Engineer, civil (consulting) and Coherent; Normal Rate  Speech Volume:Decreased  Handedness: No data recorded  Mood and Affect  Mood:Depressed  Affect:Flat; Congruent   Thought Process  Thought Processes:Coherent; Goal Directed; Linear  Duration of Psychotic Symptoms: No data recorded Past Diagnosis of Schizophrenia or Psychoactive disorder: No  Descriptions of Associations:Intact  Orientation:Full (Time, Place and Person)  Thought Content:WDL  Hallucinations:Hallucinations: None  Ideas of Reference:None  Suicidal Thoughts:Suicidal Thoughts: No  Homicidal Thoughts:Homicidal Thoughts: No   Sensorium  Memory:Immediate Good; Recent Good; Remote Good  Judgment:Impaired  Insight:Lacking   Executive Functions  Concentration:Fair  Attention Span:Fair  [place] of Knowledge:Good  Language:Good   Psychomotor Activity  Psychomotor Activity:Psychomotor Activity: Normal   Assets  Assets:Communication Skills; Desire for Improvement; Financial Resources/Insurance; Housing; Leisure Time; Physical Health; Resilience; Social  Support   Sleep  Sleep:Sleep: Poor Number of Hours of Sleep: 3    Physical Exam: Physical Exam Vitals reviewed.  Constitutional:      General: She is not in acute distress.    Appearance: She is obese. She is not ill-appearing, toxic-appearing or diaphoretic.  HENT:     Head: Normocephalic and atraumatic.     Right Ear: External ear normal.     Left Ear: External ear normal.  Eyes:     General:        Right eye: No discharge.        Left eye: No discharge.     Conjunctiva/sclera: Conjunctivae normal.  Cardiovascular:     Rate and Rhythm: Bradycardia present.  Pulmonary:     Effort: Pulmonary effort is normal. No  respiratory distress.  Musculoskeletal:        General: Normal range of motion.     Cervical back: Normal range of motion.  Neurological:     Mental Status: She is alert and oriented to person, place, and time.     Comments: No tremor noted.   Psychiatric:        Attention and Perception: Attention and perception normal. She does not perceive auditory or visual hallucinations.        Mood and Affect: Mood is depressed.        Speech: Speech normal.        Behavior: Behavior is not agitated, slowed, aggressive, withdrawn, hyperactive or combative. Behavior is cooperative.        Thought Content: Thought content is not paranoid or delusional. Thought content does not include homicidal ideation.     Comments: Affect is flat and congruent with mood. Patient denies SI currently on exam.    Review of Systems  Constitutional:  Positive for malaise/fatigue and weight loss. Negative for chills, diaphoresis and fever.  HENT:  Negative for congestion.   Respiratory:  Negative for cough and shortness of breath.   Cardiovascular:  Negative for chest pain and palpitations.  Gastrointestinal:  Negative for abdominal pain, constipation, diarrhea, nausea and vomiting.  Genitourinary:        Patient endorses history of nighttime urinary incontinence.   Musculoskeletal:  Negative for joint pain and myalgias.  Neurological:  Negative for dizziness, seizures and headaches.  Psychiatric/Behavioral:  Positive for depression and suicidal ideas. Negative for hallucinations, memory loss and substance abuse. The patient is nervous/anxious and has insomnia.   All other systems reviewed and are negative.  Vitals: Blood pressure 119/64, pulse (!) 54, temperature 98 F (36.7 C), temperature source Oral, resp. rate 18, height 5\' 4"  (1.626 m), weight 101.6 kg, SpO2 100 %. Body mass index is 38.45 kg/m.  Treatment Plan Summary: Admit patient to [facility] adult unit for crisis stabilization and treatment of depression,  anxiety, and SI.  Patient will be integrated into the milieu and will engage in group sessions and therapy, as well as medication management. Daily contact with patient to assess and evaluate symptoms and progress in treatment and Medication management, group sessions, and therapy.   Current medications ordered:  -Tylenol 650 mg p.o. every 6 hours as needed for mild pain  -Maalox/Mylanta 200-200-20 milligrams per 5 mL suspension 30 mL p.o. every 4 hours as needed for indigestion  -Ensure Enlive/Ensure Plus feeding supplement liquid 237 mL p.o. twice daily between meals for nutrition supplementation (due to patient's reported 30 pound weight loss mentioned above)  -Will continue the following home medications:   -Lisinopril 10  mg p.o. daily for hypertension   -Verapamil 180 mg p.o. daily at bedtime for hypertension   -Protonix 40 mg p.o. daily at bedtime for GERD (formulary alternative to patient's home medication of Prilosec 20 mg p.o. at bedtime)  Due to patient's recent overdose, will not initiate any psychotropic medications at this time, including patient's home medications of propranolol, Xanax/Klonopin, Prozac, or Ambien at this time.  Will defer to day shift treatment team to decide which psychotropic medications to initiate after more time has passed since patient's recent overdose.  Patient has OSA and uses a CPAP at home.  Patient's husband to bring patient's home CPAP device to [facility] during the day on 05/05/2021.  Patient may use her own CPAP device for sleep at night while at [facility].  Patient's home CPAP device to be stored at [facility] adult unit nurses station when not in use.  Patient also wears pull-ups/depends underwear.  Patient's husband also to bring pull-ups/depends underwear from home to [facility] on 05/05/2021 as well for patient to wear while at [facility].  Care order placed for patient's CPAP and depends underwear.  Observation Level/Precautions:  15 minute checks  Laboratory:   Labs/tests reviewed:   -Tylenol level x2 (05/04/21): Within normal limits -Salicylate level: Within normal limits -CMP: Unremarkable -CBC with differential: Within normal limits -Magnesium: Within normal limits -Lipid panel: Unremarkable -TSH: Within normal limits -PCR COVID: Negative -Hemoglobin A1c: Results pending -UDS: Positive for benzodiazepines -05/04/2021 EKG: Confirmed by EDP.  No acute/concerning findings.  Ventricular rate 52 bpm, PR interval 153 ms, QRS 93 ms, QT/QTC 446/415 ms  Psychotherapy: Group therapy  Medications: See treatment plan summary above/MAR  Consultations: TBD (none ordered at this time)  Discharge Concerns: SI, patient's safety, patient's access to medications, patient's ability to contract for safety  Estimated LOS: 3 to 5 days (estimated LOS may change in the future depending on patient's response to treatment).  Other:     Physician Treatment Plan for Primary Diagnosis: MDD (major depressive disorder), recurrent severe, without psychosis ([place]) Long Term Goal(s): Improvement in symptoms so as ready for discharge  Short Term Goals: Ability to identify changes in lifestyle to reduce recurrence of condition will improve, Ability to verbalize feelings will improve, Ability to disclose and discuss suicidal ideas, Ability to demonstrate self-control will improve, Ability to identify and develop effective coping behaviors will improve, Compliance with prescribed medications will improve, and Ability to identify triggers associated with substance abuse/mental health issues will improve  Physician Treatment Plan for Secondary Diagnosis: Principal Problem:   MDD (major depressive disorder), recurrent severe, without psychosis ([place]) Active Problems:   Generalized anxiety disorder  Long Term Goal(s): Improvement in symptoms so as ready for discharge  Short Term Goals: Ability to identify changes in lifestyle to reduce recurrence of condition will improve, Ability to verbalize feelings will  improve, Ability to disclose and discuss suicidal ideas, Ability to demonstrate self-control will improve, Ability to identify and develop effective coping behaviors will improve, Compliance with prescribed medications will improve, and Ability to identify triggers associated with substance abuse/mental health issues will improve  I certify that inpatient services furnished can reasonably be expected to improve the patient's condition.    Please see psychiatrist's SRA for further details regarding patient's treatment plan.  Prescilla Sours, PA-C 6/10/20225:03 AM

## 2021-05-05 NOTE — H&P (Signed)
Psychiatric Admission Assessment Adult  Patient Identification: Alexis Wall MRN:  [id number] Date of Evaluation:  05/05/2021 Chief Complaint:  MDD (major depressive disorder), recurrent severe, without psychosis ([place]) [F33.2] Principal Diagnosis: MDD (major depressive disorder), recurrent severe, without psychosis ([place]) Diagnosis:  Principal Problem:   MDD (major depressive disorder), recurrent severe, without psychosis ([place]) Active Problems:   Generalized anxiety disorder  History of Present Illness: Medical record reviewed.  See also H&P note of [name] dated 05/05/2021 for additional detail.  Patient's case discussed in detail with members of the treatment team.  I met with and evaluated the patient on the unit today. Alexis Wall is a 55 year old female with a history of major depressive disorder and anxiety who presented to [facility] ED on 05/04/21 following an overdose on Prozac, Ambien and Xanax in a suicide attempt.  There are conflicting numbers of tablets documented as having been taken in the overdose in the EMR.  The patient reported on assessment in the ED that she planned out the attempt and wrote suicide notes to family members.  She was transferred to [facility] for further treatment of depression.  On interview with me this morning, the patient is tired and a bit groggy from her recent benzodiazepine ingestion and is a suboptimal historian.  She presents with depressed and anxious mood congruent tearful affect and circumstantial thought processes.  She reports that she has had worsening anxiety since the end of March 2022 and then developed depression.  She states that she became tired of taking medications and not getting relief and that her depressive symptoms had worsened in the last 2 days "I felt like I had to had it."  Patient states that she took "a bottle of Xanax and a bottle of Ambien because I wanted all this pain, depression and anxiety to go away."  Reportedly she had just been  seen by a psychiatrist at [facility] for consultation regarding her treatment and the possibility of receiving ECT at [facility].  During that consultation clonazepam which had recently been started for anxiety was changed back to alprazolam, her fluoxetine was increased and she was continued on her Ambien.  Most recent outpatient medication doses were fluoxetine 60 mg daily, alprazolam 0.5 mg 3 times daily, Ambien 10 to 15 mg at bedtime as needed for sleep and propranolol 3 times daily.  The patient states that clonazepam had recently been started at a dose of 1 twice daily but she felt she was becoming more depressed on it so she was changed back to alprazolam when she was seen at [place] 2 days ago.  The patient reports that her mood today is depressed and anxious.  She denies suicidal ideation and perseverates on wanting discharge.  He states that she feels she will always be depressed and she is concerned that her family is upset with her for being this way.  She denies any current symptoms of benzodiazepine withdrawal.  Patient states that her husband is currently working to facilitate transfer for patient to [facility] inpatient psychiatry where she can receive ECT.  Social work will obtain signature of the necessary consent to speak with husband and obtain additional information.  The patient reports prior diagnoses of major depression and generalized anxiety disorder.  She reports her first episode of depression was in 2008 and she was admitted at that time to the inpatient psychiatric hospital at [facility] and underwent ECT with improvement after 5 treatments.  She states that she did well until earlier in 2022  when the depression returned.  She denies any prior history of suicide attempts.   The patient reports a history of depression in her father and a history of anxiety in her mother.  She reports that her father committed suicide on May 21, 2019.  Dates she has a paternal cousin who overdosed on fentanyl in 2019 but she  does not know whether this was an accidental overdose or suicide attempt.   She denies any history of alcohol or drug use.  Associated Signs/Symptoms: Depression Symptoms:  depressed mood, anhedonia, insomnia, fatigue, feelings of worthlessness/guilt, difficulty concentrating, hopelessness, recurrent thoughts of death, suicidal thoughts with specific plan, suicidal attempt, anxiety, Duration of Depression Symptoms: Greater than two weeks  (Hypo) Manic Symptoms:   none Anxiety Symptoms:  Excessive Worry, Psychotic Symptoms:   none PTSD Symptoms: none Total Time spent with patient: 30 minutes  Past Psychiatric History: The patient reports prior diagnoses of major depression and generalized anxiety disorder.  She reports her first episode of depression was in 2008 and she was admitted at that time to the inpatient psychiatric hospital at [facility] and underwent ECT with improvement after 5 treatments.  She states that she did well until earlier in 2022 when the depression returned.  She denies any prior history of suicide attempts.  Is the patient at risk to self? Yes.    Has the patient been a risk to self in the past 6 months? Yes.    Has the patient been a risk to self within the distant past? No.  Is the patient a risk to others? No.  Has the patient been a risk to others in the past 6 months? No.  Has the patient been a risk to others within the distant past? No.   Prior Inpatient Therapy:   Prior Outpatient Therapy:    Alcohol Screening: 1. How often do you have a drink containing alcohol?: Never 2. How many drinks containing alcohol do you have on a typical day when you are drinking?: 1 or 2 3. How often do you have six or more drinks on one occasion?: Never AUDIT-C Score: 0 9. Have you or someone else been injured as a result of your drinking?: No 10. Has a relative or friend or a doctor or another health worker been concerned about your drinking or suggested you cut down?:  No Alcohol Use Disorder Identification Test Final Score (AUDIT): 0 Substance Abuse History in the last 12 months:  No. Consequences of Substance Abuse: NA Previous Psychotropic Medications: Yes  Psychological Evaluations: Yes  Past Medical History:  Past Medical History:  Diagnosis Date   Anemia    Arthritis    pains in knee   Bipolar disorder ([place])    per Dr D.Patterson's office note 07/08/2007   Depression    [facility]   Diabetes mellitus    GESTATIONAL-not now   GERD (gastroesophageal reflux disease)    Hypertension    Morbid obesity ([place])    Polyp of colon    Sleep apnea    Wears CPAP    Past Surgical History:  Procedure Laterality Date   ABDOMINAL HYSTERECTOMY  03/21/2010   TAH   BREATH TEK H PYLORI  08/05/2012   Procedure: BREATH TEK H PYLORI;  Surgeon: Pedro Earls, MD;  Location: [facility];  Service: General;  Laterality: N/A;  [place]  01/26/1999, 09/30/2002   X2.Marland Kitchen WITH BTSP   CHOLECYSTECTOMY N/A 08/27/2018   Procedure: LAPAROSCOPIC CHOLECYSTECTOMY WITH INTRAOPERATIVE CHOLANGIOGRAM;  Surgeon: Excell Seltzer, MD;  Location: [facility];  Service: General;  Laterality: N/A;   ENDOMETRIAL ABLATION  08/25/2009   HER OPTION    Family History:  Family History  Problem Relation Age of Onset   Hypertension Father    Obesity Father    Cancer Maternal Grandfather        colon   Sleep apnea Mother    Heart disease Paternal Grandfather    Colon cancer Neg Hx    Family Psychiatric  History: The patient reports a history of depression in her father and a history of anxiety in her mother.  She reports that her father committed suicide on May 21, 2019.  Dates she has a paternal cousin who overdosed on fentanyl in 2019 but she does not know whether this was an accidental overdose or suicide attempt. Tobacco Screening: Have you used any form of tobacco in the last 30 days? (Cigarettes, Smokeless Tobacco, Cigars, and/or Pipes): No Are you interested in  Tobacco Cessation Medications?: No, patient refused Counseled patient on smoking cessation including recognizing danger situations, developing coping skills and basic information about quitting provided: Refused/Declined practical counseling Social History:  Social History   Substance and Sexual Activity  Alcohol Use Yes   Alcohol/week: 1.0 standard drink   Types: 1 Glasses of wine per week   Comment: occasional glass of wine once per week     Social History   Substance and Sexual Activity  Drug Use No    Additional Social History: Marital status: Married Number of Years Married: 25 What types of issues is patient dealing with in the relationship?: none reported Are you sexually active?: No What is your sexual orientation?: heterosexual Has your sexual activity been affected by drugs, alcohol, medication, or emotional stress?: UTA Does patient have children?: Yes How many children?: 2 How is patient's relationship with their children?: 79 year old son and 71 year old daughter "good"                         Allergies:   Allergies  Allergen Reactions   Penicillins Rash    Happened in childhood, does not remember if it spread all over her body or not. Has patient had a PCN reaction causing immediate rash, facial/tongue/throat swelling, SOB or lightheadedness with hypotension: no Has patient had a PCN reaction causing severe rash involving mucus membranes or skin necrosis: no Has patient had a PCN reaction that required hospitalization no Has patient had a PCN reaction occurring within the last 10 years: no If all of the above answers are "NO", then may proceed with C   Lab Results:  Results for orders placed or performed during the hospital encounter of 05/04/21 (from the past 48 hour(s))  Acetaminophen level     Status: Abnormal   Collection Time: 05/04/21  2:28 PM  Result Value Ref Range   Acetaminophen (Tylenol), Serum <10 (L) 10 - 30 ug/mL    Comment:  (NOTE) Therapeutic concentrations vary significantly. A range of 10-30 ug/mL  may be an effective concentration for many patients. However, some  are best treated at concentrations outside of this range. Acetaminophen concentrations >150 ug/mL at 4 hours after ingestion  and >50 ug/mL at 12 hours after ingestion are often associated with  toxic reactions.  Performed at [facility], [place] [address], [place], [place] [address]   Salicylate level     Status: Abnormal   Collection Time: 05/04/21  2:28  PM  Result Value Ref Range   Salicylate Lvl <2.4 (L) 7.0 - 30.0 mg/dL    Comment: Performed at [facility], [place] [address] [place], [place] [address]  Comprehensive metabolic panel     Status: Abnormal   Collection Time: 05/04/21  4:28 PM  Result Value Ref Range   Sodium 138 135 - 145 mmol/L   Potassium 3.8 3.5 - 5.1 mmol/L   Chloride 107 98 - 111 mmol/L   CO2 25 22 - 32 mmol/L   Glucose, Bld 101 (H) 70 - 99 mg/dL    Comment: Glucose reference range applies only to samples taken after fasting for at least 8 hours.   BUN 9 6 - 20 mg/dL   Creatinine, Ser 0.80 0.44 - 1.00 mg/dL   Calcium 9.5 8.9 - 10.3 mg/dL   Total Protein 6.5 6.5 - 8.1 g/dL   Albumin 3.4 (L) 3.5 - 5.0 g/dL   AST 20 15 - 41 U/L   ALT 23 0 - 44 U/L   Alkaline Phosphatase 74 38 - 126 U/L   Total Bilirubin 0.6 0.3 - 1.2 mg/dL   GFR, Estimated >60 >60 mL/min    Comment: (NOTE) Calculated using the CKD-EPI Creatinine Equation (2021)    Anion gap 6 5 - 15    Comment: Performed at [facility], [place] [address] [place], [place] [address]  CBC with Differential     Status: None   Collection Time: 05/04/21  4:28 PM  Result Value Ref Range   WBC 9.0 4.0 - 10.5 K/uL   RBC 4.73 3.87 - 5.11 MIL/uL   Hemoglobin 13.6 12.0 - 15.0 g/dL   HCT 42.3 36.0 - 46.0 %   MCV 89.4 80.0 - 100.0 fL   MCH 28.8 26.0 - 34.0 pg   MCHC 32.2 30.0 - 36.0 g/dL   RDW 13.4 11.5 - 15.5 %    Platelets 280 150 - 400 K/uL   nRBC 0.0 0.0 - 0.2 %   Neutrophils Relative % 75 %   Neutro Abs 6.6 1.7 - 7.7 K/uL   Lymphocytes Relative 14 %   Lymphs Abs 1.3 0.7 - 4.0 K/uL   Monocytes Relative 10 %   Monocytes Absolute 0.9 0.1 - 1.0 K/uL   Eosinophils Relative 1 %   Eosinophils Absolute 0.1 0.0 - 0.5 K/uL   Basophils Relative 0 %   Basophils Absolute 0.0 0.0 - 0.1 K/uL   Immature Granulocytes 0 %   Abs Immature Granulocytes 0.03 0.00 - 0.07 K/uL    Comment: Performed at [facility], [place] [address], [place], [place] [address]  Magnesium     Status: None   Collection Time: 05/04/21  4:28 PM  Result Value Ref Range   Magnesium 2.2 1.7 - 2.4 mg/dL    Comment: Performed at [facility], [place] [address], [place], [place] [address]  Lipid panel     Status: Abnormal   Collection Time: 05/04/21  4:28 PM  Result Value Ref Range   Cholesterol 118 0 - 200 mg/dL   Triglycerides 109 <150 mg/dL   HDL 39 (L) >40 mg/dL   Total CHOL/HDL Ratio 3.0 RATIO   VLDL 22 0 - 40 mg/dL   LDL Cholesterol 57 0 - 99 mg/dL    Comment:        Total Cholesterol/HDL:CHD Risk Coronary Heart Disease Risk Table  Men   Women  1/2 Average Risk   3.4   3.3  Average Risk       5.0   4.4  2 X Average Risk   9.6   7.1  3 X Average Risk  23.4   11.0        Use the calculated Patient Ratio above and the CHD Risk Table to determine the patient's CHD Risk.        ATP III CLASSIFICATION (LDL):  <100     mg/dL   Optimal  100-129  mg/dL   Near or Above                    Optimal  130-159  mg/dL   Borderline  160-189  mg/dL   High  >190     mg/dL   Very High Performed at [place] [address] [place], [place] [address]   TSH     Status: None   Collection Time: 05/04/21  4:28 PM  Result Value Ref Range   TSH 2.115 0.350 - 4.500 uIU/mL    Comment: Performed by a 3rd Generation assay with a functional sensitivity of <=0.01  uIU/mL. Performed at [facility], [place] [address] [place], [place] [address]   Resp Panel by RT-PCR (Flu A&B, Covid) Nasopharyngeal Swab     Status: None   Collection Time: 05/04/21  4:37 PM   Specimen: Nasopharyngeal Swab; Nasopharyngeal(NP) swabs in vial transport medium  Result Value Ref Range   SARS Coronavirus 2 by RT PCR NEGATIVE NEGATIVE    Comment: (NOTE) SARS-CoV-2 target nucleic acids are NOT DETECTED.  The SARS-CoV-2 RNA is generally detectable in upper respiratory specimens during the acute phase of infection. The lowest concentration of SARS-CoV-2 viral copies this assay can detect is 138 copies/mL. A negative result does not preclude SARS-Cov-2 infection and should not be used as the sole basis for treatment or other patient management decisions. A negative result may occur with  improper specimen collection/handling, submission of specimen other than nasopharyngeal swab, presence of viral mutation(s) within the areas targeted by this assay, and inadequate number of viral copies(<138 copies/mL). A negative result must be combined with clinical observations, patient history, and epidemiological information. The expected result is Negative.  Fact Sheet for Patients:  EntrepreneurPulse.com.au  Fact Sheet for Healthcare Providers:  IncredibleEmployment.be  This test is no t yet approved or cleared by the [place] FDA and  has been authorized for detection and/or diagnosis of SARS-CoV-2 by FDA under an Emergency Use Authorization (EUA). This EUA will remain  in effect (meaning this test can be used) for the duration of the COVID-19 declaration under Section 564(b)(1) of the Act, 21 U.S.C.section 360bbb-3(b)(1), unless the authorization is terminated  or revoked sooner.       Influenza A by PCR NEGATIVE NEGATIVE   Influenza B by PCR NEGATIVE NEGATIVE    Comment: (NOTE) The Xpert Xpress SARS-CoV-2/FLU/RSV  plus assay is intended as an aid in the diagnosis of influenza from Nasopharyngeal swab specimens and should not be used as a sole basis for treatment. Nasal washings and aspirates are unacceptable for Xpert Xpress SARS-CoV-2/FLU/RSV testing.  Fact Sheet for Patients: EntrepreneurPulse.com.au  Fact Sheet for Healthcare Providers: IncredibleEmployment.be  This test is not yet approved or cleared by the [place] FDA and has been authorized for detection and/or diagnosis of SARS-CoV-2 by FDA under an Emergency Use Authorization (EUA). This EUA will remain in effect (meaning  this test can be used) for the duration of the COVID-19 declaration under Section 564(b)(1) of the Act, 21 U.S.C. section 360bbb-3(b)(1), unless the authorization is terminated or revoked.  Performed at [facility], [place] [address] [place], [place] [address]   Urine rapid drug screen (hosp performed)     Status: Abnormal   Collection Time: 05/04/21  5:55 PM  Result Value Ref Range   Opiates NONE DETECTED NONE DETECTED   Cocaine NONE DETECTED NONE DETECTED   Benzodiazepines POSITIVE (A) NONE DETECTED   Amphetamines NONE DETECTED NONE DETECTED   Tetrahydrocannabinol NONE DETECTED NONE DETECTED   Barbiturates NONE DETECTED NONE DETECTED    Comment: (NOTE) DRUG SCREEN FOR MEDICAL PURPOSES ONLY.  IF CONFIRMATION IS NEEDED FOR ANY PURPOSE, NOTIFY LAB WITHIN 5 DAYS.  LOWEST DETECTABLE LIMITS FOR URINE DRUG SCREEN Drug Class                     Cutoff (ng/mL) Amphetamine and metabolites    1000 Barbiturate and metabolites    200 Benzodiazepine                 532 Tricyclics and metabolites     300 Opiates and metabolites        300 Cocaine and metabolites        300 THC                            50 Performed at [facility], [place] [address] [place], [place] [address]   Acetaminophen level     Status: Abnormal   Collection Time:  05/04/21  8:15 PM  Result Value Ref Range   Acetaminophen (Tylenol), Serum <10 (L) 10 - 30 ug/mL    Comment: (NOTE) Therapeutic concentrations vary significantly. A range of 10-30 ug/mL  may be an effective concentration for many patients. However, some  are best treated at concentrations outside of this range. Acetaminophen concentrations >150 ug/mL at 4 hours after ingestion  and >50 ug/mL at 12 hours after ingestion are often associated with  toxic reactions.  Performed at [facility], [place] [address] [place],  [address]     Blood Alcohol level:  No results found for: [facility]  Metabolic Disorder Labs:  Lab Results  Component Value Date   HGBA1C 6.2 (H) 08/25/2018   MPG 131 08/25/2018   No results found for: PROLACTIN Lab Results  Component Value Date   CHOL 118 05/04/2021   TRIG 109 05/04/2021   HDL 39 (L) 05/04/2021   CHOLHDL 3.0 05/04/2021   VLDL 22 05/04/2021   LDLCALC 57 05/04/2021    Current Medications: Current Facility-Administered Medications  Medication Dose Route Frequency Provider Last Rate Last Admin   acetaminophen (TYLENOL) tablet 650 mg  650 mg Oral Q6H PRN Prescilla Sours, PA-C   650 mg at 05/05/21 1420   alum & mag hydroxide-simeth (MAALOX/MYLANTA) 200-200-20 MG/5ML suspension 30 mL  30 mL Oral Q4H PRN [name] W, PA-C       feeding supplement (ENSURE ENLIVE / ENSURE PLUS) liquid 237 mL  237 mL Oral BID BM [name] W, PA-C   237 mL at 05/05/21 1413   hydrOXYzine (ATARAX/VISTARIL) tablet 25 mg  25 mg Oral Q6H PRN Arthor Captain, MD       lisinopril (ZESTRIL) tablet 10 mg  10 mg Oral Daily Prescilla Sours, PA-C  loperamide (IMODIUM) capsule 2-4 mg  2-4 mg Oral PRN Arthor Captain, MD       LORazepam (ATIVAN) tablet 1 mg  1 mg Oral Q6H PRN Arthor Captain, MD       magnesium hydroxide (MILK OF MAGNESIA) suspension 30 mL  30 mL Oral Daily PRN [name] W, PA-C       multivitamin with minerals tablet 1 tablet  1  tablet Oral Daily Arthor Captain, MD   1 tablet at 05/05/21 1413   ondansetron (ZOFRAN-ODT) disintegrating tablet 4 mg  4 mg Oral Q6H PRN Arthor Captain, MD   4 mg at 05/05/21 1420   pantoprazole (PROTONIX) EC tablet 40 mg  40 mg Oral QHS Prescilla Sours, PA-C       [START ON 05/06/2021] thiamine tablet 100 mg  100 mg Oral Daily Arthor Captain, MD       verapamil (CALAN-SR) CR tablet 180 mg  180 mg Oral QHS [name] W, PA-C       PTA Medications: Medications Prior to Admission  Medication Sig Dispense Refill Last Dose   propranolol (INDERAL) 10 MG tablet Take 20 mg by mouth 3 (three) times daily. Patient reports that she takes this scheduled for anxiety.      ALPRAZolam (XANAX) 0.5 MG tablet Take 0.5 mg by mouth 3 (three) times daily.      clonazePAM (KLONOPIN) 1 MG tablet Take 1 mg by mouth 2 (two) times daily.      FLUoxetine (PROZAC) 20 MG capsule Take 60 mg by mouth daily.      lisinopril (ZESTRIL) 10 MG tablet Take 10 mg by mouth daily.      omeprazole (PRILOSEC) 20 MG capsule Take 20 mg by mouth at bedtime.       verapamil (VERELAN PM) 180 MG 24 hr capsule Take 180 mg by mouth at bedtime.  11    zolpidem (AMBIEN) 10 MG tablet Take 10 mg by mouth at bedtime. Takes an additional 5 mg if needed.       Musculoskeletal: Strength & Muscle Tone: within normal limits Gait & Station:  Mildly ataxic/mildly unsteady Patient leans: N/A            Psychiatric Specialty Exam:  Presentation  General Appearance: Appropriate for Environment; Other (comment) (unkempt)  Eye Contact:Fair  Speech:Clear and Coherent; Normal Rate  Speech Volume:Normal  Handedness: No data recorded  Mood and Affect  Mood:Anxious; Depressed; Hopeless  Affect:Congruent; Tearful   Thought Process  Thought Processes:Coherent  Duration of Psychotic Symptoms: No data recorded Past Diagnosis of Schizophrenia or Psychoactive disorder: No  Descriptions of  Associations:Circumstantial  Orientation:Full (Time, Place and Person)  Thought Content:Rumination  Hallucinations:Hallucinations: None  Ideas of Reference:None  Suicidal Thoughts:Suicidal Thoughts: No  Homicidal Thoughts:Homicidal Thoughts: No   Sensorium  Memory:Immediate Fair; Recent Fair; Remote Fair  Judgment:Impaired  Insight:Shallow   Executive Functions  Concentration:Fair  Attention Span:Fair  [place] of Knowledge:Good  Language:Good   Psychomotor Activity  Psychomotor Activity:Psychomotor Activity: Normal; Other (comment) (no tremor)   Assets  Assets:Communication Skills; Desire for Improvement; Financial Resources/Insurance; Housing; Resilience; Social Support   Sleep  Sleep:Sleep: Poor Number of Hours of Sleep: 2.5    Physical Exam: Physical Exam ROS Blood pressure (!) 118/58, pulse 63, temperature 98 F (36.7 C), temperature source Oral, resp. rate 18, height '5\' 4"'  (1.626 m), weight 101.6 kg, SpO2 100 %. Body mass index is 38.45 kg/m.  Treatment Plan Summary: Daily contact with  patient to assess and evaluate symptoms and progress in treatment and Medication management  Continue IVC status.  Second QPE by this Probation officer today.  Given recent overdose will continue to defer reinitiation of patient's prior psychotropic medications of propranolol, Xanax/clonazepam, fluoxetine and Ambien.  Patient has been placed on Ativan detox protocol to cover for any possible withdrawal symptoms from benzodiazepines with PRN lorazepam for CIWA scores greater than 10.  She is not displaying any signs or symptoms of benzodiazepine withdrawal at this time.  Vital signs this morning include BP of 118/58 and pulse of 63.  She is afebrile.  Will consider reinitiation of alprazolam and fluoxetine as patient is farther out from her overdose.  Continue outpatient lisinopril and verapamil for hypertension.  See MAR for additional details.  Patient had positive  response to ECT in 2008 and would like to try ECT again at [facility].  Patient states that her husband is currently working to facilitate transfer for patient to [facility] inpatient psychiatry where she can receive ECT.  Social work will obtain signature of the necessary consent to speak with husband and obtain additional information.  I discussed with patient the possibility of submitting a referral to [facility] for ECT treatment there.  Patient prefers to receive ECT treatment at [place] at this time.  Observation Level/Precautions:  15 minute checks  Laboratory:  CBC Chemistry Profile HbAIC HCG UDS Lipid panel, TSH  Available lab results reviewed.  CMP revealed glucose of 101, albumin of 3.4 and otherwise WNL.  Lipid profile revealed HDL 39 and otherwise WNL.  CBC and differential were WNL.  Acetaminophen level was <10.  Salicylate level was <7.0.ASH was 2.115.  Urine drug screen was positive for benzodiazepines.  Hemoglobin A1c was performed but results not yet available.  Psychotherapy:    Medications: See MAR  Consultations:    Discharge Concerns:    Estimated LOS: 5 to 7 days  Other:     Physician Treatment Plan for Primary Diagnosis: MDD (major depressive disorder), recurrent severe, without psychosis ([place]) Long Term Goal(s): Improvement in symptoms so as ready for discharge  Short Term Goals: Ability to identify changes in lifestyle to reduce recurrence of condition will improve, Ability to verbalize feelings will improve, Ability to disclose and discuss suicidal ideas, Ability to demonstrate self-control will improve, Ability to identify and develop effective coping behaviors will improve, Compliance with prescribed medications will improve, and Ability to identify triggers associated with substance abuse/mental health issues will improve  Physician Treatment Plan for Secondary Diagnosis: Principal Problem:   MDD (major depressive disorder), recurrent severe, without psychosis  ([place]) Active Problems:   Generalized anxiety disorder  Long Term Goal(s): Improvement in symptoms so as ready for discharge  Short Term Goals: Ability to identify changes in lifestyle to reduce recurrence of condition will improve, Ability to verbalize feelings will improve, Ability to disclose and discuss suicidal ideas, Ability to demonstrate self-control will improve, Ability to identify and develop effective coping behaviors will improve, Compliance with prescribed medications will improve, and Ability to identify triggers associated with substance abuse/mental health issues will improve  I certify that inpatient services furnished can reasonably be expected to improve the patient's condition.    Arthor Captain, MD 6/10/20223:54 PM
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
## 2021-05-05 NOTE — Progress Notes (Signed)
Adult Psychoeducational Group Note  Date:  05/05/2021 Time:  12:01 PM  Group Topic/Focus:  Managing Feelings:   The focus of this group is to identify what feelings patients have difficulty handling and develop a plan to handle them in a healthier way upon discharge.  Participation Level:  Did Not Attend Tyrell Antonio [facility] 05/05/2021, 12:01 PM

## 2021-05-05 NOTE — ED Notes (Signed)
Called [facility] to transport pt to [facility].

## 2021-05-05 NOTE — Tx Team (Signed)
Interdisciplinary Treatment and Diagnostic Plan Update  05/05/2021 Time of Session:  Alexis Wall MRN: [id number]  Principal Diagnosis: MDD (major depressive disorder), recurrent severe, without psychosis ([place])  Secondary Diagnoses: Principal Problem:   MDD (major depressive disorder), recurrent severe, without psychosis ([place]) Active Problems:   Generalized anxiety disorder   Current Medications:  Current Facility-Administered Medications  Medication Dose Route Frequency Provider Last Rate Last Admin   acetaminophen (TYLENOL) tablet 650 mg  650 mg Oral Q6H PRN Prescilla Sours, PA-C   650 mg at 05/05/21 1420   alum & mag hydroxide-simeth (MAALOX/MYLANTA) 200-200-20 MG/5ML suspension 30 mL  30 mL Oral Q4H PRN Prescilla Sours, PA-C       feeding supplement (ENSURE ENLIVE / ENSURE PLUS) liquid 237 mL  237 mL Oral BID BM [name] W, PA-C   237 mL at 05/05/21 1413   hydrOXYzine (ATARAX/VISTARIL) tablet 25 mg  25 mg Oral Q6H PRN Arthor Captain, MD       lisinopril (ZESTRIL) tablet 10 mg  10 mg Oral Daily Lovena Le, Cody W, PA-C       loperamide (IMODIUM) capsule 2-4 mg  2-4 mg Oral PRN Arthor Captain, MD       LORazepam (ATIVAN) tablet 1 mg  1 mg Oral Q6H PRN Arthor Captain, MD       magnesium hydroxide (MILK OF MAGNESIA) suspension 30 mL  30 mL Oral Daily PRN [name] W, PA-C       multivitamin with minerals tablet 1 tablet  1 tablet Oral Daily Arthor Captain, MD   1 tablet at 05/05/21 1413   ondansetron (ZOFRAN-ODT) disintegrating tablet 4 mg  4 mg Oral Q6H PRN Arthor Captain, MD   4 mg at 05/05/21 1420   pantoprazole (PROTONIX) EC tablet 40 mg  40 mg Oral QHS [name] W, PA-C       [START ON 05/06/2021] thiamine tablet 100 mg  100 mg Oral Daily Arthor Captain, MD       verapamil (CALAN-SR) CR tablet 180 mg  180 mg Oral QHS [name] W, PA-C       PTA Medications: Medications Prior to Admission  Medication Sig Dispense Refill Last Dose   propranolol (INDERAL) 10 MG tablet  Take 20 mg by mouth 3 (three) times daily. Patient reports that she takes this scheduled for anxiety.      ALPRAZolam (XANAX) 0.5 MG tablet Take 0.5 mg by mouth 3 (three) times daily.      clonazePAM (KLONOPIN) 1 MG tablet Take 1 mg by mouth 2 (two) times daily.      FLUoxetine (PROZAC) 20 MG capsule Take 60 mg by mouth daily.      lisinopril (ZESTRIL) 10 MG tablet Take 10 mg by mouth daily.      omeprazole (PRILOSEC) 20 MG capsule Take 20 mg by mouth at bedtime.       verapamil (VERELAN PM) 180 MG 24 hr capsule Take 180 mg by mouth at bedtime.  11    zolpidem (AMBIEN) 10 MG tablet Take 10 mg by mouth at bedtime. Takes an additional 5 mg if needed.       Patient Stressors: Financial difficulties  Patient Strengths: Ability for insight Motivation for treatment/growth Supportive family/friends  Treatment Modalities: Medication Management, Group therapy, Case management,  1 to 1 session with clinician, Psychoeducation, Recreational therapy.   Physician Treatment Plan for Primary Diagnosis: MDD (major depressive disorder), recurrent severe, without psychosis ([place]) Long Term  Goal(s): Improvement in symptoms so as ready for discharge   Short Term Goals: Ability to identify changes in lifestyle to reduce recurrence of condition will improve Ability to verbalize feelings will improve Ability to disclose and discuss suicidal ideas Ability to demonstrate self-control will improve Ability to identify and develop effective coping behaviors will improve Compliance with prescribed medications will improve Ability to identify triggers associated with substance abuse/mental health issues will improve  Medication Management: Evaluate patient's response, side effects, and tolerance of medication regimen.  Therapeutic Interventions: 1 to 1 sessions, Unit Group sessions and Medication administration.  Evaluation of Outcomes: Not Met  Physician Treatment Plan for Secondary Diagnosis: Principal  Problem:   MDD (major depressive disorder), recurrent severe, without psychosis ([place]) Active Problems:   Generalized anxiety disorder  Long Term Goal(s): Improvement in symptoms so as ready for discharge   Short Term Goals: Ability to identify changes in lifestyle to reduce recurrence of condition will improve Ability to verbalize feelings will improve Ability to disclose and discuss suicidal ideas Ability to demonstrate self-control will improve Ability to identify and develop effective coping behaviors will improve Compliance with prescribed medications will improve Ability to identify triggers associated with substance abuse/mental health issues will improve     Medication Management: Evaluate patient's response, side effects, and tolerance of medication regimen.  Therapeutic Interventions: 1 to 1 sessions, Unit Group sessions and Medication administration.  Evaluation of Outcomes: Not Met   RN Treatment Plan for Primary Diagnosis: MDD (major depressive disorder), recurrent severe, without psychosis ([place]) Long Term Goal(s): Knowledge of disease and therapeutic regimen to maintain health will improve  Short Term Goals: Ability to verbalize feelings will improve, Ability to identify and develop effective coping behaviors will improve, and Compliance with prescribed medications will improve  Medication Management: RN will administer medications as ordered by provider, will assess and evaluate patient's response and provide education to patient for prescribed medication. RN will report any adverse and/or side effects to prescribing provider.  Therapeutic Interventions: 1 on 1 counseling sessions, Psychoeducation, Medication administration, Evaluate responses to treatment, Monitor vital signs and CBGs as ordered, Perform/monitor CIWA, COWS, AIMS and Fall Risk screenings as ordered, Perform wound care treatments as ordered.  Evaluation of Outcomes: Not Met   LCSW Treatment Plan for  Primary Diagnosis: MDD (major depressive disorder), recurrent severe, without psychosis ([place]) Long Term Goal(s): Safe transition to appropriate next level of care at discharge, Engage patient in therapeutic group addressing interpersonal concerns.  Short Term Goals: Engage patient in aftercare planning with referrals and resources, Facilitate acceptance of mental health diagnosis and concerns, and Identify triggers associated with mental health/substance abuse issues  Therapeutic Interventions: Assess for all discharge needs, 1 to 1 time with Social worker, Explore available resources and support systems, Assess for adequacy in community support network, Educate family and significant other(s) on suicide prevention, Complete Psychosocial Assessment, Interpersonal group therapy.  Evaluation of Outcomes: Not Met   Progress in Treatment: Attending groups: No. Participating in groups: No. Taking medication as prescribed: Yes. Toleration medication: Yes. Family/Significant other contact made: No, will contact:  husband Patient understands diagnosis: Yes. and No. Discussing patient identified problems/goals with staff: Yes. Medical problems stabilized or resolved: Yes. Denies suicidal/homicidal ideation: No. Issues/concerns per patient self-inventory: No. Other: None  New problem(s) identified: No, Describe:  None  New Short Term/Long Term Goal(s):medication stabilization, elimination of SI thoughts, development of comprehensive mental wellness plan.   Patient Goals:  "to get to [facility] and get ECT"  Discharge Plan  or Barriers: Patient recently admitted. CSW will continue to follow and assess for appropriate referrals and possible discharge planning.   Reason for Continuation of Hospitalization: Anxiety Depression Medication stabilization Suicidal ideation  Estimated Length of Stay: 3-5 days  Attendees: Patient: Alexis Wall 05/05/2021 2:57 PM  Physician:  05/05/2021 2:57 PM   Nursing:  05/05/2021 2:57 PM  RN Care Manager: 05/05/2021 2:57 PM  Social Worker: Toney Reil, [place] 05/05/2021 2:57 PM  Recreational Therapist:  05/05/2021 2:57 PM  Other:  05/05/2021 2:57 PM  Other:  05/05/2021 2:57 PM  Other: 05/05/2021 2:57 PM    Scribe for Treatment Team: [name], [place] 05/05/2021 2:57 PM

## 2021-05-05 NOTE — [facility] Suicide Risk Assessment (Signed)
[facility] Admission Suicide Risk Assessment   Nursing information obtained from:  Patient Demographic factors:  Caucasian, Unemployed Current Mental Status:  Suicidal ideation indicated by patient Loss Factors:  Decrease in vocational status Historical Factors:  Anniversary of important loss, Prior suicide attempts, Impulsivity Risk Reduction Factors:  Living with another person, especially a relative, Positive social support  Total Time spent with patient: 30 minutes Principal Problem: MDD (major depressive disorder), recurrent severe, without psychosis ([place]) Diagnosis:  Principal Problem:   MDD (major depressive disorder), recurrent severe, without psychosis ([place]) Active Problems:   Generalized anxiety disorder  Subjective Data: Medical record reviewed.  Patient's case discussed in detail with members of the treatment team.  I met with and evaluated the patient on the unit today. Alexis Wall is a 55 year old female with a history of major depressive disorder and anxiety who presented to [facility] ED on 05/04/21 following an overdose on Prozac, Ambien and Xanax in a suicide attempt.  There are conflicting numbers of tablets documented as having been taken in the overdose in the EMR.  The patient reported on assessment in the ED that she planned out the attempt and wrote suicide notes to family members.  She was transferred to [facility] for further treatment of depression.  On interview with me this morning, the patient is tired and a bit groggy from her recent benzodiazepine ingestion and is a suboptimal historian.  She presents with depressed and anxious mood congruent tearful affect and circumstantial thought processes.  She reports that she has had worsening anxiety since the end of March 2022 and then developed depression.  She states that she became tired of taking medications and not getting relief and that her depressive symptoms had worsened in the last 2 days "I felt like I had to had it."  Patient states  that she took "a bottle of Xanax and a bottle of Ambien because I wanted all this pain, depression and anxiety to go away."  Reportedly she had just been seen by a psychiatrist at [facility] for consultation regarding her treatment and the possibility of receiving ECT at [facility].  During that consultation clonazepam which had recently been started for anxiety was changed back to alprazolam, her fluoxetine was increased and she was continued on her Ambien.  Most recent outpatient medication doses were fluoxetine 60 mg daily, alprazolam 0.5 mg 3 times daily, Ambien 10 to 15 mg at bedtime as needed for sleep and propranolol 3 times daily.  The patient states that clonazepam had recently been started at a dose of 1 twice daily but she felt she was becoming more depressed on it so she was changed back to alprazolam when she was seen at [facility] 2 days ago.  The patient reports that her mood today is depressed and anxious.  She denies suicidal ideation and perseverates on wanting discharge.  He states that she feels she will always be depressed and she is concerned that her family is upset with her for being this way.  She denies any current symptoms of benzodiazepine withdrawal.  Patient states that her husband is currently working to facilitate transfer for patient to [facility] inpatient psychiatry where she can receive ECT.  Social work will obtain signature of the necessary consent to speak with husband and obtain additional information.  The patient reports prior diagnoses of major depression and generalized anxiety disorder.  She reports her first episode of depression was in 2008 and she was admitted at that time to the inpatient psychiatric hospital  at [facility] and underwent ECT with improvement after 5 treatments.  She states that she did well until earlier in 2022 when the depression returned.  She denies any prior history of suicide attempts.  The patient reports a history of depression in her father and a history of anxiety in  her mother.  She reports that her father committed suicide on May 21, 2019.  Dates she has a paternal cousin who overdosed on fentanyl in 2019 but she does not know whether this was an accidental overdose or suicide attempt.  She denies any history of alcohol or drug use.  Continued Clinical Symptoms:  Alcohol Use Disorder Identification Test Final Score (AUDIT): 0 The "Alcohol Use Disorders Identification Test", Guidelines for Use in Primary Care, Second Edition.  World Pharmacologist [facility]). Score between 0-7:  no or low risk or alcohol related problems. Score between 8-15:  moderate risk of alcohol related problems. Score between 16-19:  high risk of alcohol related problems. Score 20 or above:  warrants further diagnostic evaluation for alcohol dependence and treatment.   CLINICAL FACTORS:   Severe Anxiety and/or Agitation Depression:   Anhedonia Hopelessness Impulsivity Insomnia More than one psychiatric diagnosis Previous Psychiatric Diagnoses and Treatments   Musculoskeletal: Strength & Muscle Tone: within normal limits Gait & Station:  Mildly ataxic/unsteady Patient leans: N/A  Psychiatric Specialty Exam:  Presentation  General Appearance: Appropriate for Environment; Other (comment) (unkempt)  Eye Contact:Fair  Speech:Clear and Coherent; Normal Rate  Speech Volume:Normal  Handedness: No data recorded  Mood and Affect  Mood:Anxious; Depressed; Hopeless  Affect:Congruent; Tearful   Thought Process  Thought Processes:Coherent  Descriptions of Associations:Circumstantial  Orientation:Full (Time, Place and Person)  Thought Content:Rumination  History of Schizophrenia/Schizoaffective disorder:No  Duration of Psychotic Symptoms:No data recorded Hallucinations:Hallucinations: None  Ideas of Reference:None  Suicidal Thoughts:Suicidal Thoughts: No  Homicidal Thoughts:Homicidal Thoughts: No   Sensorium  Memory:Immediate Fair; Recent Fair;  Remote Fair  Judgment:Impaired  Insight:Shallow   Executive Functions  Concentration:Fair  Attention Span:Fair  [place] of Knowledge:Good  Language:Good   Psychomotor Activity  Psychomotor Activity:Psychomotor Activity: Normal; Other (comment) (no tremor)   Assets  Assets:Communication Skills; Desire for Improvement; Financial Resources/Insurance; Housing; Resilience; Social Support   Sleep  Sleep:Sleep: Poor Number of Hours of Sleep: 2.5    Physical Exam: Physical Exam Vitals and nursing note reviewed.  Constitutional:      General: She is not in acute distress.    Appearance: She is not diaphoretic.  HENT:     Head: Normocephalic and atraumatic.  Pulmonary:     Effort: Pulmonary effort is normal.  Neurological:     General: No focal deficit present.     Mental Status: She is alert and oriented to person, place, and time.   Review of Systems  Constitutional:  Positive for malaise/fatigue. Negative for chills, diaphoresis and fever.  Respiratory:  Negative for cough and shortness of breath.   Cardiovascular:  Negative for chest pain and palpitations.  Gastrointestinal:  Negative for constipation, diarrhea, nausea and vomiting.  Musculoskeletal: Negative.   Skin: Negative.   Neurological:  Negative for tremors and seizures.  Psychiatric/Behavioral:  Positive for depression and suicidal ideas. Negative for hallucinations. The patient is nervous/anxious and has insomnia.    Blood pressure (!) 118/58, pulse 63, temperature 98 F (36.7 C), temperature source Oral, resp. rate 18, height '5\' 4"'  (1.626 m), weight 101.6 kg, SpO2 100 %. Body mass index is 38.45 kg/m.   COGNITIVE FEATURES THAT CONTRIBUTE TO RISK:  Thought constriction (tunnel vision)    SUICIDE RISK:   Severe:  Frequent, intense, and enduring suicidal ideation, specific plan, no subjective intent, but some objective markers of intent (i.e., choice of lethal method), the method is  accessible, some limited preparatory behavior, evidence of impaired self-control, severe dysphoria/symptomatology, multiple risk factors present, and few if any protective factors, particularly a lack of social support.  PLAN OF CARE: The patient has been admitted to the 300 inpatient unit on IVC.  We will continue every 15-minute observation level.  Encouraged participation in group therapy and therapeutic milieu.  Available lab results reviewed.  CMP revealed glucose of 101, albumin of 3.4 and otherwise WNL.  Lipid profile revealed HDL 39 and otherwise WNL.  CBC and differential were WNL.  Acetaminophen level was <10.  Salicylate level was <7.0.ASH was 2.115.  Urine drug screen was positive for benzodiazepines.  Hemoglobin A1c was performed but results not yet available.  Given recent overdose will continue to defer reinitiation of patient's prior psychotropic medications of propranolol, Xanax/clonazepam, fluoxetine and Ambien.  Patient has been placed on Ativan detox protocol to cover for any possible withdrawal symptoms from benzodiazepines with PRN lorazepam for CIWA scores greater than 10.  She is not displaying any signs or symptoms of benzodiazepine withdrawal at this time.  Vital signs this morning include BP of 118/58 and pulse of 63.  She is afebrile.  Will consider reinitiation of alprazolam and fluoxetine as patient is farther out from her overdose.  Continue outpatient lisinopril and verapamil for hypertension.  See MAR for additional details.  Patient had positive response to ECT in 2008 and would like to try ECT again at [facility].  Patient states that her husband is currently working to facilitate transfer for patient to [facility] inpatient psychiatry where she can receive ECT.  Social work will obtain signature of the necessary consent to speak with husband and obtain additional information.  I discussed with patient the possibility of submitting a referral to [facility] for ECT treatment there.   Patient prefers to receive ECT treatment at [place] at this time.  Anticipated length of stay 5 to 7 days.   I certify that inpatient services furnished can reasonably be expected to improve the patient's condition.   Arthor Captain, MD 05/05/2021, 3:24 PM

## 2021-05-05 NOTE — ED Notes (Signed)
Spoke with Sherle Poe (husband) and updated him on plan of care.

## 2021-05-05 NOTE — Progress Notes (Signed)
Admission Note:  Alexis Wall  05/05/2021 MRN: [id number] Patient presents to [facility] under IVC from [facility] following a suicide attempt by overdose. Patient presents with sad and depressed affect at time of assessment. Patient reports there are some changes with her husband's job that is affecting them financially. Patient also reports feeling stress related to her daughter dealing with anxiety and going off to college in the fall. Patient states that her father completed suicide 2 years ago and the anniversary of his death is approaching soon. Patient is still positive for passive SI at this time but contracts for safety. Patient denies HI/AVH at this time.

## 2021-05-06 DIAGNOSIS — F411 Generalized anxiety disorder: Secondary | ICD-10-CM

## 2021-05-06 LAB — HEMOGLOBIN A1C
Hgb A1c MFr Bld: 6 % — ABNORMAL HIGH (ref 4.8–5.6)
Mean Plasma Glucose: 126 mg/dL

## 2021-05-06 MED ORDER — FLUOXETINE HCL 20 MG PO CAPS
60.0000 mg | ORAL_CAPSULE | Freq: Every day | ORAL | Status: DC
  Administered 2021-05-06 – 2021-05-09 (×4): 60 mg via ORAL
  Filled 2021-05-06 (×5): qty 3

## 2021-05-06 MED ORDER — TRAZODONE HCL 50 MG PO TABS: 50.0000 mg | ORAL_TABLET | Freq: Every evening | ORAL | Status: DC | PRN

## 2021-05-06 MED ORDER — TRAZODONE HCL 50 MG PO TABS
50.0000 mg | ORAL_TABLET | Freq: Every evening | ORAL | Status: DC | PRN
  Administered 2021-05-06 – 2021-05-09 (×8): 50 mg via ORAL
  Filled 2021-05-06 (×12): qty 1

## 2021-05-06 MED ORDER — ALPRAZOLAM 0.5 MG PO TABS
0.5000 mg | ORAL_TABLET | Freq: Three times a day (TID) | ORAL | Status: DC | PRN
  Administered 2021-05-08 – 2021-05-15 (×13): 0.5 mg via ORAL
  Filled 2021-05-06 (×13): qty 1

## 2021-05-06 MED ORDER — MELATONIN 5 MG PO TABS
10.0000 mg | ORAL_TABLET | Freq: Every day | ORAL | Status: DC
  Administered 2021-05-06 – 2021-05-10 (×5): 10 mg via ORAL
  Filled 2021-05-06 (×8): qty 2

## 2021-05-06 MED ORDER — PROPRANOLOL HCL 20 MG PO TABS
20.0000 mg | ORAL_TABLET | Freq: Three times a day (TID) | ORAL | Status: DC
  Administered 2021-05-06 – 2021-05-07 (×3): 20 mg via ORAL
  Filled 2021-05-06 (×9): qty 1

## 2021-05-06 NOTE — Progress Notes (Signed)
Adult Psychoeducational Group Note  Date:  05/06/2021 Time:  10:09 AM  Group Topic/Focus:  Goals Group:   The focus of this group is to help patients establish daily goals to achieve during treatment and discuss how the patient can incorporate goal setting into their daily lives to aide in recovery.  Participation Level:  Active  Participation Quality:  Appropriate and Attentive  Affect:  Anxious and Appropriate  Cognitive:  Appropriate and Disorganized  Insight: Appropriate and Good  Engagement in Group:  Engaged  Modes of Intervention:  Discussion  Additional Comments:  Pt has a goal of doing some daily self care as well as trying to set up outside therapy.   Alexis Wall 05/06/2021, 10:09 AM

## 2021-05-06 NOTE — Progress Notes (Signed)
  D:  Pt presents with moderate anxiety and depression.  Pt complained of nausea and was medicated with 4 mg Zofran PRN per MAR.  Pt denies SI/HI, and verbally contracts for safety.  Pt denies AVH.  A:  Lab/Vitals being monitored; Medication education provided; Pt encouraged to communicate concerns.  R:  Pt remains safe with q15 minute safety checks.  Will continue POC.     05/05/21 2120  Psych Admission Type (Psych Patients Only)  Admission Status Involuntary  Psychosocial Assessment  Patient Complaints Anxiety;Appetite decrease;Decreased concentration;Worrying;Depression  Eye Contact Fair  Facial Expression Anxious;Sad;Sullen  Affect Anxious;Preoccupied  Soil scientist;Soft  Interaction Assertive  Motor Activity Slow;Unsteady  Appearance/Hygiene In scrubs  Behavior Characteristics Cooperative;Unable to participate  Mood Depressed;Anxious  Thought Process  Coherency WDL  Content Blaming self  Delusions None reported or observed  Perception WDL  Hallucination None reported or observed  Judgment Poor  Confusion None  Danger to Self  Current suicidal ideation? Passive  Self-Injurious Behavior No self-injurious ideation or behavior indicators observed or expressed   Agreement Not to Harm Self Yes  Description of Agreement Verbal contract  Danger to Others  Danger to Others None reported or observed

## 2021-05-06 NOTE — Progress Notes (Signed)
Adult Psychoeducational Group Note  Date:  05/06/2021 Time:  10:43 PM  Group Topic/Focus:  Wrap-Up Group:   The focus of this group is to help patients review their daily goal of treatment and discuss progress on daily workbooks.  Participation Level:  Active  Participation Quality:  Appropriate and Attentive  Affect:  Appropriate  Cognitive:  Alert and Appropriate  Insight: Appropriate  Engagement in Group:  Supportive  Modes of Intervention:  Discussion  Additional Comments:  Pt articulated that her goal for today was to go outside and to the cafeteria. These goals were accomplished. Pt stated she did talk with social worker and medical doctor about her care. Pt conveyed she made a phone call to her husband and reported relationship with her family and support system was very good. Pt reported she took all medications provided and attended all meal services with 100% intake. Pt said her appetite was improving today. Pt evaluated her sleep last night as good. Pt verbalized she felt good about herself and rated her overall day a 7 out of 10 on this date. Pt articulated she had no physical pain. Pt denies no auditory or visual hallucinations or thoughts of harming herself or others. Pt said she would alert staff if anything changed. End of Wrap-Up Group progress report.            Trinna Post 05/06/2021, 10:43 PM

## 2021-05-06 NOTE — Progress Notes (Signed)
[facility] MD Progress Note  05/06/2021 11:41 AM Alexis Wall  MRN:  [id number] Subjective:  "I need my Prozac, I am so depressed. I haven't had my Prozac in 2 days. I am ready to go to [facility] for ECT. My husband has been working with [place] and here to get me there."   Objective:  Alexis Wall is a 55 year old female with a history of major depressive disorder and anxiety who presented to [facility] ED on 05/04/21 following an overdose on Prozac, Ambien and Xanax in a suicide attempt.  There are conflicting numbers of tablets documented as having been taken in the overdose in the EMR.  The patient reported on assessment in the ED that she planned out the attempt and wrote suicide notes to family members.  She was transferred to [facility] for further treatment of depression.  Evaluation on the unit: Patient was seen and evaluated, chart reviewed and case discussed with the treatment team. Patient stated she is depressed and anxious, rating both as 10/10 on the severity scale. She stated she has not had her Prozac in 2 days and feels that her depression is getting worse. She appears sad with a flat affect. She stated her husband is working to get her transferred to [facility] inpatient for ECT. She had ECT in 2008 and she stated it worked. She has been seeing a psychiatrist at [facility] who referred her for ECT and increased Prozac to 60 mg daily. She also takes propranolol 20 mg TID for anxiety and alprazolam 0.5 mg TID (per PDMP) for anxiety. We will restart these 3 medications today. She stated she did not sleep that well without her Ambien. Patient was educated that Ambien is not routinely used in the hospital and we will start Trazodone for sleep. She slept 5.75 hours last night. She reported a fair appetite. She went to group today but stated "I didn't stay because their problems are so different than mine." Patient was encouraged to try and attend the unit activities. She denies SI/HI/AVH, paranoia and delusions. She is able to  contract for safety on the unit. Will continue to monitor.     Principal Problem: MDD (major depressive disorder), recurrent severe, without psychosis ([place]) Diagnosis: Principal Problem:   MDD (major depressive disorder), recurrent severe, without psychosis ([place]) Active Problems:   Generalized anxiety disorder  Total Time spent with patient:  25 minutes  Past Psychiatric History: See H&P  Past Medical History:  Past Medical History:  Diagnosis Date   Anemia    Arthritis    pains in knee   Bipolar disorder ([place])    per Dr D.Patterson's office note 07/08/2007   Depression    [facility]   Diabetes mellitus    GESTATIONAL-not now   GERD (gastroesophageal reflux disease)    Hypertension    Morbid obesity ([place])    Polyp of colon    Sleep apnea    Wears CPAP    Past Surgical History:  Procedure Laterality Date   ABDOMINAL HYSTERECTOMY  03/21/2010   TAH   BREATH TEK H PYLORI  08/05/2012   Procedure: BREATH TEK H PYLORI;  Surgeon: Pedro Earls, MD;  Location: [facility];  Service: General;  Laterality: N/A;  [place]  01/26/1999, 09/30/2002   X2.Marland Kitchen WITH BTSP   CHOLECYSTECTOMY N/A 08/27/2018   Procedure: LAPAROSCOPIC CHOLECYSTECTOMY WITH INTRAOPERATIVE CHOLANGIOGRAM;  Surgeon: Excell Seltzer, MD;  Location: [facility];  Service: General;  Laterality: N/A;   ENDOMETRIAL ABLATION  08/25/2009   HER OPTION    Family History:  Family History  Problem Relation Age of Onset   Hypertension Father    Obesity Father    Cancer Maternal Grandfather        colon   Sleep apnea Mother    Heart disease Paternal Grandfather    Colon cancer Neg Hx    Family Psychiatric  History: See H&P Social History:  Social History   Substance and Sexual Activity  Alcohol Use Yes   Alcohol/week: 1.0 standard drink   Types: 1 Glasses of wine per week   Comment: occasional glass of wine once per week     Social History   Substance and Sexual Activity  Drug Use No     Social History   Socioeconomic History   Marital status: Married    Spouse name: Not on file   Number of children: Not on file   Years of education: Not on file   Highest education level: Not on file  Occupational History   Not on file  Tobacco Use   Smoking status: Former    Pack years: 0.00    Types: Cigarettes    Quit date: 05/03/1979    Years since quitting: 42.0   Smokeless tobacco: Never  Vaping Use   Vaping Use: Never used  Substance and Sexual Activity   Alcohol use: Yes    Alcohol/week: 1.0 standard drink    Types: 1 Glasses of wine per week    Comment: occasional glass of wine once per week   Drug use: No   Sexual activity: Yes    Comment: TAH  Other Topics Concern   Not on file  Social History Narrative   Not on file   Social Determinants of Health   Financial Resource Strain: Not on file  Food Insecurity: Not on file  Transportation Needs: Not on file  Physical Activity: Not on file  Stress: Not on file  Social Connections: Not on file   Additional Social History:                         Sleep: Fair  Appetite:  Fair  Current Medications: Current Facility-Administered Medications  Medication Dose Route Frequency Provider Last Rate Last Admin   acetaminophen (TYLENOL) tablet 650 mg  650 mg Oral Q6H PRN Prescilla Sours, PA-C   650 mg at 05/05/21 1420   ALPRAZolam (XANAX) tablet 0.5 mg  0.5 mg Oral TID PRN Ethelene Hal, NP       alum & mag hydroxide-simeth (MAALOX/MYLANTA) 200-200-20 MG/5ML suspension 30 mL  30 mL Oral Q4H PRN [name] W, PA-C       feeding supplement (ENSURE ENLIVE / ENSURE PLUS) liquid 237 mL  237 mL Oral BID BM [name] W, PA-C   237 mL at 05/05/21 1413   FLUoxetine (PROZAC) capsule 60 mg  60 mg Oral Daily Ethelene Hal, NP       hydrOXYzine (ATARAX/VISTARIL) tablet 25 mg  25 mg Oral Q6H PRN Arthor Captain, MD   25 mg at 05/05/21 2120   lisinopril (ZESTRIL) tablet 10 mg  10 mg Oral Daily [name] W, PA-C   10 mg at 05/06/21 0931   loperamide (IMODIUM) capsule 2-4 mg  2-4 mg Oral PRN Arthor Captain, MD       magnesium hydroxide (MILK OF MAGNESIA) suspension 30 mL  30 mL Oral Daily PRN Prescilla Sours, PA-C  multivitamin with minerals tablet 1 tablet  1 tablet Oral Daily Arthor Captain, MD   1 tablet at 05/06/21 0900   ondansetron (ZOFRAN-ODT) disintegrating tablet 4 mg  4 mg Oral Q6H PRN Arthor Captain, MD   4 mg at 05/05/21 2116   pantoprazole (PROTONIX) EC tablet 40 mg  40 mg Oral QHS [name] W, PA-C   40 mg at 05/05/21 2117   propranolol (INDERAL) tablet 20 mg  20 mg Oral TID Ethelene Hal, NP       thiamine tablet 100 mg  100 mg Oral Daily Arthor Captain, MD   100 mg at 05/06/21 0931   verapamil (CALAN-SR) CR tablet 180 mg  180 mg Oral QHS Prescilla Sours, PA-C   180 mg at 05/05/21 2117    Lab Results:  Results for orders placed or performed during the hospital encounter of 05/04/21 (from the past 48 hour(s))  Acetaminophen level     Status: Abnormal   Collection Time: 05/04/21  2:28 PM  Result Value Ref Range   Acetaminophen (Tylenol), Serum <10 (L) 10 - 30 ug/mL    Comment: (NOTE) Therapeutic concentrations vary significantly. A range of 10-30 ug/mL  may be an effective concentration for many patients. However, some  are best treated at concentrations outside of this range. Acetaminophen concentrations >150 ug/mL at 4 hours after ingestion  and >50 ug/mL at 12 hours after ingestion are often associated with  toxic reactions.  Performed at [facility], [place] [address], [place], [place] [address]   Salicylate level     Status: Abnormal   Collection Time: 05/04/21  2:28 PM  Result Value Ref Range   Salicylate Lvl <2.4 (L) 7.0 - 30.0 mg/dL    Comment: Performed at [facility], [place] [address], [place], [place] [address]  Comprehensive metabolic panel     Status: Abnormal   Collection Time: 05/04/21  4:28 PM   Result Value Ref Range   Sodium 138 135 - 145 mmol/L   Potassium 3.8 3.5 - 5.1 mmol/L   Chloride 107 98 - 111 mmol/L   CO2 25 22 - 32 mmol/L   Glucose, Bld 101 (H) 70 - 99 mg/dL    Comment: Glucose reference range applies only to samples taken after fasting for at least 8 hours.   BUN 9 6 - 20 mg/dL   Creatinine, Ser 0.80 0.44 - 1.00 mg/dL   Calcium 9.5 8.9 - 10.3 mg/dL   Total Protein 6.5 6.5 - 8.1 g/dL   Albumin 3.4 (L) 3.5 - 5.0 g/dL   AST 20 15 - 41 U/L   ALT 23 0 - 44 U/L   Alkaline Phosphatase 74 38 - 126 U/L   Total Bilirubin 0.6 0.3 - 1.2 mg/dL   GFR, Estimated >60 >60 mL/min    Comment: (NOTE) Calculated using the CKD-EPI Creatinine Equation (2021)    Anion gap 6 5 - 15    Comment: Performed at [facility], [place] [address] [place],  [address]  CBC with Differential     Status: None   Collection Time: 05/04/21  4:28 PM  Result Value Ref Range   WBC 9.0 4.0 - 10.5 K/uL   RBC 4.73 3.87 - 5.11 MIL/uL   Hemoglobin 13.6 12.0 - 15.0 g/dL   HCT 42.3 36.0 - 46.0 %   MCV 89.4 80.0 - 100.0 fL   MCH 28.8 26.0 - 34.0 pg   MCHC 32.2 30.0 -  36.0 g/dL   RDW 13.4 11.5 - 15.5 %   Platelets 280 150 - 400 K/uL   nRBC 0.0 0.0 - 0.2 %   Neutrophils Relative % 75 %   Neutro Abs 6.6 1.7 - 7.7 K/uL   Lymphocytes Relative 14 %   Lymphs Abs 1.3 0.7 - 4.0 K/uL   Monocytes Relative 10 %   Monocytes Absolute 0.9 0.1 - 1.0 K/uL   Eosinophils Relative 1 %   Eosinophils Absolute 0.1 0.0 - 0.5 K/uL   Basophils Relative 0 %   Basophils Absolute 0.0 0.0 - 0.1 K/uL   Immature Granulocytes 0 %   Abs Immature Granulocytes 0.03 0.00 - 0.07 K/uL    Comment: Performed at [facility], [place] [address] [place], [place] [address]  Magnesium     Status: None   Collection Time: 05/04/21  4:28 PM  Result Value Ref Range   Magnesium 2.2 1.7 - 2.4 mg/dL    Comment: Performed at [facility], [place] [address] [place], [place] [address]   Hemoglobin A1c     Status: Abnormal   Collection Time: 05/04/21  4:28 PM  Result Value Ref Range   Hgb A1c MFr Bld 6.0 (H) 4.8 - 5.6 %    Comment: (NOTE)         Prediabetes: 5.7 - 6.4         Diabetes: >6.4         Glycemic control for adults with diabetes: <7.0    Mean Plasma Glucose 126 mg/dL    Comment: (NOTE) Performed At: [facility] [place], [place] [id number] Rush Farmer MD QZ:[id number]   Lipid panel     Status: Abnormal   Collection Time: 05/04/21  4:28 PM  Result Value Ref Range   Cholesterol 118 0 - 200 mg/dL   Triglycerides 109 <150 mg/dL   HDL 39 (L) >40 mg/dL   Total CHOL/HDL Ratio 3.0 RATIO   VLDL 22 0 - 40 mg/dL   LDL Cholesterol 57 0 - 99 mg/dL    Comment:        Total Cholesterol/HDL:CHD Risk Coronary Heart Disease Risk Table                     Men   Women  1/2 Average Risk   3.4   3.3  Average Risk       5.0   4.4  2 X Average Risk   9.6   7.1  3 X Average Risk  23.4   11.0        Use the calculated Patient Ratio above and the CHD Risk Table to determine the patient's CHD Risk.        ATP III CLASSIFICATION (LDL):  <100     mg/dL   Optimal  100-129  mg/dL   Near or Above                    Optimal  130-159  mg/dL   Borderline  160-189  mg/dL   High  >190     mg/dL   Very High Performed at [place] [address] [place], [place] [address]   TSH     Status: None   Collection Time: 05/04/21  4:28 PM  Result Value Ref Range   TSH 2.115 0.350 - 4.500 uIU/mL    Comment: Performed by a 3rd Generation assay with a functional sensitivity of <=0.01 uIU/mL.  Performed at [facility], [place] [address] [place], [place] [address]   Resp Panel by RT-PCR (Flu A&B, Covid) Nasopharyngeal Swab     Status: None   Collection Time: 05/04/21  4:37 PM   Specimen: Nasopharyngeal Swab; Nasopharyngeal(NP) swabs in vial transport medium  Result Value Ref Range   SARS Coronavirus 2 by RT PCR  NEGATIVE NEGATIVE    Comment: (NOTE) SARS-CoV-2 target nucleic acids are NOT DETECTED.  The SARS-CoV-2 RNA is generally detectable in upper respiratory specimens during the acute phase of infection. The lowest concentration of SARS-CoV-2 viral copies this assay can detect is 138 copies/mL. A negative result does not preclude SARS-Cov-2 infection and should not be used as the sole basis for treatment or other patient management decisions. A negative result may occur with  improper specimen collection/handling, submission of specimen other than nasopharyngeal swab, presence of viral mutation(s) within the areas targeted by this assay, and inadequate number of viral copies(<138 copies/mL). A negative result must be combined with clinical observations, patient history, and epidemiological information. The expected result is Negative.  Fact Sheet for Patients:  EntrepreneurPulse.com.au  Fact Sheet for Healthcare Providers:  IncredibleEmployment.be  This test is no t yet approved or cleared by the [place] FDA and  has been authorized for detection and/or diagnosis of SARS-CoV-2 by FDA under an Emergency Use Authorization (EUA). This EUA will remain  in effect (meaning this test can be used) for the duration of the COVID-19 declaration under Section 564(b)(1) of the Act, 21 U.S.C.section 360bbb-3(b)(1), unless the authorization is terminated  or revoked sooner.       Influenza A by PCR NEGATIVE NEGATIVE   Influenza B by PCR NEGATIVE NEGATIVE    Comment: (NOTE) The Xpert Xpress SARS-CoV-2/FLU/RSV plus assay is intended as an aid in the diagnosis of influenza from Nasopharyngeal swab specimens and should not be used as a sole basis for treatment. Nasal washings and aspirates are unacceptable for Xpert Xpress SARS-CoV-2/FLU/RSV testing.  Fact Sheet for Patients: EntrepreneurPulse.com.au  Fact Sheet for Healthcare  Providers: IncredibleEmployment.be  This test is not yet approved or cleared by the [place] FDA and has been authorized for detection and/or diagnosis of SARS-CoV-2 by FDA under an Emergency Use Authorization (EUA). This EUA will remain in effect (meaning this test can be used) for the duration of the COVID-19 declaration under Section 564(b)(1) of the Act, 21 U.S.C. section 360bbb-3(b)(1), unless the authorization is terminated or revoked.  Performed at [facility], [place] [address] [place], [place] [address]   Urine rapid drug screen (hosp performed)     Status: Abnormal   Collection Time: 05/04/21  5:55 PM  Result Value Ref Range   Opiates NONE DETECTED NONE DETECTED   Cocaine NONE DETECTED NONE DETECTED   Benzodiazepines POSITIVE (A) NONE DETECTED   Amphetamines NONE DETECTED NONE DETECTED   Tetrahydrocannabinol NONE DETECTED NONE DETECTED   Barbiturates NONE DETECTED NONE DETECTED    Comment: (NOTE) DRUG SCREEN FOR MEDICAL PURPOSES ONLY.  IF CONFIRMATION IS NEEDED FOR ANY PURPOSE, NOTIFY LAB WITHIN 5 DAYS.  LOWEST DETECTABLE LIMITS FOR URINE DRUG SCREEN Drug Class                     Cutoff (ng/mL) Amphetamine and metabolites    1000 Barbiturate and metabolites    200 Benzodiazepine                 443 Tricyclics and metabolites  300 Opiates and metabolites        300 Cocaine and metabolites        300 THC                            50 Performed at [facility] [address], [place], [place] [address]   Acetaminophen level     Status: Abnormal   Collection Time: 05/04/21  8:15 PM  Result Value Ref Range   Acetaminophen (Tylenol), Serum <10 (L) 10 - 30 ug/mL    Comment: (NOTE) Therapeutic concentrations vary significantly. A range of 10-30 ug/mL  may be an effective concentration for many patients. However, some  are best treated at concentrations outside of this range. Acetaminophen  concentrations >150 ug/mL at 4 hours after ingestion  and >50 ug/mL at 12 hours after ingestion are often associated with  toxic reactions.  Performed at [facility], [place] [address], [place], [place] [address]     Blood Alcohol level:  No results found for: [facility]  Metabolic Disorder Labs: Lab Results  Component Value Date   HGBA1C 6.0 (H) 05/04/2021   MPG 126 05/04/2021   MPG 131 08/25/2018   No results found for: PROLACTIN Lab Results  Component Value Date   CHOL 118 05/04/2021   TRIG 109 05/04/2021   HDL 39 (L) 05/04/2021   CHOLHDL 3.0 05/04/2021   VLDL 22 05/04/2021   LDLCALC 57 05/04/2021    Physical Findings: AIMS:  , ,  ,  ,    CIWA:  CIWA-Ar Total: 3 COWS:     Musculoskeletal: Strength & Muscle Tone: within normal limits and atrophy Gait & Station: normal Patient leans: N/A  Psychiatric Specialty Exam:  Presentation  General Appearance: Appropriate for Environment; Other (comment); Fairly Groomed (unkempt)  Eye Contact:Fair  Speech:Clear and Coherent; Normal Rate  Speech Volume:Normal  Handedness: Right  Mood and Affect  Mood:Anxious; Depressed; Hopeless  Affect:Congruent; Depressed; Flat   Thought Process  Thought Processes:Coherent; Linear  Descriptions of Associations:Circumstantial  Orientation:Full (Time, Place and Person)  Thought Content:Rumination  History of Schizophrenia/Schizoaffective disorder:No  Duration of Psychotic Symptoms:No data recorded Hallucinations:Hallucinations: None  Ideas of Reference:None  Suicidal Thoughts:Suicidal Thoughts: No  Homicidal Thoughts:Homicidal Thoughts: No   Sensorium  Memory:Immediate Fair; Recent Fair; Remote Fair  Judgment:Poor  Insight:Shallow   Executive Functions  Concentration:Fair  Attention Span:Fair  [place] of Knowledge:Good  Language:Good   Psychomotor Activity  Psychomotor Activity:Psychomotor Activity: Normal; Other (comment)  (no tremor)   Assets  Assets:Communication Skills; Desire for Improvement; Financial Resources/Insurance; Housing; Resilience; Social Support; Transportation; Vocational/Educational   Sleep  Sleep:Sleep: Poor Number of Hours of Sleep: 6.75  Physical Exam: Physical Exam Vitals and nursing note reviewed.  Constitutional:      Appearance: Normal appearance.  HENT:     Head: Normocephalic.  Pulmonary:     Effort: Pulmonary effort is normal.  Musculoskeletal:        General: Normal range of motion.     Cervical back: Normal range of motion.  Neurological:     General: No focal deficit present.     Mental Status: She is alert and oriented to person, place, and time.  Psychiatric:        Attention and Perception: Attention normal. She does not perceive auditory or visual hallucinations.        Mood and Affect: Mood is anxious and depressed.  Speech: Speech normal.        Behavior: Behavior normal. Behavior is cooperative.        Thought Content: Thought content is not paranoid or delusional. Thought content does not include homicidal or suicidal ideation. Thought content does not include homicidal or suicidal plan.        Cognition and Memory: Cognition normal.   Review of Systems  Constitutional:  Negative for fever.  HENT:  Negative for congestion, sinus pain and sore throat.   Respiratory:  Negative for cough and shortness of breath.   Cardiovascular: Negative.   Gastrointestinal: Negative.   Genitourinary: Negative.   Musculoskeletal: Negative.   Neurological: Negative.   Blood pressure 133/81, pulse 67, temperature 98 F (36.7 C), temperature source Oral, resp. rate 18, height 5\' 4"  (1.626 m), weight 101.6 kg, SpO2 98 %. Body mass index is 38.45 kg/m.   Treatment Plan Summary: Daily contact with patient to assess and evaluate symptoms and progress in treatment and Medication management  Depression: - Restart Prozac 60 mg PO daily  Anxiety:  -Restart  Alprazolam 0.5 mg TID PRN -Restart Propranolol 20 mg PO TID -Continue Vistaril 25 mg po TID PRN  Insomnia:  -Initiate Trazodone 50 mg PO at bedtime PRN  CIWA protocol for potential benzodiazepine withdrawal: (PDMP shows patient has 2 recent prescriptions filled on 5/26 & 6/3/for 30 day supply of alprazolam and Klonopin) she will only receive alprazolam while hospitalized  Medical medications: -Continue Verapamil CR 180 mg PO at bedtime for hypertension -Protonix 40 mg PO daily at bedtime for GERD -Lisinopril 10 mg PO daily for hypertension  Encourage participation in the therapeutic milieu Continue 15 minute safety checks Discharge planning in progress    Ethelene Hal, NP 05/06/2021, 11:41 AM

## 2021-05-06 NOTE — [facility] Suicide Risk Assessment (Addendum)
[place] INPATIENT:  Family/Significant Other Suicide Prevention Education  At patient's request, CSW called her husband to talk about the possibility of a transfer to [facility] for ECT.  SPE was also provided, as shown below.  It was explained to husband that we cannot simply discharge the patient in a state of crisis with the plan being to take her to [facility] for ECT.  He has spoken with a doctor there who stated that they would stay in the ED 1-2 days, then would get a bed and be able to get the ECT "faster from the inpatient unit" at that point.  CSW explained that there are two options:  (1) patient is sufficiently stabilized for discharge and is sent home, where he can then take her for ECT admission, or (2) we make arrangements to do a hospital-to-hospital transfer.  He gave his email address ([email or web address]) and asked for an email to be sent to him so that he can forward it to the [facility] staff to enable [place] to start working on this issue.  This was done, with a copy to lead Education officer, museum and weekday Education officer, museum.  Suicide Prevention Education:  Education Completed; Alexis Wall (husband) [phone],  (name of family member/significant other) has been identified by the patient as the family member/significant other with whom the patient will be residing, and identified as the person(s) who will aid the patient in the event of a mental health crisis (suicidal ideations/suicide attempt).  With written consent from the patient, the family member/significant other has been provided the following suicide prevention education, prior to the and/or following the discharge of the patient.  The suicide prevention education provided includes the following: Suicide risk factors Suicide prevention and interventions National Suicide Hotline telephone number [facility] assessment telephone number [facility] Emergency Assistance [place] and/or Residential Mobile Crisis Unit  telephone number  Request made of family/significant other to: Remove weapons (e.g., guns, rifles, knives), all items previously/currently identified as safety concern.   Remove drugs/medications (over-the-counter, prescriptions, illicit drugs), all items previously/currently identified as a safety concern.  The family member/significant other verbalizes understanding of the suicide prevention education information provided.  The family member/significant other agrees to remove the items of safety concern listed above.  There are currently guns in the home in a safe, but husband states he will be taking them out of the house completely before patient comes home.  He also stated he is going to get a medication box and the patient will no longer have access to her medicines.  Husband stated that this event of patient trying to take her own life has "shaken me to my core."  It has greatly disturbed their 20yo daughter who found the suicide note on the door, and he has already arranged for their daughter to see a psychiatrist this week.  Husband stated that in addition to patient's father committing suicide, which is a risk factor for patient's suicidality, they are also coming up on the 2nd anniversary of that death and she has been exceedingly sad about that.  They have just sold father's house and are in the process of clearing it out, which contributes even more to the feelings of loss.  Finally, their youngest child is 36yo and is leaving soon for college.  The patient has always "lived through our kids" so this is a very difficult transition. Alexis Wall 05/06/2021, 4:01 PM

## 2021-05-06 NOTE — Progress Notes (Signed)
[place] NOVEL CORONAVIRUS (COVID-19) DAILY CHECK-OFF SYMPTOMS - answer yes or no to each - every day NO YES  Have you had a fever in the past 24 hours?  . Fever (Temp > 37.80C / 100F) X   Have you had any of these symptoms in the past 24 hours? . New Cough .  Sore Throat  .  Shortness of Breath .  Difficulty Breathing .  Unexplained Body Aches   X   Have you had any one of these symptoms in the past 24 hours not related to allergies?   . Runny Nose .  Nasal Congestion .  Sneezing   X   If you have had runny nose, nasal congestion, sneezing in the past 24 hours, has it worsened?  X   EXPOSURES - check yes or no X   Have you traveled outside the state in the past 14 days?  X   Have you been in contact with someone with a confirmed diagnosis of COVID-19 or PUI in the past 14 days without wearing appropriate PPE?  X   Have you been living in the same home as a person with confirmed diagnosis of COVID-19 or a PUI (household contact)?    X   Have you been diagnosed with COVID-19?    X              What to do next: Answered NO to all: Answered YES to anything:   Proceed with unit schedule Follow the [facility] Inpatient Flowsheet.   

## 2021-05-06 NOTE — [facility] Group Notes (Signed)
LCSW Group Therapy Note  05/06/2021   10:00-11:00am   Type of Therapy and Topic:  Group Therapy: Anger Cues and Responses  Participation Level:  Active   Description of Group:   In this group, patients learned how to recognize the physical, cognitive, emotional, and behavioral responses they have to anger-provoking situations.  They identified a recent time they became angry and how they reacted.  They analyzed how their reaction was possibly beneficial and how it was possibly unhelpful.  The group discussed a variety of healthier coping skills that could help with such a situation in the future.  Focus was placed on how helpful it is to recognize the underlying emotions to our anger, because working on those can lead to a more permanent solution as well as our ability to focus on the important rather than the urgent.  Therapeutic Goals: Patients will remember their last incident of anger and how they felt emotionally and physically, what their thoughts were at the time, and how they behaved. Patients will identify how their behavior at that time worked for them, as well as how it worked against them. Patients will explore possible new behaviors to use in future anger situations. Patients will learn that anger itself is normal and cannot be eliminated, and that healthier reactions can assist with resolving conflict rather than worsening situations.  Summary of Patient Progress:  The patient shared that her most recent time of anger was prior to admission and said she gets angry at her husband and children for being happy, because she is so desperately sad and depressed herself.Marland Kitchen Ultimately she overdosed in an attempt to die, wrote notes prior to the overdose and actually did intend to die in her overdose.  She stated that she has been experiencing this particular episode of depression since March, and previously received ECT at [facility], wants to go back there for ECT again.  Therapeutic Modalities:    Cognitive Behavioral Therapy  Alexis Wall

## 2021-05-06 NOTE — Progress Notes (Addendum)
   05/06/21 1100  Psych Admission Type (Psych Patients Only)  Admission Status Involuntary  Psychosocial Assessment  Patient Complaints Depression  Eye Contact Fair  Facial Expression Anxious;Sad;Sullen  Affect Anxious;Preoccupied  Speech Logical/coherent;Soft  Interaction Assertive  Motor Activity Slow;Unsteady  Appearance/Hygiene In scrubs  Behavior Characteristics Cooperative  Mood Depressed;Anxious  Thought Process  Coherency WDL  Content Blaming self  Delusions None reported or observed  Perception WDL  Hallucination None reported or observed  Judgment Poor  Confusion None  Danger to Self  Current suicidal ideation? Denies  Self-Injurious Behavior No self-injurious ideation or behavior indicators observed or expressed   Agreement Not to Harm Self Yes  Description of Agreement Verbal contract  Danger to Others  Danger to Others None reported or observed   D. Pt presents with a depressed affect/ mood- calm, cooperative behavior- observed in the milieu interacting appropriately with peers, and attending groups. Per pt's self inventory, pt rated her depression, hopelessness and anxiety a 9/10/5, respectively. Pt wrote that her goal today was "to stop feeling depressed by getting dressed and try to get out of my room". Pt currently denies SI/HI and  A/VH.  A. Labs and vitals monitored. Pt given and educated on medications. Pt supported emotionally and encouraged to express concerns and ask questions.   R. Pt remains safe with 15 minute checks. Will continue POC.

## 2021-05-07 MED ORDER — PROPRANOLOL HCL 10 MG PO TABS
10.0000 mg | ORAL_TABLET | Freq: Three times a day (TID) | ORAL | Status: DC
  Administered 2021-05-08 (×2): 10 mg via ORAL
  Filled 2021-05-07 (×7): qty 1

## 2021-05-07 NOTE — Progress Notes (Signed)
   05/07/21 1300  Psych Admission Type (Psych Patients Only)  Admission Status Involuntary  Psychosocial Assessment  Patient Complaints Depression  Eye Contact Fair  Facial Expression Anxious  Affect Anxious;Preoccupied  Speech Logical/coherent;Soft  Interaction Assertive  Motor Activity Slow;Unsteady  Appearance/Hygiene In scrubs  Behavior Characteristics Cooperative  Mood Depressed  Thought Process  Coherency WDL  Content Blaming self  Delusions None reported or observed  Perception WDL  Hallucination None reported or observed  Judgment Poor  Confusion None  Danger to Self  Current suicidal ideation? Denies  Self-Injurious Behavior No self-injurious ideation or behavior indicators observed or expressed   Agreement Not to Harm Self Yes  Description of Agreement Verbal contract  Danger to Others  Danger to Others None reported or observed

## 2021-05-07 NOTE — Progress Notes (Signed)
   D:  Pt presents with moderate anxiety and depression.  Pt denies SI/HI and AVH.  Pt verbally contracts for safety.  Pt CPAP was declined for use on last evening.  A:  Labs/Vitals monitored; Medication education provided; Pt encouraged to communicate concerns.  R:  Pt remains safe on unit with q15 minute safety checks.  Will continue POC.

## 2021-05-07 NOTE — Plan of Care (Signed)
  Problem: Education: Goal: Emotional status will improve Outcome: Not Progressing Goal: Mental status will improve Outcome: Not Progressing   Problem: Coping: Goal: Coping ability will improve Outcome: Not Progressing

## 2021-05-07 NOTE — Progress Notes (Signed)
       05/06/21 2130  Psych Admission Type (Psych Patients Only)  Admission Status Involuntary  Psychosocial Assessment  Patient Complaints Anxiety;Depression  Eye Contact Fair  Facial Expression Anxious  Affect Anxious;Preoccupied  Speech Logical/coherent;Soft  Interaction Assertive  Motor Activity Slow;Unsteady  Appearance/Hygiene In scrubs  Behavior Characteristics Cooperative  Mood Depressed;Anxious  Thought Process  Coherency WDL  Content Blaming self  Delusions None reported or observed  Perception WDL  Hallucination None reported or observed  Judgment Poor  Confusion None  Danger to Self  Current suicidal ideation? Denies  Self-Injurious Behavior No self-injurious ideation or behavior indicators observed or expressed   Agreement Not to Harm Self Yes  Description of Agreement Verbal contract  Danger to Others  Danger to Others None reported or observed

## 2021-05-07 NOTE — Progress Notes (Addendum)
[facility] MD Progress Note  05/07/2021 3:48 PM Alexis Wall  MRN:  [id number] Subjective:  "I need my Prozac, I am so depressed. I haven't had my Prozac in 2 days. I am ready to go to [facility] for ECT. My husband has been working with [facility] and here to get me there."   Objective:  Alexis Wall is a 55 year old female with a history of major depressive disorder and anxiety who presented to [facility] ED on 05/04/21 following an overdose on Prozac, Ambien and Xanax in a suicide attempt.  There are conflicting numbers of tablets documented as having been taken in the overdose in the EMR.  The patient reported on assessment in the ED that she planned out the attempt and wrote suicide notes to family members.  She was transferred to [facility] for further treatment of depression.  Evaluation on the unit: Patient was seen and evaluated, chart reviewed and case discussed with the treatment team. Patient stated she is depressed and anxious today but better today. She continues to appear depressed with a flat affect. She stated she believes she could be discharged home and go to her follow up psychiatrist appointment next week, she is unable to say the date of this appointment. She stated she just wants to be home with her family. Reminded her that today is Sunday and social work can not verify appointment dates or times today. She is aware a referral is being made to [facility] ECT to see if a bed is available for ECT for a bed to bed transfer. Patient was also reminded that she will need to be assessed daily to be sure she is stable prior to discharge and that she did take an overdose in a suicide attempt. Today she denies SI/HI/AVH, paranoia and delusions. She is able to contract for safety. She slept 5 hours last night. She reported a fair appetite. She went to group, outside and to the cafeteria for meals. CSW spoke with patient's husband yesterday and he is taking over the patient's medications and locking them up, she will not have access  to them going forward. Will continue to monitor.    Principal Problem: MDD (major depressive disorder), recurrent severe, without psychosis ([place]) Diagnosis: Principal Problem:   MDD (major depressive disorder), recurrent severe, without psychosis ([place]) Active Problems:   Generalized anxiety disorder  Total Time spent with patient:  25 minutes  Past Psychiatric History: See H&P  Past Medical History:  Past Medical History:  Diagnosis Date   Anemia    Arthritis    pains in knee   Bipolar disorder ([place])    per Dr D.Patterson's office note 07/08/2007   Depression    [facility]   Diabetes mellitus    GESTATIONAL-not now   GERD (gastroesophageal reflux disease)    Hypertension    Morbid obesity ([place])    Polyp of colon    Sleep apnea    Wears CPAP    Past Surgical History:  Procedure Laterality Date   ABDOMINAL HYSTERECTOMY  03/21/2010   TAH   BREATH TEK H PYLORI  08/05/2012   Procedure: BREATH TEK H PYLORI;  Surgeon: Pedro Earls, MD;  Location: [facility];  Service: General;  Laterality: N/A;  [place]  01/26/1999, 09/30/2002   X2.Marland Kitchen WITH BTSP   CHOLECYSTECTOMY N/A 08/27/2018   Procedure: LAPAROSCOPIC CHOLECYSTECTOMY WITH INTRAOPERATIVE CHOLANGIOGRAM;  Surgeon: Excell Seltzer, MD;  Location: [facility]S;  Service: General;  Laterality: N/A;   ENDOMETRIAL ABLATION  08/25/2009   HER OPTION    Family History:  Family History  Problem Relation Age of Onset   Hypertension Father    Obesity Father    Cancer Maternal Grandfather        colon   Sleep apnea Mother    Heart disease Paternal Grandfather    Colon cancer Neg Hx    Family Psychiatric  History: See H&P Social History:  Social History   Substance and Sexual Activity  Alcohol Use Yes   Alcohol/week: 1.0 standard drink   Types: 1 Glasses of wine per week   Comment: occasional glass of wine once per week     Social History   Substance and Sexual Activity  Drug Use No    Social History    Socioeconomic History   Marital status: Married    Spouse name: Not on file   Number of children: Not on file   Years of education: Not on file   Highest education level: Not on file  Occupational History   Not on file  Tobacco Use   Smoking status: Former    Pack years: 0.00    Types: Cigarettes    Quit date: 05/03/1979    Years since quitting: 42.0   Smokeless tobacco: Never  Vaping Use   Vaping Use: Never used  Substance and Sexual Activity   Alcohol use: Yes    Alcohol/week: 1.0 standard drink    Types: 1 Glasses of wine per week    Comment: occasional glass of wine once per week   Drug use: No   Sexual activity: Yes    Comment: TAH  Other Topics Concern   Not on file  Social History Narrative   Not on file   Social Determinants of Health   Financial Resource Strain: Not on file  Food Insecurity: Not on file  Transportation Needs: Not on file  Physical Activity: Not on file  Stress: Not on file  Social Connections: Not on file   Additional Social History:     Sleep: Fair  Appetite:  Fair  Current Medications: Current Facility-Administered Medications  Medication Dose Route Frequency Provider Last Rate Last Admin   acetaminophen (TYLENOL) tablet 650 mg  650 mg Oral Q6H PRN Prescilla Sours, PA-C   650 mg at 05/05/21 1420   ALPRAZolam (XANAX) tablet 0.5 mg  0.5 mg Oral TID PRN Ethelene Hal, NP       alum & mag hydroxide-simeth (MAALOX/MYLANTA) 200-200-20 MG/5ML suspension 30 mL  30 mL Oral Q4H PRN [name] W, PA-C       feeding supplement (ENSURE ENLIVE / ENSURE PLUS) liquid 237 mL  237 mL Oral BID BM [name] W, PA-C   237 mL at 05/05/21 1413   FLUoxetine (PROZAC) capsule 60 mg  60 mg Oral Daily Ethelene Hal, NP   60 mg at 05/07/21 1245   hydrOXYzine (ATARAX/VISTARIL) tablet 25 mg  25 mg Oral Q6H PRN Arthor Captain, MD   25 mg at 05/07/21 1351   lisinopril (ZESTRIL) tablet 10 mg  10 mg Oral Daily [name] W, PA-C   10 mg at  05/07/21 8099   loperamide (IMODIUM) capsule 2-4 mg  2-4 mg Oral PRN Arthor Captain, MD       magnesium hydroxide (MILK OF MAGNESIA) suspension 30 mL  30 mL Oral Daily PRN [name] W, PA-C       melatonin tablet 10 mg  10 mg Oral QHS Jinny Blossom  Toribio Harbour, NP   10 mg at 05/06/21 2129   multivitamin with minerals tablet 1 tablet  1 tablet Oral Daily Arthor Captain, MD   1 tablet at 05/07/21 0821   ondansetron (ZOFRAN-ODT) disintegrating tablet 4 mg  4 mg Oral Q6H PRN Arthor Captain, MD   4 mg at 05/05/21 2116   pantoprazole (PROTONIX) EC tablet 40 mg  40 mg Oral QHS [name] W, PA-C   40 mg at 05/06/21 2130   propranolol (INDERAL) tablet 20 mg  20 mg Oral TID Ethelene Hal, NP   20 mg at 05/07/21 4782   thiamine tablet 100 mg  100 mg Oral Daily Arthor Captain, MD   100 mg at 05/07/21 9562   traZODone (DESYREL) tablet 50 mg  50 mg Oral QHS,MR X 1 Ethelene Hal, NP   50 mg at 05/07/21 0035   verapamil (CALAN-SR) CR tablet 180 mg  180 mg Oral QHS Prescilla Sours, PA-C   180 mg at 05/06/21 2130    Lab Results:  No results found for this or any previous visit (from the past 59 hour(s)).   Blood Alcohol level:  No results found for: [facility]  Metabolic Disorder Labs: Lab Results  Component Value Date   HGBA1C 6.0 (H) 05/04/2021   MPG 126 05/04/2021   MPG 131 08/25/2018   No results found for: PROLACTIN Lab Results  Component Value Date   CHOL 118 05/04/2021   TRIG 109 05/04/2021   HDL 39 (L) 05/04/2021   CHOLHDL 3.0 05/04/2021   VLDL 22 05/04/2021   LDLCALC 57 05/04/2021    Physical Findings: AIMS:  , ,  ,  ,    CIWA:  CIWA-Ar Total: 1 COWS:     Musculoskeletal: Strength & Muscle Tone: within normal limits and atrophy Gait & Station: normal Patient leans: N/A  Psychiatric Specialty Exam:  Presentation  General Appearance: Appropriate for Environment; Other (comment); Fairly Groomed (unkempt)  Eye Contact:Fair  Speech:Clear and Coherent; Normal  Rate  Speech Volume:Normal  Handedness: Right  Mood and Affect  Mood:Anxious; Depressed; Hopeless  Affect:Congruent; Depressed; Flat  Thought Process  Thought Processes:Coherent; Linear  Descriptions of Associations:Circumstantial  Orientation:Full (Time, Place and Person)  Thought Content:Rumination  History of Schizophrenia/Schizoaffective disorder:No  Duration of Psychotic Symptoms:No data recorded Hallucinations:No data recorded  Ideas of Reference:None  Suicidal Thoughts:No data recorded  Homicidal Thoughts:No data recorded  Sensorium  Memory:Immediate Fair; Recent Fair; Remote Fair  Judgment:Poor  Insight:Shallow  Executive Functions  Concentration:Fair  Attention Span:Fair  [place]  Language:Good  Psychomotor Activity  Psychomotor Activity:No data recorded  Assets  Assets:Communication Skills; Desire for Improvement; Financial Resources/Insurance; Housing; Resilience; Social Support; Transportation; Vocational/Educational  Sleep  Sleep:No data recorded  Physical Exam: Physical Exam Vitals and nursing note reviewed.  Constitutional:      Appearance: Normal appearance.  HENT:     Head: Normocephalic.  Pulmonary:     Effort: Pulmonary effort is normal.  Musculoskeletal:        General: Normal range of motion.     Cervical back: Normal range of motion.  Neurological:     General: No focal deficit present.     Mental Status: She is alert and oriented to person, place, and time.  Psychiatric:        Attention and Perception: Attention normal. She does not perceive auditory or visual hallucinations.        Mood and Affect: Mood is anxious and depressed.  Speech: Speech normal.        Behavior: Behavior normal. Behavior is cooperative.        Thought Content: Thought content is not paranoid or delusional. Thought content does not include homicidal or suicidal ideation. Thought content does not include  homicidal or suicidal plan.        Cognition and Memory: Cognition normal.   Review of Systems  Constitutional:  Negative for fever.  HENT:  Negative for congestion, sinus pain and sore throat.   Respiratory:  Negative for cough and shortness of breath.   Cardiovascular: Negative.   Gastrointestinal: Negative.   Genitourinary: Negative.   Musculoskeletal: Negative.   Neurological: Negative.   Blood pressure 129/74, pulse (!) 55, temperature 98.3 F (36.8 C), temperature source Oral, resp. rate 18, height 5\' 4"  (1.626 m), weight 101.6 kg, SpO2 97 %. Body mass index is 38.45 kg/m.   Treatment Plan Summary: Daily contact with patient to assess and evaluate symptoms and progress in treatment and Medication management  Depression: - Continue Prozac 60 mg PO daily  Anxiety:  -Continue Alprazolam 0.5 mg TID PRN -Continue Propranolol 20 mg PO TID -Continue Vistaril 25 mg po TID PRN  Insomnia:  -Continue Trazodone 50 mg PO at bedtime PRN -Continue Melatonin 10 mg PO at bedtime  CIWA protocol for potential benzodiazepine withdrawal: (PDMP shows patient has 2 recent prescriptions filled on 5/26 & 6/3/for 30 day supply of alprazolam and Klonopin) she will only receive alprazolam while hospitalized  Medical medications: -Verapamil CR 180 mg PO at bedtime for hypertension -Protonix 40 mg PO daily at bedtime for GERD -Lisinopril 10 mg PO daily for hypertension  Encourage participation in the therapeutic milieu Continue 15 minute safety checks Discharge planning in progress    Ethelene Hal, NP 05/07/2021, 3:48 PM

## 2021-05-07 NOTE — BHH Counselor (Signed)
Clinical Social Work Note  CSW called the [facility] transfer center [phone] to discuss a possible transfer to their ECT program.  After giving them the requested information, they said since the patient is medically cleared, we need to talk to [facility] directly.  That number is [phone].  They have sent a message asking that line to call CSW.  Husband was informed.  Selmer Dominion, LCSW 05/07/2021, 1:54 PM

## 2021-05-07 NOTE — BHH Group Notes (Signed)
[place] LCSW Group Therapy Note  05/07/2021    Type of Therapy and Topic:  Group Therapy:  A Hero Worthy of Support  Participation Level:  Active   Description of Group:  Patients in this group were introduced to the concept that additional supports including self-support are an essential part of recovery.  Matching needs with supports to help fulfill those needs was explained.  Establishing boundaries that can gradually be increased or decreased was described, with patients giving their own examples of establishing appropriate boundaries in their lives.  A song entitled "My Own Hero" was played and a group discussion ensued in which patients stated it inspired them to help themselves in order to succeed, because other people cannot achieve their goals such as sobriety or stability for them.  A song was played called "I Am Enough" which led to a discussion about being willing to believe we are worth the effort of being a self-support.   Therapeutic Goals: 1)  demonstrate the importance of being a key part of one's own support system 2)  discuss various available supports 3)  encourage patient to use music as part of their self-support and focus on goals 4)  elicit ideas from patients about supports that need to be added   Summary of Patient Progress:  The patient expressed that the type of support she needed to add at discharge was professional support, saying that her husband and 2 adult children are great supports already.  She contributed a number of helpful observations throughout group.  After group she asked about the process of getting her to [facility] to the ECT treatment, and was clearly disappointed when told the process, even though husband had this explained to him yesterday and had said he would explain it to her.  Therapeutic Modalities:   Motivational Interviewing Activity  Maretta Los

## 2021-05-07 NOTE — BHH Group Notes (Signed)
The focus of this group is to help patients establish daily goals to achieve during treatment and discuss how the patient can incorporate goal setting into their daily lives to aide in recovery.  Pt attended group, and stated that she wants to attend all groups and meals and not stay in her room.

## 2021-05-08 DIAGNOSIS — F332 Major depressive disorder, recurrent severe without psychotic features: Secondary | ICD-10-CM

## 2021-05-08 MED ORDER — PROPRANOLOL HCL 10 MG PO TABS
10.0000 mg | ORAL_TABLET | Freq: Two times a day (BID) | ORAL | Status: DC
  Administered 2021-05-09 (×2): 10 mg via ORAL
  Filled 2021-05-08 (×5): qty 1

## 2021-05-08 MED ORDER — BUPROPION HCL ER (XL) 150 MG PO TB24
150.0000 mg | ORAL_TABLET | Freq: Every day | ORAL | Status: DC
  Administered 2021-05-08 – 2021-05-15 (×8): 150 mg via ORAL
  Filled 2021-05-08 (×10): qty 1

## 2021-05-08 NOTE — Progress Notes (Signed)
Recreation Therapy Notes  Date:  6.13.22 Time: 0930 Location: [address] Dayroom  Group Topic: Stress Management  Goal Area(s) Addresses:  Patient will identify positive stress management techniques. Patient will identify benefits of using stress management post d/c.  Behavioral Response: Engaged  Intervention: Stress Managment  Activity :  Meditation.  LRT played a meditation that focused on setting boundaries for your own self care. Patients were to listen and follow as meditation was read to fully engage in activity.  Education:  Stress Management, Discharge Planning.   Education Outcome: Acknowledges Education  Clinical Observations/Feedback:  Pt attended and participated.  Pt had no questions and expressed no concerns.    Victorino Sparrow, LRT/CTRS         Victorino Sparrow A 05/08/2021 11:23 AM

## 2021-05-08 NOTE — BHH Group Notes (Addendum)
Adult Psychoeducational Group Note  Date:  05/08/2021 Time:  11:49 AM  Group Topic/Focus:  Goals Group:   The focus of this group is to help patients establish daily goals to achieve during treatment and discuss how the patient can incorporate goal setting into their daily lives to aide in recovery.  Participation Level:  Active  Participation Quality:  Appropriate  Affect:  Appropriate  Cognitive:  Appropriate  Insight: Appropriate  Engagement in Group:  Engaged  Modes of Intervention:  Discussion  Additional Comments: Patient attended morning goal and activity group and said that her gaol for today is to find coping skills for depression. She also participated in group activity.   Ercelle Winkles W Deliah Strehlow 2/99/2426, 11:49 AM

## 2021-05-08 NOTE — Plan of Care (Signed)
Nurse discussed anxiety, depression and coping skills with patient.  

## 2021-05-08 NOTE — Progress Notes (Signed)
Patient states that she met with her doctor and had a new medication ordered. In addition, the patient stated that she spent more time in the dayroom today. Her goal for tomorrow is to continue to be more hopeful.

## 2021-05-08 NOTE — BHH Counselor (Signed)
CSW spoke with Dr. Ihor Dow [phone] who states that he spoke with Mr. Kromer and is working with the individual that manages referrals at [facility] ECT to help get Mrs. Bohnenkamp an available bed.  Dr. Ihor Dow states that [facility] often prioritizes individuals who are already admitted to the [facility].  Dr. Ihor Dow states that if a bed does not become available in the next 2 weeks then he is going to recommend to Mrs. Toback that she participate in outpatient ECT or be admitted to the [facility] to help move her along in the process.  Dr. Ihor Dow states that he would like [facility] to continue to call to place the daily referral with [facility] and states that if anything changes he will give the CSW and Mr. Railsback call.

## 2021-05-08 NOTE — BHH Counselor (Signed)
CSW spoke with the husband Mr. Ishikawa who states that his wife's psychiatrist is Kallie Locks at [facility]. Mr. Rieger states that he has spoken with the psychiatrist and is working with him to get an ECT appointment for his wife. Mr. Palen states that he is also speaking with his wife's previous psychiatrist to see if he can also help to get her into the program sooner.  Mr. Greenhouse states that this will be the second round of ECT and his wife was previously seen for ECT in 2008.  Mr. Borjon states that he is open to his wife coming home and then to ECT but would like the doctor to contact him to discuss the process and the options.

## 2021-05-08 NOTE — Progress Notes (Signed)
Cooperative with treatment, pleasant on approach, she remains anxious, but she denies SI/HI & depression. She was complaint with medication on shift.

## 2021-05-08 NOTE — BHH Counselor (Signed)
CSW spoke with the [facility] and was told that there are no upcoming discharges scheduled for their unit at this time and that they are not sure when a bed will be available.  The receptionist stated that there is no waiting list and that the CSW will need to call the program daily to update the referral.  The receptionist also states that once Alexis Wall leaves the hospital she will not be able to call and update this referral herself.  The receptionist states that her Psychiatrist can call and update the referral or any other emergency department or hospital that she is admitted too.  The receptionist asked CSW to fax the demographics and other medical information for the referral.  CSW has sent this information to the [facility] will contact the Program each day to update the referral until the patient is discharged or is transferred to the program.

## 2021-05-08 NOTE — Progress Notes (Signed)
[facility] MD Progress Note  05/08/2021 4:27 PM ELSI STELZER  MRN:  [id number]  Reason for admission:  HALYN FLAUGHER is a 55 year old female with a history of major depressive disorder and anxiety who presented to [facility] ED on 05/04/21 following an overdose on Prozac, Ambien and Xanax in a suicide attempt.  There are conflicting numbers of tablets documented as having been taken in the overdose in the EMR.  The patient reported on assessment in the ED that she planned out the attempt and wrote suicide notes to family members.  She was transferred to [facility] for further treatment of depression.  Objective: Medical record reviewed.  Patient's case discussed in detail with members of the treatment team.  I met with and evaluated the patient on the unit today for follow-up.  Patient states today that she is eager for discharge.  She would like to return home to wait for ECT at [facility] if she cannot be transferred there directly.  She continues to describe her mood as depressed and anxious.  She states she feels uncomfortable being in the hospital.  She denies suicidal ideation.  I discussed with her my concern that she had just met with the [facility] psychiatrist who submitted the consult for ECT just prior to making her suicide attempt at home and that I am worried about her returning home again before there is some improvement in her depressive symptoms.  Team social worker has been in communication with [place] regarding possible transfer to [facility] for inpatient ECT.  Currently a bed is not available.  I explained the situation to the patient and offered to submit a referral for her to receive ECT at [facility].  Patient declines referral to [facility] and states preference to receive ECT at [facility] where she had it in the past and it was successful in 2008.  She denies any medication side effects.  I discussed initiation of a second antidepressant Wellbutrin in addition to Prozac with the goal of improving  her depressive symptoms sooner.  Patient is receptive to adding Wellbutrin to her current medication regimen.  She continues to present as depressed and anxious with congruent affect and is perseverative on discharge.  Hours of sleep were not recorded last night.  Patient continues with low heart rate.  Most recent vitals included BP of 112/73, heart rate of 51, temperature of 98.3 and O2 sat of 97% on room air.  No new labs today.  Principal Problem: MDD (major depressive disorder), recurrent severe, without psychosis ([place]) Diagnosis: Principal Problem:   MDD (major depressive disorder), recurrent severe, without psychosis ([place]) Active Problems:   Generalized anxiety disorder  Total Time spent with patient: 20 minutes  Past Psychiatric History: See admission H&P  Past Medical History:  Past Medical History:  Diagnosis Date   Anemia    Arthritis    pains in knee   Bipolar disorder ([place])    per Dr D.Patterson's office note 07/08/2007   Depression    [facility]   Diabetes mellitus    GESTATIONAL-not now   GERD (gastroesophageal reflux disease)    Hypertension    Morbid obesity ([place])    Polyp of colon    Sleep apnea    Wears CPAP    Past Surgical History:  Procedure Laterality Date   ABDOMINAL HYSTERECTOMY  03/21/2010   TAH   BREATH TEK H PYLORI  08/05/2012   Procedure: BREATH TEK H PYLORI;  Surgeon: Pedro Earls, MD;  Location:  [facility];  Service: General;  Laterality: N/A;  [place]  01/26/1999, 09/30/2002   X2.Marland Kitchen WITH BTSP   CHOLECYSTECTOMY N/A 08/27/2018   Procedure: LAPAROSCOPIC CHOLECYSTECTOMY WITH INTRAOPERATIVE CHOLANGIOGRAM;  Surgeon: Excell Seltzer, MD;  Location: [facility] ORS;  Service: General;  Laterality: N/A;   ENDOMETRIAL ABLATION  08/25/2009   HER OPTION    Family History:  Family History  Problem Relation Age of Onset   Hypertension Father    Obesity Father    Cancer Maternal Grandfather        colon   Sleep apnea Mother    Heart  disease Paternal Grandfather    Colon cancer Neg Hx    Family Psychiatric  History: See admission H&P Social History:  Social History   Substance and Sexual Activity  Alcohol Use Yes   Alcohol/week: 1.0 standard drink   Types: 1 Glasses of wine per week   Comment: occasional glass of wine once per week     Social History   Substance and Sexual Activity  Drug Use No    Social History   Socioeconomic History   Marital status: Married    Spouse name: Not on file   Number of children: Not on file   Years of education: Not on file   Highest education level: Not on file  Occupational History   Not on file  Tobacco Use   Smoking status: Former    Pack years: 0.00    Types: Cigarettes    Quit date: 05/03/1979    Years since quitting: 42.0   Smokeless tobacco: Never  Vaping Use   Vaping Use: Never used  Substance and Sexual Activity   Alcohol use: Yes    Alcohol/week: 1.0 standard drink    Types: 1 Glasses of wine per week    Comment: occasional glass of wine once per week   Drug use: No   Sexual activity: Yes    Comment: TAH  Other Topics Concern   Not on file  Social History Narrative   Not on file   Social Determinants of Health   Financial Resource Strain: Not on file  Food Insecurity: Not on file  Transportation Needs: Not on file  Physical Activity: Not on file  Stress: Not on file  Social Connections: Not on file   Additional Social History:                         Sleep: Fair  Appetite:  Fair  Current Medications: Current Facility-Administered Medications  Medication Dose Route Frequency Provider Last Rate Last Admin   acetaminophen (TYLENOL) tablet 650 mg  650 mg Oral Q6H PRN Prescilla Sours, PA-C   650 mg at 05/05/21 1420   ALPRAZolam (XANAX) tablet 0.5 mg  0.5 mg Oral TID PRN Ethelene Hal, NP   0.5 mg at 05/08/21 1359   alum & mag hydroxide-simeth (MAALOX/MYLANTA) 200-200-20 MG/5ML suspension 30 mL  30 mL Oral Q4H PRN [name] W, PA-C       buPROPion (WELLBUTRIN XL) 24 hr tablet 150 mg  150 mg Oral Daily Arthor Captain, MD       feeding supplement (ENSURE ENLIVE / ENSURE PLUS) liquid 237 mL  237 mL Oral BID BM [name] W, PA-C   237 mL at 05/08/21 1008   FLUoxetine (PROZAC) capsule 60 mg  60 mg Oral Daily Ethelene Hal, NP   60 mg at 05/08/21 [phone]  lisinopril (ZESTRIL) tablet 10 mg  10 mg Oral Daily [name] W, PA-C   10 mg at 05/08/21 1007   magnesium hydroxide (MILK OF MAGNESIA) suspension 30 mL  30 mL Oral Daily PRN [name] W, PA-C       melatonin tablet 10 mg  10 mg Oral QHS Ethelene Hal, NP   10 mg at 05/07/21 2115   multivitamin with minerals tablet 1 tablet  1 tablet Oral Daily Arthor Captain, MD   1 tablet at 05/08/21 0753   pantoprazole (PROTONIX) EC tablet 40 mg  40 mg Oral QHS [name] W, PA-C   40 mg at 05/07/21 2115   propranolol (INDERAL) tablet 10 mg  10 mg Oral TID Ethelene Hal, NP   10 mg at 05/08/21 1159   thiamine tablet 100 mg  100 mg Oral Daily Arthor Captain, MD   100 mg at 05/08/21 0753   traZODone (DESYREL) tablet 50 mg  50 mg Oral QHS,MR X 1 Ethelene Hal, NP   50 mg at 05/07/21 2242   verapamil (CALAN-SR) CR tablet 180 mg  180 mg Oral QHS Prescilla Sours, PA-C   180 mg at 05/07/21 2116    Lab Results: No results found for this or any previous visit (from the past 22 hour(s)).  Blood Alcohol level:  No results found for: [facility]  Metabolic Disorder Labs: Lab Results  Component Value Date   HGBA1C 6.0 (H) 05/04/2021   MPG 126 05/04/2021   MPG 131 08/25/2018   No results found for: PROLACTIN Lab Results  Component Value Date   CHOL 118 05/04/2021   TRIG 109 05/04/2021   HDL 39 (L) 05/04/2021   CHOLHDL 3.0 05/04/2021   VLDL 22 05/04/2021   LDLCALC 57 05/04/2021    Physical Findings: AIMS:  , ,  ,  ,    CIWA:  CIWA-Ar Total: 1 COWS:     Musculoskeletal: Strength & Muscle Tone: within normal limits Gait & Station:  normal Patient leans: N/A  Psychiatric Specialty Exam:  Presentation  General Appearance: Appropriate for Environment; Fairly Groomed  Eye Contact:Good  Speech:Clear and Coherent; Normal Rate  Speech Volume:Normal  Handedness:Left   Mood and Affect  Mood:Anxious; Depressed  Affect:Congruent; Depressed   Thought Process  Thought Processes:Coherent; Goal Directed  Descriptions of Associations:Intact  Orientation:Full (Time, Place and Person)  Thought Content:Rumination  History of Schizophrenia/Schizoaffective disorder:No  Duration of Psychotic Symptoms:No data recorded Hallucinations:Hallucinations: None  Ideas of Reference:None  Suicidal Thoughts:Suicidal Thoughts: No  Homicidal Thoughts:Homicidal Thoughts: No   Sensorium  Memory:Immediate Fair; Recent Fair; Remote Fair  Judgment:Fair  Insight:Shallow   Executive Functions  Concentration:Fair  Attention Span:Fair  [place]  Language:Good   Psychomotor Activity  Psychomotor Activity:Psychomotor Activity: Normal   Assets  Assets:Communication Skills; Desire for Improvement; Financial Resources/Insurance; Housing; Social Support; Transportation; Vocational/Educational   Sleep  Sleep:Sleep: Fair    Physical Exam: Physical Exam Vitals and nursing note reviewed.  Constitutional:      General: She is not in acute distress.    Appearance: Normal appearance. She is not diaphoretic.  HENT:     Head: Normocephalic and atraumatic.  Pulmonary:     Effort: Pulmonary effort is normal.  Neurological:     General: No focal deficit present.     Mental Status: She is alert and oriented to person, place, and time.   Review of Systems  Constitutional:  Negative for chills, diaphoresis and fever.  Respiratory:  Negative for cough and shortness of breath.   Cardiovascular:  Negative for chest pain and palpitations.  Gastrointestinal:  Negative for constipation,  diarrhea, nausea and vomiting.  Musculoskeletal: Negative.   Skin: Negative.   Neurological: Negative.   Psychiatric/Behavioral:  Positive for depression. Negative for hallucinations and suicidal ideas. The patient is nervous/anxious.    Blood pressure 112/73, pulse (!) 51, temperature 98.3 F (36.8 C), temperature source Oral, resp. rate 18, height '5\' 4"'  (1.626 m), weight 101.6 kg, SpO2 97 %. Body mass index is 38.45 kg/m.   Treatment Plan Summary: Daily contact with patient to assess and evaluate symptoms and progress in treatment and Medication management  Continue every 15-minute observation status  Encourage participation in therapeutic milieu  Depression -Continue Prozac 60 mg daily -Wellbutrin XL 150 mg daily  Anxiety -Continue alprazolam 0.5 mg 3 times daily PRN -Decrease propranolol to 10 mg every 12 hours.  Consider discontinuation given low heart rate and lack of efficacy. -Continue Vistaril 25 mg 3 times daily as needed  Insomnia  -Continue melatonin 10 mg at bedtime -continue trazodone 50 mg at bedtime PRN  Hypertension -Continue verapamil CR 180 mg nightly -Continue lisinopril 10 mg daily  GERD -Continue Protonix 40 mg daily  Social work is in daily contact with [place] psychiatry about possible direct transfer to [facility] inpatient for inpatient ECT.  Discharge planning in progress   Arthor Captain, MD 05/08/2021, 4:27 PM

## 2021-05-08 NOTE — BHH Group Notes (Signed)
Occupational Therapy Group Note Date: 05/08/2021 Group Topic/Focus: Strengths Exploration  Group Description: Group encouraged increased participation and engagement through discussion focused on STRENGTHS. Patients were encouraged to fill out a worksheet to structure discussion, that included identifying strengths as it relates to one's relationships, profession, and personal fulfillment. Discussion followed with patients sharing their responses and highlighting their own personal strengths.  Therapeutic Goals: Identify strengths vs weaknesses Discuss and identify ways we can highlight our strengths  Participation Level: Active   Participation Quality: Independent   Behavior: Cooperative   Speech/Thought Process: Focused   Affect/Mood: Constricted   Insight: Fair   Judgement: Fair   Individualization: Alexis Wall was actively engaged in their participation of group discussion/activity. Pt identified "love, kindness, and honesty" as her strengths and shared that she finds strength "within my family and my relationships". Appeared open and receptive to ongoing group discussion, however left early d/t meeting with MD.   Modes of Intervention: Activity, Discussion, Education, and Support  Patient Response to Interventions:  Attentive, Engaged, Receptive, and Interested   Plan: Continue to engage patient in OT groups 2 - 3x/week.  05/08/2021  Ponciano Ort, MOT, OTR/L

## 2021-05-08 NOTE — Progress Notes (Signed)
D:  Patient's self inventory sheet, patient has fair sleep, sleep medication helpful.  Fair appetite, low energy level, good concentration.  Rated depression 6, hopeless 4, anxiety 4.  Denied withdrawals.  Checked nausea.  Denied SI.  Denied physical problems.  Denied physical pain.  Goal is attend groups and meals.  Discuss next steps for discharge or transfer to [facility].  Push myself and talk to MD/SW.  Does have discharge plans. A:  Medications administered per MD orders.  Emotional support and encouragement given patient.   R:  Denied SI and HI, contracts for safety.  Denied A/V hallucinations.  Safety maintained with 15 minute checks.

## 2021-05-09 DIAGNOSIS — F332 Major depressive disorder, recurrent severe without psychotic features: Secondary | ICD-10-CM | POA: Diagnosis not present

## 2021-05-09 MED ORDER — FLUOXETINE HCL 20 MG PO CAPS
40.0000 mg | ORAL_CAPSULE | Freq: Every day | ORAL | Status: DC
  Administered 2021-05-10 – 2021-05-15 (×6): 40 mg via ORAL
  Filled 2021-05-09 (×8): qty 2

## 2021-05-09 NOTE — Progress Notes (Signed)
Adult Psychoeducational Group Note  Date:  05/09/2021 Time:  2:24 PM  Group Topic/Focus:  Goals Group:   The focus of this group is to help patients establish daily goals to achieve during treatment and discuss how the patient can incorporate goal setting into their daily lives to aide in recovery.  Participation Level:  Active  Participation Quality:  Appropriate and Attentive  Affect:  Appropriate  Cognitive:  Alert and Appropriate  Insight: Good  Engagement in Group:  Engaged  Modes of Intervention:  Discussion  Additional Comments:  Pt was very excited for morning goals group. Pt had a goal of continuing to use her journal for her positive thoughts and values. Pt says she feels much better today compared to day one.  Alexis Wall 05/09/2021, 2:24 PM

## 2021-05-09 NOTE — Progress Notes (Signed)
   05/09/21 0036  Psych Admission Type (Psych Patients Only)  Admission Status Involuntary  Psychosocial Assessment  Patient Complaints Insomnia  Eye Contact Fair  Facial Expression Anxious  Affect Anxious;Preoccupied  Speech Logical/coherent;Soft  Interaction Assertive  Motor Activity Slow;Unsteady  Appearance/Hygiene In scrubs  Behavior Characteristics Cooperative;Calm  Mood Depressed  Thought Process  Coherency WDL  Content Blaming self  Delusions None reported or observed  Perception WDL  Hallucination None reported or observed  Judgment Poor  Confusion None  Danger to Self  Current suicidal ideation? Denies  Self-Injurious Behavior No self-injurious ideation or behavior indicators observed or expressed   Agreement Not to Harm Self Yes  Description of Agreement Verbal contract  Danger to Others  Danger to Others None reported or observed

## 2021-05-09 NOTE — Progress Notes (Signed)
Adult Psychoeducational Group Note  Date:  05/09/2021 Time:  4:45 PM  Group Topic/Focus:  Healthy Communication:   The focus of this group is to discuss communication, barriers to communication, as well as healthy ways to communicate with others.  Participation Level:  Active  Participation Quality:  Appropriate and Attentive  Affect:  Appropriate  Cognitive:  Alert and Appropriate  Insight: Appropriate and Good  Engagement in Group:  Engaged  Modes of Intervention:  Discussion  Additional Comments:  Pt attended and participated in the second group of the day on healthy communication.   Alexis Wall 05/09/2021, 4:45 PM

## 2021-05-09 NOTE — Progress Notes (Signed)
Recreation Therapy Notes  Animal-Assisted Activity (AAA) Program Checklist/Progress Notes Patient Eligibility Criteria Checklist & Daily Group note for Rec Tx Intervention  Date: 6.14.22 Time: 58 Location: [address]   AAA/T Program Assumption of Risk Form signed by Teacher, music or Parent Legal Guardian YES  Patient is free of allergies or severe asthma YES   Patient reports no fear of animals YES   Patient reports no history of cruelty to animals YES  Patient understands his/her participation is voluntary YES  Patient washes hands before animal contact YES  Patient washes hands after animal contact YES   Behavioral Response: Engaged  Education: Contractor, Appropriate Animal Interaction   Education Outcome: Acknowledges understanding/In group clarification offered/Needs additional education.   Clinical Observations/Feedback: Pt attended and participated in activity.      Victorino Sparrow, LRT/CTRS        Victorino Sparrow A 05/09/2021 3:26 PM

## 2021-05-09 NOTE — Progress Notes (Signed)
[facility] MD Progress Note  05/09/2021 5:40 PM Alexis Wall  MRN:  [id number]  Reason for admission:  Alexis Wall is a 55 year old female with a history of major depressive disorder and anxiety who presented to [facility] ED on 05/04/21 following an overdose on Prozac, Ambien and Xanax in a suicide attempt.  There are conflicting numbers of tablets documented as having been taken in the overdose in the EMR.  The patient reported on assessment in the ED that she planned out the attempt and wrote suicide notes to family members.  She was transferred to [facility] for further treatment of depression.  Objective: Medical record reviewed.  Patient's case discussed in detail with members of the treatment team.  I met with and evaluated the patient on the unit today for follow-up.  Patient appears brighter today and less depressed.  She reports feeling better, "there is a lift.  I am thinking more positively."  Patient states she has been trying to stay involved with groups and interact socially with peers.  She reports that her mood is better and rates her depression a 3 out of 10 in intensity with 10 being the worst.  She states she has been eating and sleeping adequately.  She reports interest in getting involved in volunteer work and social interactions with friends outside the hospital after eventual discharge.  She remains interested in ECT only at [facility].  I again discussed with her that we could refer her for possible ECT at [facility].  Patient declines referral to [facility] and wants to pursue ECT treatment at [facility].  She denies medication side effects and states she feels like she may have more energy since starting Wellbutrin yesterday.  We discussed reducing her Prozac dose to 40 mg daily since Wellbutrin may slow the metabolism of Prozac.  Patient is receptive to this change.  I also discussed with her the decrease in propranolol with the plan to discontinue it since she has not felt it to  be very effective for her anxiety.  She is also in agreement with this plan.  She denies any physical problems.  The patient slept 5 hours last night.  She has been attending groups and has been present in the milieu and interacting appropriately with peers.  No new labs today.  She continues with bradycardia but vital signs are otherwise stable.  Principal Problem: MDD (major depressive disorder), recurrent severe, without psychosis ([place]) Diagnosis: Principal Problem:   MDD (major depressive disorder), recurrent severe, without psychosis ([place]) Active Problems:   Generalized anxiety disorder  Total Time spent with patient: 25 minutes  Past Psychiatric History: See admission H&P  Past Medical History:  Past Medical History:  Diagnosis Date   Anemia    Arthritis    pains in knee   Bipolar disorder ([place])    per Dr D.Patterson's office note 07/08/2007   Depression    [facility]   Diabetes mellitus    GESTATIONAL-not now   GERD (gastroesophageal reflux disease)    Hypertension    Morbid obesity ([place])    Polyp of colon    Sleep apnea    Wears CPAP    Past Surgical History:  Procedure Laterality Date   ABDOMINAL HYSTERECTOMY  03/21/2010   TAH   BREATH TEK H PYLORI  08/05/2012   Procedure: BREATH TEK H PYLORI;  Surgeon: Pedro Earls, MD;  Location: [facility];  Service: General;  Laterality: N/A;  [place]  01/26/1999, 09/30/2002   X2.Marland Kitchen WITH BTSP   CHOLECYSTECTOMY N/A 08/27/2018   Procedure: LAPAROSCOPIC CHOLECYSTECTOMY WITH INTRAOPERATIVE CHOLANGIOGRAM;  Surgeon: Excell Seltzer, MD;  Location: [facility];  Service: General;  Laterality: N/A;   ENDOMETRIAL ABLATION  08/25/2009   HER OPTION    Family History:  Family History  Problem Relation Age of Onset   Hypertension Father    Obesity Father    Cancer Maternal Grandfather        colon   Sleep apnea Mother    Heart disease Paternal Grandfather    Colon cancer Neg Hx    Family Psychiatric  History: See  admission H&P Social History:  Social History   Substance and Sexual Activity  Alcohol Use Yes   Alcohol/week: 1.0 standard drink   Types: 1 Glasses of wine per week   Comment: occasional glass of wine once per week     Social History   Substance and Sexual Activity  Drug Use No    Social History   Socioeconomic History   Marital status: Married    Spouse name: Not on file   Number of children: Not on file   Years of education: Not on file   Highest education level: Not on file  Occupational History   Not on file  Tobacco Use   Smoking status: Former    Pack years: 0.00    Types: Cigarettes    Quit date: 05/03/1979    Years since quitting: 42.0   Smokeless tobacco: Never  Vaping Use   Vaping Use: Never used  Substance and Sexual Activity   Alcohol use: Yes    Alcohol/week: 1.0 standard drink    Types: 1 Glasses of wine per week    Comment: occasional glass of wine once per week   Drug use: No   Sexual activity: Yes    Comment: TAH  Other Topics Concern   Not on file  Social History Narrative   Not on file   Social Determinants of Health   Financial Resource Strain: Not on file  Food Insecurity: Not on file  Transportation Needs: Not on file  Physical Activity: Not on file  Stress: Not on file  Social Connections: Not on file   Additional Social History:                         Sleep: Good  Appetite: Good  Current Medications: Current Facility-Administered Medications  Medication Dose Route Frequency Provider Last Rate Last Admin   acetaminophen (TYLENOL) tablet 650 mg  650 mg Oral Q6H PRN Prescilla Sours, PA-C   650 mg at 05/05/21 1420   ALPRAZolam (XANAX) tablet 0.5 mg  0.5 mg Oral TID PRN Ethelene Hal, NP   0.5 mg at 05/08/21 1359   alum & mag hydroxide-simeth (MAALOX/MYLANTA) 200-200-20 MG/5ML suspension 30 mL  30 mL Oral Q4H PRN [name] W, PA-C       buPROPion (WELLBUTRIN XL) 24 hr tablet 150 mg  150 mg Oral Daily Arthor Captain, MD   150 mg at 05/09/21 0804   feeding supplement (ENSURE ENLIVE / ENSURE PLUS) liquid 237 mL  237 mL Oral BID BM [name] W, PA-C   237 mL at 05/09/21 1007   [START ON 05/10/2021] FLUoxetine (PROZAC) capsule 40 mg  40 mg Oral Daily Arthor Captain, MD       lisinopril (ZESTRIL) tablet 10 mg  10 mg Oral Daily Prescilla Sours,  PA-C   10 mg at 05/09/21 1008   magnesium hydroxide (MILK OF MAGNESIA) suspension 30 mL  30 mL Oral Daily PRN [name] W, PA-C       melatonin tablet 10 mg  10 mg Oral QHS Ethelene Hal, NP   10 mg at 05/08/21 2106   multivitamin with minerals tablet 1 tablet  1 tablet Oral Daily Arthor Captain, MD   1 tablet at 05/09/21 0803   pantoprazole (PROTONIX) EC tablet 40 mg  40 mg Oral QHS [name] W, PA-C   40 mg at 05/08/21 2106   propranolol (INDERAL) tablet 10 mg  10 mg Oral BID Arthor Captain, MD   10 mg at 05/09/21 1658   thiamine tablet 100 mg  100 mg Oral Daily Arthor Captain, MD   100 mg at 05/09/21 0803   traZODone (DESYREL) tablet 50 mg  50 mg Oral QHS,MR X 1 Ethelene Hal, NP   50 mg at 05/08/21 2303   verapamil (CALAN-SR) CR tablet 180 mg  180 mg Oral QHS Prescilla Sours, PA-C   180 mg at 05/08/21 2106    Lab Results: No results found for this or any previous visit (from the past 65 hour(s)).  Blood Alcohol level:  No results found for: [facility]  Metabolic Disorder Labs: Lab Results  Component Value Date   HGBA1C 6.0 (H) 05/04/2021   MPG 126 05/04/2021   MPG 131 08/25/2018   No results found for: PROLACTIN Lab Results  Component Value Date   CHOL 118 05/04/2021   TRIG 109 05/04/2021   HDL 39 (L) 05/04/2021   CHOLHDL 3.0 05/04/2021   VLDL 22 05/04/2021   LDLCALC 57 05/04/2021    Physical Findings: AIMS:  , ,  ,  ,    CIWA:  CIWA-Ar Total: 1 COWS:     Musculoskeletal: Strength & Muscle Tone: within normal limits Gait & Station: normal Patient leans: N/A  Psychiatric Specialty Exam:  Presentation  General  Appearance: Appropriate for Environment; Neat  Eye Contact:Good  Speech:Clear and Coherent; Normal Rate  Speech Volume:Normal  Handedness:Left   Mood and Affect  Mood:Depressed; Anxious (Opening "better, more positive")  Affect:Congruent; Appropriate; Other (comment) (Some appropriate brightening)   Thought Process  Thought Processes:Coherent; Goal Directed  Descriptions of Associations:Intact  Orientation:Full (Time, Place and Person)  Thought Content:Logical; Perseveration (Perseveration on desire for discharge)  History of Schizophrenia/Schizoaffective disorder:No  Duration of Psychotic Symptoms:N/A Hallucinations:Hallucinations: None  Ideas of Reference:None  Suicidal Thoughts:Suicidal Thoughts: No  Homicidal Thoughts:Homicidal Thoughts: No   Sensorium  Memory:Recent Fair; Immediate Good; Remote Good  Judgment:Fair  Insight:Shallow; [place]  Concentration:Good  Attention Span:Good  [place] of Knowledge:Good  Language:Good   Psychomotor Activity  Psychomotor Activity:Psychomotor Activity: Normal   Assets  Assets:Communication Skills; Desire for Improvement; Physical Health; Resilience; Social Support; Catering manager; Housing; Leisure Time; Transportation   Sleep  Sleep:Sleep: Fair Number of Hours of Sleep: 5    Physical Exam: Physical Exam Vitals and nursing note reviewed.  Constitutional:      General: She is not in acute distress.    Appearance: Normal appearance. She is not diaphoretic.  HENT:     Head: Normocephalic and atraumatic.  Pulmonary:     Effort: Pulmonary effort is normal.  Neurological:     General: No focal deficit present.     Mental Status: She is alert and oriented to person, place, and time.   Review of Systems  Constitutional:  Negative for chills, diaphoresis and fever.  Respiratory:  Negative for cough and shortness of breath.   Cardiovascular:  Negative for  chest pain and palpitations.  Gastrointestinal:  Negative for constipation, diarrhea, nausea and vomiting.  Musculoskeletal: Negative.   Skin: Negative.   Neurological: Negative.   Psychiatric/Behavioral:  Positive for depression. Negative for hallucinations and suicidal ideas. The patient is nervous/anxious.    Blood pressure 117/72, pulse (!) 54, temperature 97.8 F (36.6 C), temperature source Oral, resp. rate 18, height 5' 4" (1.626 m), weight 101.6 kg, SpO2 98 %. Body mass index is 38.45 kg/m.   Treatment Plan Summary: Daily contact with patient to assess and evaluate symptoms and progress in treatment and Medication management  Continue every 15-minute observation status  Encourage participation in therapeutic milieu  Depression -Decrease Prozac to 40 mg daily (since Wellbutrin will likely slow the metabolism of Prozac thereby effectively increasing the level of the medication) -Continue Wellbutrin XL 150 mg daily  Anxiety -Continue alprazolam 0.5 mg 3 times daily PRN -Discontinue propranolol 10 mg every 12 hours given low heart rate and lack of efficacy. -Continue Vistaril 25 mg 3 times daily as needed  Insomnia  -Continue melatonin 10 mg at bedtime -continue trazodone 50 mg at bedtime PRN  Hypertension -Continue verapamil CR 180 mg nightly -Continue lisinopril 10 mg daily  GERD -Continue Protonix 40 mg daily  Social work is in daily contact with [place] [facility] about possible direct transfer to [facility] inpatient for inpatient ECT.  There currently is a waiting list for ECT at [facility].  Discharge planning in progress   Arthor Captain, MD 05/09/2021, 5:40 PM

## 2021-05-09 NOTE — BHH Group Notes (Signed)
ADULT GRIEF GROUP NOTE:   Spiritual care group on grief and loss facilitated by chaplain Janne Napoleon, [facility]   Group Goal:   Support / Education around grief and loss   Members engage in facilitated group support and psycho-social education.   Group Description:   Following introductions and group rules, group members engaged in facilitated group dialog and support around topic of loss, with particular support around experiences of loss in their lives. Group Identified types of loss (relationships / self / things) and identified patterns, circumstances, and changes that precipitate losses. Reflected on thoughts / feelings around loss, normalized grief responses, and recognized variety in grief experience. Group noted Worden's four tasks of grief in discussion.   Group drew on Adlerian / Rogerian, narrative, MI,   Patient Progress: Debbie attended and showed moderate participation.  She was an attentive listener as her peers shared.  [place], [place] Pager, [phone] 10:28 AM

## 2021-05-09 NOTE — BHH Counselor (Signed)
CSW contacted [facility] [phone] and was informed by the receptionist that there are approximately 2 discharges and 7 patients ahead of Alexis Wall on the list for the day.  The receptionist states that "it does not appear that we will be able to accept her today".

## 2021-05-09 NOTE — Plan of Care (Signed)
Nurse discussed anxiety, depression and coping skills with patient.  

## 2021-05-10 ENCOUNTER — Telehealth (HOSPITAL_COMMUNITY): Payer: Self-pay | Admitting: Psychiatry

## 2021-05-10 DIAGNOSIS — F332 Major depressive disorder, recurrent severe without psychotic features: Secondary | ICD-10-CM | POA: Diagnosis not present

## 2021-05-10 MED ORDER — TRAZODONE HCL 100 MG PO TABS
100.0000 mg | ORAL_TABLET | Freq: Every day | ORAL | Status: DC
  Administered 2021-05-10 – 2021-05-14 (×5): 100 mg via ORAL
  Filled 2021-05-10 (×8): qty 1

## 2021-05-10 MED ORDER — RAMELTEON 8 MG PO TABS
8.0000 mg | ORAL_TABLET | Freq: Every day | ORAL | Status: DC
  Administered 2021-05-10 – 2021-05-14 (×5): 8 mg via ORAL
  Filled 2021-05-10 (×8): qty 1

## 2021-05-10 MED ORDER — TRAZODONE HCL 50 MG PO TABS
50.0000 mg | ORAL_TABLET | Freq: Every evening | ORAL | Status: DC | PRN
  Administered 2021-05-10 – 2021-05-14 (×5): 50 mg via ORAL
  Filled 2021-05-10 (×5): qty 1

## 2021-05-10 NOTE — Progress Notes (Signed)
DAR NOTE: Pt present with flat affect and depressed mood in the unit. Pt denies physical pain, took all her meds as scheduled.  Pt's safety ensured with 15 minute and environmental checks. Pt currently denies SI/HI and A/V hallucinations. Pt verbally agrees to seek staff if SI/HI or A/VH occurs and to consult with staff before acting on these thoughts. Will continue POC.

## 2021-05-10 NOTE — BHH Counselor (Signed)
CSW contacted [facility] and was informed that they received the fax containing the information about the patient.  They also stated that their referrals do not carry over from day to day and that someone would need to call each morning.  The receptionist stated that "someone will need to call early because it is a first come, first served".  CSW was informed this morning that there were 8 referrals head of the patient and that there would be 4 discharges today.  CSW will call again tomorrow with another referral.

## 2021-05-10 NOTE — Progress Notes (Signed)
Adult Psychoeducational Group Note  Date:  05/10/2021 Time:  10:34 AM  Group Topic/Focus:  Goals Group:   The focus of this group is to help patients establish daily goals to achieve during treatment and discuss how the patient can incorporate goal setting into their daily lives to aide in recovery.  Participation Level:  Active  Participation Quality:  Appropriate and Attentive  Affect:  Appropriate  Cognitive:  Alert and Appropriate  Insight: Appropriate and Good  Engagement in Group:  Engaged  Modes of Intervention:  Discussion  Additional Comments:  Pt has a goal of continuing therapy after discharge. Pt is happy about how the sleep medication is working. Pt also says she will be working on her set of values.    Alexis Wall 05/10/2021, 10:34 AM

## 2021-05-10 NOTE — Progress Notes (Signed)
   05/10/21 0616  Vital Signs  Pulse Rate (!) 50  Resp 20  BP 112/63  BP Location Right Arm  BP Method Manual  Patient Position (if appropriate) Standing   D: Patient denies SI/HI/AVH.  Patient rated anxiety 3/10 and depression 2/10.  A:  Patient took scheduled medicine. Pt .given 0.5 Xanax x2 for anxiety. Pt reported being anxious because she could not sleep and there "was a lot going on/"  Support and encouragement provided Routine safety checks conducted every 15 minutes. Patient  Informed to notify staff with any concerns.   R: Safety maintained.

## 2021-05-10 NOTE — Telephone Encounter (Signed)
D:  Lynette, LCSW from [facility] called to refer pt to [facility].  A:  Pt is scheduled for CCA with Lorin Glass, LCSW on 05-23-21 @ 2pm (virtual).

## 2021-05-10 NOTE — Tx Team (Signed)
Interdisciplinary Treatment and Diagnostic Plan Update  05/10/2021 Time of Session:  Alexis Wall MRN: [id number]  Principal Diagnosis: MDD (major depressive disorder), recurrent severe, without psychosis ([place])  Secondary Diagnoses: Principal Problem:   MDD (major depressive disorder), recurrent severe, without psychosis ([place]) Active Problems:   Generalized anxiety disorder   Current Medications:  Current Facility-Administered Medications  Medication Dose Route Frequency Provider Last Rate Last Admin   acetaminophen (TYLENOL) tablet 650 mg  650 mg Oral Q6H PRN Prescilla Sours, PA-C   650 mg at 05/05/21 1420   ALPRAZolam (XANAX) tablet 0.5 mg  0.5 mg Oral TID PRN Ethelene Hal, NP   0.5 mg at 05/10/21 1032   alum & mag hydroxide-simeth (MAALOX/MYLANTA) 200-200-20 MG/5ML suspension 30 mL  30 mL Oral Q4H PRN [name] W, PA-C       buPROPion (WELLBUTRIN XL) 24 hr tablet 150 mg  150 mg Oral Daily Arthor Captain, MD   150 mg at 05/10/21 0815   feeding supplement (ENSURE ENLIVE / ENSURE PLUS) liquid 237 mL  237 mL Oral BID BM [name] W, PA-C   237 mL at 05/09/21 1007   FLUoxetine (PROZAC) capsule 40 mg  40 mg Oral Daily Arthor Captain, MD   40 mg at 05/10/21 0815   lisinopril (ZESTRIL) tablet 10 mg  10 mg Oral Daily [name] W, PA-C   10 mg at 05/10/21 1032   magnesium hydroxide (MILK OF MAGNESIA) suspension 30 mL  30 mL Oral Daily PRN [name] W, PA-C       melatonin tablet 10 mg  10 mg Oral QHS Ethelene Hal, NP   10 mg at 05/09/21 2113   multivitamin with minerals tablet 1 tablet  1 tablet Oral Daily Arthor Captain, MD   1 tablet at 05/10/21 0816   pantoprazole (PROTONIX) EC tablet 40 mg  40 mg Oral QHS [name] W, PA-C   40 mg at 05/09/21 2113   ramelteon (ROZEREM) tablet 8 mg  8 mg Oral QHS Arthor Captain, MD       thiamine tablet 100 mg  100 mg Oral Daily Arthor Captain, MD   100 mg at 05/10/21 0816   traZODone (DESYREL) tablet 100 mg  100 mg  Oral QHS Arthor Captain, MD       traZODone (DESYREL) tablet 50 mg  50 mg Oral QHS PRN Arthor Captain, MD       verapamil (CALAN-SR) CR tablet 180 mg  180 mg Oral QHS [name] W, PA-C   180 mg at 05/09/21 2113   PTA Medications: Medications Prior to Admission  Medication Sig Dispense Refill Last Dose   propranolol (INDERAL) 10 MG tablet Take 20 mg by mouth 3 (three) times daily. Patient reports that she takes this scheduled for anxiety.      ALPRAZolam (XANAX) 0.5 MG tablet Take 0.5 mg by mouth 3 (three) times daily.      clonazePAM (KLONOPIN) 1 MG tablet Take 1 mg by mouth 2 (two) times daily.      FLUoxetine (PROZAC) 20 MG capsule Take 60 mg by mouth daily.      lisinopril (ZESTRIL) 10 MG tablet Take 10 mg by mouth daily.      omeprazole (PRILOSEC) 20 MG capsule Take 20 mg by mouth at bedtime.       verapamil (VERELAN PM) 180 MG 24 hr capsule Take 180 mg by mouth at bedtime.  Miner  zolpidem (AMBIEN) 10 MG tablet Take 10 mg by mouth at bedtime. Takes an additional 5 mg if needed.       Patient Stressors: Financial difficulties  Patient Strengths: Ability for insight Motivation for treatment/growth Supportive family/friends  Treatment Modalities: Medication Management, Group therapy, Case management,  1 to 1 session with clinician, Psychoeducation, Recreational therapy.   Physician Treatment Plan for Primary Diagnosis: MDD (major depressive disorder), recurrent severe, without psychosis ([place]) Long Term Goal(s): Improvement in symptoms so as ready for discharge   Short Term Goals: Ability to identify changes in lifestyle to reduce recurrence of condition will improve Ability to verbalize feelings will improve Ability to disclose and discuss suicidal ideas Ability to demonstrate self-control will improve Ability to identify and develop effective coping behaviors will improve Compliance with prescribed medications will improve Ability to identify triggers associated with  substance abuse/mental health issues will improve  Medication Management: Evaluate patient's response, side effects, and tolerance of medication regimen.  Therapeutic Interventions: 1 to 1 sessions, Unit Group sessions and Medication administration.  Evaluation of Outcomes: Progressing  Physician Treatment Plan for Secondary Diagnosis: Principal Problem:   MDD (major depressive disorder), recurrent severe, without psychosis ([place]) Active Problems:   Generalized anxiety disorder  Long Term Goal(s): Improvement in symptoms so as ready for discharge   Short Term Goals: Ability to identify changes in lifestyle to reduce recurrence of condition will improve Ability to verbalize feelings will improve Ability to disclose and discuss suicidal ideas Ability to demonstrate self-control will improve Ability to identify and develop effective coping behaviors will improve Compliance with prescribed medications will improve Ability to identify triggers associated with substance abuse/mental health issues will improve     Medication Management: Evaluate patient's response, side effects, and tolerance of medication regimen.  Therapeutic Interventions: 1 to 1 sessions, Unit Group sessions and Medication administration.  Evaluation of Outcomes: Progressing   RN Treatment Plan for Primary Diagnosis: MDD (major depressive disorder), recurrent severe, without psychosis ([place]) Long Term Goal(s): Knowledge of disease and therapeutic regimen to maintain health will improve  Short Term Goals: Ability to verbalize feelings will improve, Ability to identify and develop effective coping behaviors will improve, and Compliance with prescribed medications will improve  Medication Management: RN will administer medications as ordered by provider, will assess and evaluate patient's response and provide education to patient for prescribed medication. RN will report any adverse and/or side effects to prescribing  provider.  Therapeutic Interventions: 1 on 1 counseling sessions, Psychoeducation, Medication administration, Evaluate responses to treatment, Monitor vital signs and CBGs as ordered, Perform/monitor CIWA, COWS, AIMS and Fall Risk screenings as ordered, Perform wound care treatments as ordered.  Evaluation of Outcomes: Progressing   LCSW Treatment Plan for Primary Diagnosis: MDD (major depressive disorder), recurrent severe, without psychosis ([place]) Long Term Goal(s): Safe transition to appropriate next level of care at discharge, Engage patient in therapeutic group addressing interpersonal concerns.  Short Term Goals: Engage patient in aftercare planning with referrals and resources, Facilitate acceptance of mental health diagnosis and concerns, and Identify triggers associated with mental health/substance abuse issues  Therapeutic Interventions: Assess for all discharge needs, 1 to 1 time with Social worker, Explore available resources and support systems, Assess for adequacy in community support network, Educate family and significant other(s) on suicide prevention, Complete Psychosocial Assessment, Interpersonal group therapy.  Evaluation of Outcomes: Progressing   Progress in Treatment: Attending groups: Yes. Participating in groups: Yes. Taking medication as prescribed: Yes. Toleration medication: Yes. Family/Significant other contact made: No,  will contact:  husband Patient understands diagnosis: Yes. and No. Discussing patient identified problems/goals with staff: Yes. Medical problems stabilized or resolved: Yes. Denies suicidal/homicidal ideation: Yes. Issues/concerns per patient self-inventory: No. Other: None  New problem(s) identified: No, Describe:  None  New Short Term/Long Term Goal(s):medication stabilization, elimination of SI thoughts, development of comprehensive mental wellness plan.   Patient Goals:  "to get to [facility] and get ECT"  Discharge Plan or Barriers:  Patient recently admitted. CSW will continue to follow and assess for appropriate referrals and possible discharge planning.   Reason for Continuation of Hospitalization: Anxiety Depression Medication stabilization  Estimated Length of Stay: 3-5 days  Attendees: Patient: Alexis Moll" Wall 05/10/2021   Physician: Myles Lipps, MD 05/10/2021   Nursing:  05/10/2021   RN Care Manager: 05/10/2021   Social Worker: Verdis Frederickson, [place] 05/10/2021   Recreational Therapist:  05/10/2021   Other:  05/10/2021   Other:  05/10/2021   Other: 05/10/2021     Scribe for Treatment Team: Darleen Crocker, [place] 05/10/2021 1:35 PM

## 2021-05-10 NOTE — Progress Notes (Signed)
Recreation Therapy Notes  Date: 6.15.22 Time: 0930 Location: [address] Dayroom  Group Topic: Stress Management   Goal Area(s) Addresses:  Patient will actively participate in stress management techniques presented during session.  Patient will successfully identify benefit of practicing stress management post d/c.   Behavioral Response: Appropriate  Intervention: Guided exercise with ambient sound and script  Activity :Guided Imagery  LRT provided education, instruction, and demonstration on practice of visualization via guided imagery. Patient was asked to participate in the technique introduced during session. LRT also debriefed including topics of mindfulness, stress management and specific scenarios each patient could use these techniques. Patients were given suggestions of ways to access scripts post d/c and encouraged to explore Youtube and other apps available on smartphones, tablets, and computers.   Education:  Stress Management, Discharge Planning.   Education Outcome: Acknowledges education  Clinical Observations/Feedback: Patient actively engaged in technique introduced, expressed no concerns but did express during the guided imagery, she envisioned herself riding a horse to relax.   Victorino Sparrow, LRT/CTRS     Ria Comment, Lennard Capek A 05/10/2021 11:20 AM

## 2021-05-10 NOTE — Progress Notes (Signed)
[facility] MD Progress Note  05/10/2021 12:30 PM Alexis Wall  MRN:  [id number]  Reason for admission:  Alexis Wall is a 55 year old female with a history of major depressive disorder and anxiety who presented to [facility] ED on 05/04/21 following an overdose on Prozac, Ambien and Xanax in a suicide attempt.  There are conflicting numbers of tablets documented as having been taken in the overdose in the EMR.  The patient reported on assessment in the ED that she planned out the attempt and wrote suicide notes to family members.  She was transferred to [facility] for further treatment of depression.  Objective: Medical record reviewed.  Patient's case discussed in detail with members of the treatment team.  I met with and evaluated the patient on the unit today for follow-up.  Patient continues to present as improving and appears less depressed, less anxious with some appropriate brightening of affect, good eye contact and more positive outlook.  She subjectively reports that her mood is better and she feels less anxious and less depressed overall.  The patient has been able to get interested or enjoy parts of her day in the hospital despite wanting to go home.  She was able to enjoy the pet therapy visit and participating in groups yesterday.  She denies passive wishes not to be alive or suicidal ideation.  She reports difficulty sleeping last night with trouble with sleep onset and states that she never fell asleep although she felt physically tired.  Patient has had chronic sleep problems and typically takes between 10 and 15 mg of Ambien per night at home.  She has been using her CPAP while in the hospital but still has had trouble sleeping.  She states that lights and sounds from the hallway and anxiety about getting a new roommate contributed to her sleep difficulty last night.  We discussed trying an increase standing dose of trazodone as well as adding standing dose Rozerem at night to help her sleep.  Patient is  agreeable to this plan.  I advised patient to consider partial hospital program participation after discharge from the inpatient unit until she is able to get ECT as an outpatient if direct transfer for ECT as an inpatient is not possible.  Patient states she will consider but she is uncertain she wants to participate in PHP.  She does not have a therapist and would like a therapy referral.  Team social worker is aware and will make referral.  Patient denies any physical problems.  She denies any medication side effects but states she is uncertain as to whether her sleep difficulty could be due to Wellbutrin.  I advised her that it is possible Wellbutrin is contributing to her sleep difficulty but she had pre-existing problems sleeping and we will continue to monitor.  Patient stated agreement with this plan.  The patient slept 3.5 hours last night.  She has been taking standing dose medications as prescribed.  Nursing notes document that patient intermittently continues to present with flat affect and depressed mood on the unit.  She took no PRN medications yesterday and took a single dose of Xanax 0.5 mg this morning for anxiety.  Vital signs have been stable and patient continues to have bradycardia which she has had since prior to admission.  Principal Problem: MDD (major depressive disorder), recurrent severe, without psychosis ([place]) Diagnosis: Principal Problem:   MDD (major depressive disorder), recurrent severe, without psychosis ([place]) Active Problems:   Generalized anxiety disorder  Total Time  spent with patient: 25 minutes  Past Psychiatric History: See admission H&P  Past Medical History:  Past Medical History:  Diagnosis Date   Anemia    Arthritis    pains in knee   Bipolar disorder ([place])    per Dr D.Patterson's office note 07/08/2007   Depression    [facility]   Diabetes mellitus    GESTATIONAL-not now   GERD (gastroesophageal reflux disease)    Hypertension    Morbid  obesity ([place])    Polyp of colon    Sleep apnea    Wears CPAP    Past Surgical History:  Procedure Laterality Date   ABDOMINAL HYSTERECTOMY  03/21/2010   TAH   BREATH TEK H PYLORI  08/05/2012   Procedure: BREATH TEK H PYLORI;  Surgeon: Pedro Earls, MD;  Location: [facility];  Service: General;  Laterality: N/A;  [place]  01/26/1999, 09/30/2002   X2.Marland Kitchen WITH BTSP   CHOLECYSTECTOMY N/A 08/27/2018   Procedure: LAPAROSCOPIC CHOLECYSTECTOMY WITH INTRAOPERATIVE CHOLANGIOGRAM;  Surgeon: Excell Seltzer, MD;  Location: [facility];  Service: General;  Laterality: N/A;   ENDOMETRIAL ABLATION  08/25/2009   HER OPTION    Family History:  Family History  Problem Relation Age of Onset   Hypertension Father    Obesity Father    Cancer Maternal Grandfather        colon   Sleep apnea Mother    Heart disease Paternal Grandfather    Colon cancer Neg Hx    Family Psychiatric  History: See admission H&P Social History:  Social History   Substance and Sexual Activity  Alcohol Use Yes   Alcohol/week: 1.0 standard drink   Types: 1 Glasses of wine per week   Comment: occasional glass of wine once per week     Social History   Substance and Sexual Activity  Drug Use No    Social History   Socioeconomic History   Marital status: Married    Spouse name: Not on file   Number of children: Not on file   Years of education: Not on file   Highest education level: Not on file  Occupational History   Not on file  Tobacco Use   Smoking status: Former    Pack years: 0.00    Types: Cigarettes    Quit date: 05/03/1979    Years since quitting: 42.0   Smokeless tobacco: Never  Vaping Use   Vaping Use: Never used  Substance and Sexual Activity   Alcohol use: Yes    Alcohol/week: 1.0 standard drink    Types: 1 Glasses of wine per week    Comment: occasional glass of wine once per week   Drug use: No   Sexual activity: Yes    Comment: TAH  Other Topics Concern   Not on file   Social History Narrative   Not on file   Social Determinants of Health   Financial Resource Strain: Not on file  Food Insecurity: Not on file  Transportation Needs: Not on file  Physical Activity: Not on file  Stress: Not on file  Social Connections: Not on file   Additional Social History:                         Sleep: Good  Appetite: Good  Current Medications: Current Facility-Administered Medications  Medication Dose Route Frequency Provider Last Rate Last Admin   acetaminophen (TYLENOL) tablet 650 mg  650 mg Oral  Q6H PRN Prescilla Sours, PA-C   650 mg at 05/05/21 1420   ALPRAZolam (XANAX) tablet 0.5 mg  0.5 mg Oral TID PRN Ethelene Hal, NP   0.5 mg at 05/10/21 1032   alum & mag hydroxide-simeth (MAALOX/MYLANTA) 200-200-20 MG/5ML suspension 30 mL  30 mL Oral Q4H PRN [name] W, PA-C       buPROPion (WELLBUTRIN XL) 24 hr tablet 150 mg  150 mg Oral Daily Arthor Captain, MD   150 mg at 05/10/21 0815   feeding supplement (ENSURE ENLIVE / ENSURE PLUS) liquid 237 mL  237 mL Oral BID BM [name] W, PA-C   237 mL at 05/09/21 1007   FLUoxetine (PROZAC) capsule 40 mg  40 mg Oral Daily Arthor Captain, MD   40 mg at 05/10/21 0815   lisinopril (ZESTRIL) tablet 10 mg  10 mg Oral Daily [name] W, PA-C   10 mg at 05/10/21 1032   magnesium hydroxide (MILK OF MAGNESIA) suspension 30 mL  30 mL Oral Daily PRN [name] W, PA-C       melatonin tablet 10 mg  10 mg Oral QHS Ethelene Hal, NP   10 mg at 05/09/21 2113   multivitamin with minerals tablet 1 tablet  1 tablet Oral Daily Arthor Captain, MD   1 tablet at 05/10/21 0816   pantoprazole (PROTONIX) EC tablet 40 mg  40 mg Oral QHS [name] W, PA-C   40 mg at 05/09/21 2113   ramelteon (ROZEREM) tablet 8 mg  8 mg Oral QHS Arthor Captain, MD       thiamine tablet 100 mg  100 mg Oral Daily Arthor Captain, MD   100 mg at 05/10/21 0816   traZODone (DESYREL) tablet 100 mg  100 mg Oral QHS Arthor Captain, MD       verapamil (CALAN-SR) CR tablet 180 mg  180 mg Oral QHS [name] W, PA-C   180 mg at 05/09/21 2113    Lab Results: No results found for this or any previous visit (from the past 7 hour(s)).  Blood Alcohol level:  No results found for: [facility]  Metabolic Disorder Labs: Lab Results  Component Value Date   HGBA1C 6.0 (H) 05/04/2021   MPG 126 05/04/2021   MPG 131 08/25/2018   No results found for: PROLACTIN Lab Results  Component Value Date   CHOL 118 05/04/2021   TRIG 109 05/04/2021   HDL 39 (L) 05/04/2021   CHOLHDL 3.0 05/04/2021   VLDL 22 05/04/2021   LDLCALC 57 05/04/2021    Physical Findings: AIMS:  , ,  ,  ,    CIWA:  CIWA-Ar Total: 1 COWS:     Musculoskeletal: Strength & Muscle Tone: within normal limits Gait & Station: normal Patient leans: N/A  Psychiatric Specialty Exam:  Presentation  General Appearance: Appropriate for Environment  Eye Contact:Good  Speech:Clear and Coherent; Normal Rate  Speech Volume:Normal  Handedness:Left   Mood and Affect  Mood:Depressed; Anxious ("better"  Less anxious, less depressed)  Affect:Appropriate; Other (comment) (Some appropriate brightening to certain topics)   Thought Process  Thought Processes:Coherent; Goal Directed  Descriptions of Associations:Intact  Orientation:Full (Time, Place and Person)  Thought Content:Logical  History of Schizophrenia/Schizoaffective disorder:No  Duration of Psychotic Symptoms:N/A Hallucinations:Hallucinations: None  Ideas of Reference:None  Suicidal Thoughts:Suicidal Thoughts: No  Homicidal Thoughts:Homicidal Thoughts: No   Sensorium  Memory:Immediate Good; Recent Fair; Remote Good  Judgment:Fair  Insight:Present   Executive  Functions  Concentration:Good  Attention Span:Good  [place] of Knowledge:Good  Language:Good   Psychomotor Activity  Psychomotor Activity:Psychomotor Activity: Normal   Assets  Assets:Communication  Skills; Desire for Improvement; Housing; Catering manager; Physical Health; Resilience; Social Support; Transportation   Sleep  Sleep:Sleep: Fair Number of Hours of Sleep: 3.5    Physical Exam: Physical Exam Vitals and nursing note reviewed.  Constitutional:      General: She is not in acute distress.    Appearance: Normal appearance.  HENT:     Head: Normocephalic and atraumatic.  Pulmonary:     Effort: Pulmonary effort is normal.  Neurological:     General: No focal deficit present.     Mental Status: She is alert and oriented to person, place, and time.   Review of Systems  Constitutional:  Negative for chills, diaphoresis and fever.  Respiratory:  Negative for cough and shortness of breath.   Cardiovascular:  Negative for chest pain and palpitations.  Gastrointestinal:  Negative for constipation, diarrhea, nausea and vomiting.  Musculoskeletal: Negative.   Skin: Negative.   Neurological: Negative.   Psychiatric/Behavioral:  Positive for depression. Negative for hallucinations and suicidal ideas. The patient is nervous/anxious and has insomnia.   All other systems reviewed and are negative.  Blood pressure 139/72, pulse (!) 53, temperature 97.9 F (36.6 C), temperature source Oral, resp. rate 20, height '5\' 4"'  (1.626 m), weight 101.6 kg, SpO2 98 %. Body mass index is 38.45 kg/m.   Treatment Plan Summary: Daily contact with patient to assess and evaluate symptoms and progress in treatment and Medication management  Continue on IVC   Continue every 15-minute observation status  Encourage participation in group therapy and therapeutic milieu.    Depression -Continue Prozac 40 mg daily  -Continue Wellbutrin XL 150 mg daily  Anxiety -Continue alprazolam 0.5 mg 3 times daily PRN -Discontinue propranolol 10 mg every 12 hours given low heart rate and lack of efficacy. -Continue Vistaril 25 mg 3 times daily as needed  Insomnia  -Continue melatonin 10 mg  at bedtime -Start Rozerem 8 mg at bedtime -Increase standing dose trazodone to 100 mg at bedtime  -Continue trazodone 50 mg at bedtime PRN given at least 1 hour after standing dose trazodone if needed for sleep  Hypertension -Continue verapamil CR 180 mg nightly -Continue lisinopril 10 mg daily  GERD -Continue Protonix 40 mg daily  Social work is assisting with PHP referral and with outpatient therapy referral.    Social work is in daily contact with [place] psychiat[facility] about possible direct transfer to [facility] inpatient for inpatient ECT.  There currently is a waiting list for ECT at [facility].  Discharge planning in progress   Arthor Captain, MD 05/10/2021, 12:30 PM

## 2021-05-10 NOTE — Progress Notes (Signed)
Patient rated her day as a 6 out of 10 since she did not sleep well last night. She had a good talk with her doctor though. Her goal for tomorrow is to be "more refreshed".

## 2021-05-11 DIAGNOSIS — F332 Major depressive disorder, recurrent severe without psychotic features: Secondary | ICD-10-CM | POA: Diagnosis not present

## 2021-05-11 MED ORDER — ZOLPIDEM TARTRATE 5 MG PO TABS
5.0000 mg | ORAL_TABLET | Freq: Every day | ORAL | Status: DC
  Administered 2021-05-11 – 2021-05-14 (×4): 5 mg via ORAL
  Filled 2021-05-11 (×4): qty 1

## 2021-05-11 NOTE — Progress Notes (Signed)
[place] Group Notes:  (Nursing/MHT/Case Management/Adjunct)  Date:  05/11/2021  Time:  2015  Type of Therapy:   wrap up group  Participation Level:  Active  Participation Quality:  Appropriate, Attentive, Sharing, and Supportive  Affect:  Depressed  Cognitive:  Alert  Insight:  Improving  Engagement in Group:  Engaged  Modes of Intervention:  Clarification, Education, and Support  Summary of Progress/Problems: Positive thinking and positive change were discussed.   Shellia Cleverly 05/11/2021, 10:10 PM

## 2021-05-11 NOTE — Plan of Care (Signed)
Nurse discussed anxiety, depression and coping skills with patient.  

## 2021-05-11 NOTE — Progress Notes (Signed)
[facility] MD Progress Note  05/11/2021 4:03 PM Alexis Wall  MRN:  [id number]  Reason for admission:  Alexis Wall is a 55 year old female with a history of major depressive disorder and anxiety who presented to [facility] ED on 05/04/21 following an overdose on Prozac, Ambien and Xanax in a suicide attempt.  There are conflicting numbers of tablets documented as having been taken in the overdose in the EMR.  The patient reported on assessment in the ED that she planned out the attempt and wrote suicide notes to family members.  She was transferred to [facility] for further treatment of depression.  Objective: Medical record reviewed.  Patient's case discussed in detail with members of the treatment team.  I met with and evaluated the patient on the unit today for follow-up.  Patient appears improved.  Affect is brighter and she maintains good eye contact.  Patient continues to present as anxious and takes frequent notes during our conversation.  She reports that her mood is better, "pretty good" and feels less depressed and less anxious.  She believes the Wellbutrin has been helpful.  Her energy has been better since she started it.  She denies anhedonia and reports that she can get interested in groups and interactions with select peers.  She continues to report having difficulty sleeping and requests to resume her Ambien.  She was taking up to 15 mg of Ambien for sleep as an outpatient.  I discussed with her that we would resume it at 5 mg tonight and give it in addition to trazodone and Rozerem.  She is receptive to this plan.  She denies medication side effects or physical problems.  Patient reports that her husband spoke with the [place] outpatient ECT clinic and she has an appointment with Dr Woody Seller on 05/17/2021 for an ECT consult and also has an appointment with her outpatient psychiatrist at [facility] on 05/18/2021.  She perseverates on obtaining a definite discharge date.  I encouraged patient to continue to pursue PHP  referral since it is unclear when patient would start ECT as an outpatient after her ECT consult.  She is ambivalent about PHP but will consider.  The patient slept 5.5 hours last night.  Her vital signs are stable and within normal limits.  Bradycardia has resolved since propranolol discontinuation.  No new labs today.  She has taken scheduled medications as prescribed.  She took PRN followed by PRN alprazolam last night due to difficulty sleeping.  She has taken a single dose of PRN alprazolam today for anxiety.  Nursing notes document that she continues to present as anxious, particularly about sleep.  Principal Problem: MDD (major depressive disorder), recurrent severe, without psychosis ([place]) Diagnosis: Principal Problem:   MDD (major depressive disorder), recurrent severe, without psychosis ([place]) Active Problems:   Generalized anxiety disorder  Total Time spent with patient: 25 minutes  Past Psychiatric History: See admission H&P  Past Medical History:  Past Medical History:  Diagnosis Date   Anemia    Arthritis    pains in knee   Bipolar disorder ([place])    per Dr D.Patterson's office note 07/08/2007   Depression    [facility]   Diabetes mellitus    GESTATIONAL-not now   GERD (gastroesophageal reflux disease)    Hypertension    Morbid obesity ([place])    Polyp of colon    Sleep apnea    Wears CPAP    Past Surgical History:  Procedure Laterality Date   ABDOMINAL HYSTERECTOMY  03/21/2010   TAH   BREATH TEK H PYLORI  08/05/2012   Procedure: BREATH TEK H PYLORI;  Surgeon: Pedro Earls, MD;  Location: [facility];  Service: General;  Laterality: N/A;  [place]  01/26/1999, 09/30/2002   X2.Marland Kitchen WITH BTSP   CHOLECYSTECTOMY N/A 08/27/2018   Procedure: LAPAROSCOPIC CHOLECYSTECTOMY WITH INTRAOPERATIVE CHOLANGIOGRAM;  Surgeon: Excell Seltzer, MD;  Location: [facility];  Service: General;  Laterality: N/A;   ENDOMETRIAL ABLATION  08/25/2009   HER OPTION    Family  History:  Family History  Problem Relation Age of Onset   Hypertension Father    Obesity Father    Cancer Maternal Grandfather        colon   Sleep apnea Mother    Heart disease Paternal Grandfather    Colon cancer Neg Hx    Family Psychiatric  History: See admission H&P Social History:  Social History   Substance and Sexual Activity  Alcohol Use Yes   Alcohol/week: 1.0 standard drink   Types: 1 Glasses of wine per week   Comment: occasional glass of wine once per week     Social History   Substance and Sexual Activity  Drug Use No    Social History   Socioeconomic History   Marital status: Married    Spouse name: Not on file   Number of children: Not on file   Years of education: Not on file   Highest education level: Not on file  Occupational History   Not on file  Tobacco Use   Smoking status: Former    Pack years: 0.00    Types: Cigarettes    Quit date: 05/03/1979    Years since quitting: 42.0   Smokeless tobacco: Never  Vaping Use   Vaping Use: Never used  Substance and Sexual Activity   Alcohol use: Yes    Alcohol/week: 1.0 standard drink    Types: 1 Glasses of wine per week    Comment: occasional glass of wine once per week   Drug use: No   Sexual activity: Yes    Comment: TAH  Other Topics Concern   Not on file  Social History Narrative   Not on file   Social Determinants of Health   Financial Resource Strain: Not on file  Food Insecurity: Not on file  Transportation Needs: Not on file  Physical Activity: Not on file  Stress: Not on file  Social Connections: Not on file   Additional Social History:                         Sleep: Fair to poor  Appetite: Good  Current Medications: Current Facility-Administered Medications  Medication Dose Route Frequency Provider Last Rate Last Admin   acetaminophen (TYLENOL) tablet 650 mg  650 mg Oral Q6H PRN Prescilla Sours, PA-C   650 mg at 05/05/21 1420   ALPRAZolam (XANAX) tablet 0.5 mg   0.5 mg Oral TID PRN Ethelene Hal, NP   0.5 mg at 05/11/21 1255   alum & mag hydroxide-simeth (MAALOX/MYLANTA) 200-200-20 MG/5ML suspension 30 mL  30 mL Oral Q4H PRN [name] W, PA-C       buPROPion (WELLBUTRIN XL) 24 hr tablet 150 mg  150 mg Oral Daily Arthor Captain, MD   150 mg at 05/11/21 0900   feeding supplement (ENSURE ENLIVE / ENSURE PLUS) liquid 237 mL  237 mL Oral BID BM Prescilla Sours, PA-C  237 mL at 05/11/21 1009   FLUoxetine (PROZAC) capsule 40 mg  40 mg Oral Daily Arthor Captain, MD   40 mg at 05/11/21 0900   lisinopril (ZESTRIL) tablet 10 mg  10 mg Oral Daily [name] W, PA-C   10 mg at 05/11/21 1011   magnesium hydroxide (MILK OF MAGNESIA) suspension 30 mL  30 mL Oral Daily PRN Prescilla Sours, PA-C       multivitamin with minerals tablet 1 tablet  1 tablet Oral Daily Arthor Captain, MD   1 tablet at 05/11/21 0900   pantoprazole (PROTONIX) EC tablet 40 mg  40 mg Oral QHS [name] W, PA-C   40 mg at 05/10/21 2135   ramelteon (ROZEREM) tablet 8 mg  8 mg Oral QHS Arthor Captain, MD   8 mg at 05/10/21 2206   thiamine tablet 100 mg  100 mg Oral Daily Arthor Captain, MD   100 mg at 05/11/21 0900   traZODone (DESYREL) tablet 100 mg  100 mg Oral QHS Arthor Captain, MD   100 mg at 05/10/21 2206   traZODone (DESYREL) tablet 50 mg  50 mg Oral QHS PRN Arthor Captain, MD   50 mg at 05/10/21 2335   verapamil (CALAN-SR) CR tablet 180 mg  180 mg Oral QHS Prescilla Sours, PA-C   180 mg at 05/10/21 2135   zolpidem (AMBIEN) tablet 5 mg  5 mg Oral QHS Arthor Captain, MD        Lab Results: No results found for this or any previous visit (from the past 48 hour(s)).  Blood Alcohol level:  No results found for: [facility]  Metabolic Disorder Labs: Lab Results  Component Value Date   HGBA1C 6.0 (H) 05/04/2021   MPG 126 05/04/2021   MPG 131 08/25/2018   No results found for: PROLACTIN Lab Results  Component Value Date   CHOL 118 05/04/2021   TRIG 109 05/04/2021   HDL 39  (L) 05/04/2021   CHOLHDL 3.0 05/04/2021   VLDL 22 05/04/2021   LDLCALC 57 05/04/2021    Physical Findings: AIMS:  , ,  ,  ,    CIWA:  CIWA-Ar Total: 1 COWS:     Musculoskeletal: Strength & Muscle Tone: within normal limits Gait & Station: normal Patient leans: N/A  Psychiatric Specialty Exam:  Presentation  General Appearance: Appropriate for Environment  Eye Contact:Good  Speech:Clear and Coherent; Normal Rate  Speech Volume:Normal  Handedness:Left   Mood and Affect  Mood:-- ("Better, pretty good" less anxious, less depressed)  Affect:Other (comment); Congruent Programmer, applications)   Thought Process  Thought Processes:Coherent; Goal Directed  Descriptions of Associations:Intact  Orientation:Full (Time, Place and Person)  Thought Content:Logical  History of Schizophrenia/Schizoaffective disorder:No  Duration of Psychotic Symptoms:N/A Hallucinations:Hallucinations: None  Ideas of Reference:None  Suicidal Thoughts:Suicidal Thoughts: No  Homicidal Thoughts:Homicidal Thoughts: No   Sensorium  Memory:Immediate Good; Recent Fair; Remote Good  Judgment:Fair  Insight:Fair   Executive Functions  Concentration:Good  Attention Span:Good  [place] of Knowledge:Good  Language:Good   Psychomotor Activity  Psychomotor Activity:Psychomotor Activity: Normal   Assets  Assets:Communication Skills; Desire for Improvement; Financial Resources/Insurance; Housing; Social Support; Transportation   Sleep  Sleep:Sleep: Fair Number of Hours of Sleep: 5.5    Physical Exam: Physical Exam Vitals and nursing note reviewed.  Constitutional:      General: She is not in acute distress.    Appearance: Normal appearance.  HENT:     Head: Normocephalic  and atraumatic.  Pulmonary:     Effort: Pulmonary effort is normal.  Neurological:     General: No focal deficit present.     Mental Status: She is alert and oriented to person, place, and time.    Review of Systems  Constitutional:  Negative for chills, diaphoresis and fever.  Respiratory:  Negative for cough and shortness of breath.   Cardiovascular:  Negative for chest pain and palpitations.  Gastrointestinal:  Negative for constipation, diarrhea, nausea and vomiting.  Musculoskeletal: Negative.   Skin: Negative.   Neurological: Negative.   Psychiatric/Behavioral:  Positive for depression. Negative for hallucinations and suicidal ideas. The patient is nervous/anxious and has insomnia.   All other systems reviewed and are negative.  Blood pressure 121/69, pulse 69, temperature 97.6 F (36.4 C), temperature source Oral, resp. rate 20, height '5\' 4"'  (1.626 m), weight 101.6 kg, SpO2 98 %. Body mass index is 38.45 kg/m.   Treatment Plan Summary: Daily contact with patient to assess and evaluate symptoms and progress in treatment and Medication management  Continue on IVC   Continue every 15-minute observation status  Encourage participation in group therapy and therapeutic milieu.    Depression -Continue Prozac 40 mg daily  -Continue Wellbutrin XL 150 mg daily  Anxiety -Continue alprazolam 0.5 mg 3 times daily PRN -Continue Vistaril 25 mg 3 times daily as needed  Insomnia  -Discontinue melatonin 10 mg at bedtime -Continue Rozerem 8 mg at bedtime -Continue standing dose trazodone 100 mg at bedtime  -Start Ambien 5 mg at bedtime -Continue trazodone 50 mg at bedtime PRN given at least 1 hour after standing dose trazodone if needed for sleep  Hypertension -Continue verapamil CR 180 mg nightly -Continue lisinopril 10 mg daily  GERD -Continue Protonix 40 mg daily  Social work is assisting with PHP referral and with outpatient therapy referral.    Social work is in daily contact with [place] [facility] about possible direct transfer to [facility] inpatient for inpatient ECT.  There currently is a waiting list for ECT at [facility]  Discharge planning in progress   Arthor Captain, MD 05/11/2021, 4:03 PM

## 2021-05-11 NOTE — Plan of Care (Signed)
  Problem: Education: Goal: Verbalization of understanding the information provided will improve Outcome: Progressing   Problem: Activity: Goal: Sleeping patterns will improve Outcome: Not Progressing   Problem: Coping: Goal: Ability to demonstrate self-control will improve Outcome: Not Progressing

## 2021-05-11 NOTE — Progress Notes (Signed)
D:  Patient's self inventory sheet, patient has poor sleep, sleep medication not helpful.  Good appetite, low energy level, good concentration.  Rated depression and hopeless 3, anxiety 5.  Denied withdrawals.  Denied SI.  Denied physical problems.  Denied physical pain.  Goal is sleep tonight.  Plans to talk to Dr. Jeneen Rinks.  No discharge plans. A:  Medications administered per MD orders.  Emotional support and encouragement given patient. R:  Denied SI and HI, contracts for safety.  Denied A/V hallucinations.  Safety maintained with15 minute checks.

## 2021-05-11 NOTE — [facility] Group Notes (Signed)
[facility] Group Notes:  (Nursing/MHT/Case Management/Adjunct)  Date:  05/11/2021  Time:  10:15 AM  Type of Therapy:   Goals Group  Participation Level:  Active  Participation Quality:  Appropriate, Attentive, and Sharing  Affect:  Anxious  Cognitive:  Alert and Appropriate  Insight:  Improving  Engagement in Group:  Engaged  Modes of Intervention:  Discussion and Education  Summary of Progress/Problems:  She reported her goal today is "to be able to go to sleep."  She continues to report sleep is poor.  Barbette Or Tejay Hubert 05/11/2021, 10:15 AM

## 2021-05-11 NOTE — Progress Notes (Addendum)
  D:  Pt present with moderate anxiety and depression.  Pt anxiety  persisted.  Pt states, "I am worrying about not getting sleep."  Pt administered 50 mg Trazodone PRN, and 0.5 mg Xanax PRN per MAR.  Pt denies SI/HI, but verbally contracts for safety.  Pt denies AVH.  A:  Labs/Vitals monitored; Medication education provided; Pt encouraged to communicate concerns.  R:  Pt remains safe on unit with q15 minute safety checks; Will continue POC.      05/10/21 2135  Psych Admission Type (Psych Patients Only)  Admission Status Involuntary  Psychosocial Assessment  Patient Complaints Anxiety;Depression  Eye Contact Fair  Facial Expression Anxious  Affect Anxious;Preoccupied  Speech Logical/coherent;Soft  Interaction Assertive  Motor Activity Slow;Unsteady  Appearance/Hygiene In scrubs  Behavior Characteristics Cooperative  Mood Depressed;Anxious  Thought Process  Coherency WDL  Content Blaming self  Delusions None reported or observed  Perception WDL  Hallucination None reported or observed  Judgment Poor  Confusion None  Danger to Self  Current suicidal ideation? Denies  Self-Injurious Behavior No self-injurious ideation or behavior indicators observed or expressed   Agreement Not to Harm Self Yes  Description of Agreement Verbal contract  Danger to Others  Danger to Others None reported or observed

## 2021-05-11 NOTE — B[facility] Counselor (Signed)
CSW spoke with [facility] and was informed that there is only 1 discharge today and 5 people ahead of Alexis Wall.  CSW asked the receptionist what would need to happen to get Alexis Wall into the program and was informed that there was not another process that would help her get into the program quicker.  CSW inquired about outpatient ECT and was informed by the receptionist that she did not know the number or where they were located but was aware that there was an outpatient option.  CSW will look into this outpatient option.

## 2021-05-11 NOTE — [facility] Counselor (Signed)
CSW spoke with [facility] [phone] and given the information to send in a referral.  CSW was told that the appointments are currently being scheduled into August.  CSW will completed this referral for the patient and will attempt to get an appointment scheduled prior to discharge.

## 2021-05-12 DIAGNOSIS — F332 Major depressive disorder, recurrent severe without psychotic features: Secondary | ICD-10-CM | POA: Diagnosis not present

## 2021-05-12 NOTE — Progress Notes (Signed)
     05/12/21 0100  Psych Admission Type (Psych Patients Only)  Admission Status Involuntary  Psychosocial Assessment  Patient Complaints Anxiety;Depression;Hopelessness  Eye Contact Fair  Facial Expression Anxious  Affect Anxious;Preoccupied  Speech Logical/coherent;Soft  Interaction Assertive  Motor Activity Slow  Appearance/Hygiene In scrubs  Behavior Characteristics Anxious  Mood Depressed;Anxious  Thought Process  Coherency WDL  Content Blaming self  Delusions None reported or observed  Perception WDL  Hallucination None reported or observed  Judgment Poor  Confusion None  Danger to Self  Current suicidal ideation? Denies  Self-Injurious Behavior No self-injurious ideation or behavior indicators observed or expressed   Agreement Not to Harm Self Yes  Description of Agreement Verbal contract  Danger to Others  Danger to Others None reported or observed

## 2021-05-12 NOTE — Progress Notes (Signed)
[facility] MD Progress Note  05/12/2021 5:02 PM BRALEE FELDT  MRN:  [id number]  Reason for admission:  Alexis Wall is a 55 year old female with a history of major depressive disorder and anxiety who presented to [facility] ED on 05/04/21 following an overdose on Prozac, Ambien and Xanax in a suicide attempt.  There are conflicting numbers of tablets documented as having been taken in the overdose in the EMR.  The patient reported on assessment in the ED that she planned out the attempt and wrote suicide notes to family members.  She was transferred to [facility] for further treatment of depression.  Objective: Medical record reviewed.  Patient's case discussed in detail with members of the treatment team.  I met with and evaluated the patient on the unit today for follow-up.  Patient presents as mildly anxious and depressed but much improved from admission with brighter affect.  She reports that her mood is "okay, less depressed" and less anxious.  She denies suicidal ideation.  Patient reports sleeping better last night.  Appetite is okay.  Reports she is able to get interest or enjoyment out of some interactions on the unit or some groups.  She is eager to go home.  Patient states that her husband came back from out of town last night and is available by phone today and request that I give him a call.  She denies any problems with her medications or physical problems.  The patient slept 4 hours last night.  She took PRN trazodone last night for sleep and took PRN Xanax yesterday at 1 PM and last night at around midnight.  She taken 1 dose of PRN Xanax today thus far at 3 PM.  I made direct phone contact today with patient's husband at patient's request and with her permission.  I spoke with Mr. Alexis Wall [phone]) for approximately 20 to 25 minutes this morning.  We discussed patient's diagnosis, current symptoms, current treatment and medication, plan for treatment after discharge, anticipated outcomes from  treatment and concerns about her safety after discharge from the hospital.  Mr. Point states that he will need to move furniture out of the patient's deceased father's home this weekend and he expressed concern that he will not be able to be is vigilant in observing patient over the weekend if she is discharged.  [name] also expressed concern that he feels patient is likely to be upset if she goes to her deceased father's home.  He would feel more comfortable if patient is discharged early next week.  [name] confirms that patient has an appointment on Wednesday of next week with outpatient ECT clinic at [facility] and on Thursday of next week with her psychiatrist at [facility].  He states that he plans to secure her medications and secure any weapons in the home in preparation for her discharge.  We discussed Alexis Wall's improvement with regard to her anxiety and depression as well as her risk factors for future suicide attempt.  Mr. Alexis Wall was provided an opportunity to ask questions and verbalize concerns.  His questions were answered.  He stated understanding of the information discussed and expressed appreciation for the phone call and for the care the patient has been receiving in the hospital.   Principal Problem: MDD (major depressive disorder), recurrent severe, without psychosis ([place]) Diagnosis: Principal Problem:   MDD (major depressive disorder), recurrent severe, without psychosis (Alexis Wall) Active Problems:   Generalized anxiety disorder  Total Time spent with patient: 25 minutes  Past Psychiatric History: See admission H&P  Past Medical History:  Past Medical History:  Diagnosis Date   Anemia    Arthritis    pains in knee   Bipolar disorder ([place])    per Dr D.Patterson's office note 07/08/2007   Depression    [facility]   Diabetes mellitus    GESTATIONAL-not now   GERD (gastroesophageal reflux disease)    Hypertension    Morbid obesity ([place])    Polyp of colon    Sleep apnea    Wears  CPAP    Past Surgical History:  Procedure Laterality Date   ABDOMINAL HYSTERECTOMY  03/21/2010   TAH   BREATH TEK H PYLORI  08/05/2012   Procedure: BREATH TEK H PYLORI;  Surgeon: Pedro Earls, MD;  Location: [facility];  Service: General;  Laterality: N/A;  [place]  01/26/1999, 09/30/2002   X2.Marland Kitchen WITH BTSP   CHOLECYSTECTOMY N/A 08/27/2018   Procedure: LAPAROSCOPIC CHOLECYSTECTOMY WITH INTRAOPERATIVE CHOLANGIOGRAM;  Surgeon: Excell Seltzer, MD;  Location: [facility];  Service: General;  Laterality: N/A;   ENDOMETRIAL ABLATION  08/25/2009   HER OPTION    Family History:  Family History  Problem Relation Age of Onset   Hypertension Father    Obesity Father    Cancer Maternal Grandfather        colon   Sleep apnea Mother    Heart disease Paternal Grandfather    Colon cancer Neg Hx    Family Psychiatric  History: See admission H&P.  Patient's father had a history of depression and committed suicide approximately 2 years ago. Social History:  Social History   Substance and Sexual Activity  Alcohol Use Yes   Alcohol/week: 1.0 standard drink   Types: 1 Glasses of wine per week   Comment: occasional glass of wine once per week     Social History   Substance and Sexual Activity  Drug Use No    Social History   Socioeconomic History   Marital status: Married    Spouse name: Not on file   Number of children: Not on file   Years of education: Not on file   Highest education level: Not on file  Occupational History   Not on file  Tobacco Use   Smoking status: Former    Pack years: 0.00    Types: Cigarettes    Quit date: 05/03/1979    Years since quitting: 42.0   Smokeless tobacco: Never  Vaping Use   Vaping Use: Never used  Substance and Sexual Activity   Alcohol use: Yes    Alcohol/week: 1.0 standard drink    Types: 1 Glasses of wine per week    Comment: occasional glass of wine once per week   Drug use: No   Sexual activity: Yes    Comment: TAH   Other Topics Concern   Not on file  Social History Narrative   Not on file   Social Determinants of Health   Financial Resource Strain: Not on file  Food Insecurity: Not on file  Transportation Needs: Not on file  Physical Activity: Not on file  Stress: Not on file  Social Connections: Not on file   Additional Social History:                         Sleep: Fair   Appetite: Good  Current Medications: Current Facility-Administered Medications  Medication Dose Route Frequency Provider Last Rate Last Admin  acetaminophen (TYLENOL) tablet 650 mg  650 mg Oral Q6H PRN Prescilla Sours, PA-C   650 mg at 05/05/21 1420   ALPRAZolam (XANAX) tablet 0.5 mg  0.5 mg Oral TID PRN Ethelene Hal, NP   0.5 mg at 05/12/21 1509   alum & mag hydroxide-simeth (MAALOX/MYLANTA) 200-200-20 MG/5ML suspension 30 mL  30 mL Oral Q4H PRN [name] W, PA-C       buPROPion (WELLBUTRIN XL) 24 hr tablet 150 mg  150 mg Oral Daily Arthor Captain, MD   150 mg at 05/12/21 0800   feeding supplement (ENSURE ENLIVE / ENSURE PLUS) liquid 237 mL  237 mL Oral BID BM [name] W, PA-C   237 mL at 05/12/21 1004   FLUoxetine (PROZAC) capsule 40 mg  40 mg Oral Daily Arthor Captain, MD   40 mg at 05/12/21 0800   lisinopril (ZESTRIL) tablet 10 mg  10 mg Oral Daily [name] W, PA-C   10 mg at 05/12/21 1202   magnesium hydroxide (MILK OF MAGNESIA) suspension 30 mL  30 mL Oral Daily PRN Prescilla Sours, PA-C       multivitamin with minerals tablet 1 tablet  1 tablet Oral Daily Arthor Captain, MD   1 tablet at 05/12/21 0800   pantoprazole (PROTONIX) EC tablet 40 mg  40 mg Oral QHS [name] W, PA-C   40 mg at 05/11/21 2135   ramelteon (ROZEREM) tablet 8 mg  8 mg Oral QHS Arthor Captain, MD   8 mg at 05/11/21 2212   thiamine tablet 100 mg  100 mg Oral Daily Arthor Captain, MD   100 mg at 05/12/21 0800   traZODone (DESYREL) tablet 100 mg  100 mg Oral QHS Arthor Captain, MD   100 mg at 05/11/21 2211    traZODone (DESYREL) tablet 50 mg  50 mg Oral QHS PRN Arthor Captain, MD   50 mg at 05/11/21 2356   verapamil (CALAN-SR) CR tablet 180 mg  180 mg Oral QHS Prescilla Sours, PA-C   180 mg at 05/11/21 2135   zolpidem (AMBIEN) tablet 5 mg  5 mg Oral QHS Arthor Captain, MD   5 mg at 05/11/21 2211    Lab Results: No results found for this or any previous visit (from the past 14 hour(s)).  Blood Alcohol level:  No results found for: [facility]  Metabolic Disorder Labs: Lab Results  Component Value Date   HGBA1C 6.0 (H) 05/04/2021   MPG 126 05/04/2021   MPG 131 08/25/2018   No results found for: PROLACTIN Lab Results  Component Value Date   CHOL 118 05/04/2021   TRIG 109 05/04/2021   HDL 39 (L) 05/04/2021   CHOLHDL 3.0 05/04/2021   VLDL 22 05/04/2021   LDLCALC 57 05/04/2021    Physical Findings: AIMS:  , ,  ,  ,    CIWA:  CIWA-Ar Total: 0 COWS:     Musculoskeletal: Strength & Muscle Tone: within normal limits Gait & Station: normal Patient leans: N/A  Psychiatric Specialty Exam:  Presentation  General Appearance: Appropriate for Environment; Neat  Eye Contact:Good  Speech:Clear and Coherent; Normal Rate  Speech Volume:Normal  Handedness:Left   Mood and Affect  Mood:Anxious; Depressed ("Okay; less anxious less depressed.")  Affect:Appropriate; Full Range (Some appropriate brightening)   Thought Process  Thought Processes:Coherent; Goal Directed  Descriptions of Associations:Intact  Orientation:Full (Time, Place and Person)  Thought Content:Logical  History of Schizophrenia/Schizoaffective disorder:No  Duration of Psychotic Symptoms:N/A Hallucinations:Hallucinations: None  Ideas of Reference:None  Suicidal Thoughts:Suicidal Thoughts: No  Homicidal Thoughts:Homicidal Thoughts: No   Sensorium  Memory:Immediate Good; Remote Good; Recent Good  Judgment:Fair  Insight:Fair   Executive Functions  Concentration:Good  Attention  Span:Good  Recall:Good  Fund of Knowledge:Good  Language:Good   Psychomotor Activity  Psychomotor Activity:Psychomotor Activity: Normal   Assets  Assets:Communication Skills; Desire for Improvement; Financial Resources/Insurance; Housing; Social Support; Vocational/Educational   Sleep  Sleep:Sleep: Fair Number of Hours of Sleep: 4    Physical Exam: Physical Exam Vitals and nursing note reviewed.  Constitutional:      General: She is not in acute distress.    Appearance: Normal appearance.  HENT:     Head: Normocephalic and atraumatic.  Pulmonary:     Effort: Pulmonary effort is normal.  Neurological:     General: No focal deficit present.     Mental Status: She is alert and oriented to person, place, and time.   Review of Systems  Constitutional:  Negative for chills, diaphoresis and fever.  Respiratory:  Negative for cough and shortness of breath.   Cardiovascular:  Negative for chest pain and palpitations.  Gastrointestinal:  Negative for constipation, diarrhea, nausea and vomiting.  Musculoskeletal: Negative.   Skin: Negative.   Neurological: Negative.   Psychiatric/Behavioral:  Positive for depression. Negative for hallucinations and suicidal ideas. The patient is nervous/anxious and has insomnia.   All other systems reviewed and are negative.  Blood pressure 117/86, pulse (!) 58, temperature 98 F (36.7 C), temperature source Oral, resp. rate 20, height '5\' 4"'  (1.626 m), weight 101.6 kg, SpO2 99 %. Body mass index is 38.45 kg/m.   Treatment Plan Summary: Daily contact with patient to assess and evaluate symptoms and progress in treatment and Medication management  Continue on IVC   Continue every 15-minute observation status  Encourage participation in group therapy and therapeutic milieu.    Depression -Continue Prozac 40 mg daily  -Continue Wellbutrin XL 150 mg daily  Anxiety -Continue alprazolam 0.5 mg 3 times daily PRN -Continue Vistaril 25  mg 3 times daily as needed  Insomnia  -Continue Rozerem 8 mg at bedtime -Continue standing dose trazodone 100 mg at bedtime  -Continue Ambien 5 mg at bedtime -Continue trazodone 50 mg at bedtime PRN given at least 1 hour after standing dose trazodone if needed for sleep  Hypertension -Continue verapamil CR 180 mg nightly -Continue lisinopril 10 mg daily  GERD -Continue Protonix 40 mg daily  Social work is assisting with PHP referral and with outpatient therapy referral.    Social work is in daily contact with [facility] about possible direct transfer to [facility] inpatient for inpatient ECT.  There currently is a waiting list for ECT at [facility].  Husband reports patient has an appointment on 05/17/2021 in the [facility] outpatient ECT clinic for an intake and on 05/18/2021 with her outpatient psychiatrist at [facility] for med management.  Discharge planning in progress.  Anticipate probable discharge early next week   Arthor Captain, MD 05/12/2021, 5:02 PM

## 2021-05-12 NOTE — BHH Group Notes (Signed)
[place] Group Notes:  (Nursing/MHT/Case Management/Adjunct)  Date:  05/12/2021  Time:  11:32 AM  Type of Therapy:   Goals group  Participation Level:  Active  Participation Quality:  Appropriate, Attentive, and Sharing  Affect:  Appropriate  Cognitive:  Alert and Appropriate  Insight:  Improving  Engagement in Group:  Engaged  Modes of Intervention:  Discussion and Education  Summary of Progress/Problems:  Jackelyn Poling reported her goal for today is "continue to stay positive, go to groups and socialize."  She slept fair.   Barbette Or Slayde Brault 05/12/2021, 11:32 AM

## 2021-05-12 NOTE — Progress Notes (Addendum)
Pt received PRN Xanax at 1509 for complain of anxiety 8/10. Presents guarded, minimal / cautious with flat affect, depressed mood with slow and steady gait on interactions. Denies SI, HI, AVH and pain when assessed. Reports she slept fairly, with good appetite, normal and good concentration level. Visible in dayroom majority of this shift, attended all scheduled groups and was engaged without issues. Rates her depression 3/10, hopelessness 0/10 and anxiety 2/10 on self inventory sheet earlier this shift. Goal this shift is to "Continue to go to groups & activities and think positively". Pt is compliant with medications when offered. Denies adverse drug reactions when assessed. Tolerates all medications and meals well without discomfort thus far. Emotional support and encouragement offered to pt. All medications given as ordered with verbal education and effects monitored. Q 15 minutes safety checks maintained without incident. Pt remains safe on and off unit without outburst or self harm gestures. Reports decrease anxiety level 2/10 when reassessed post xanax at 1600. t

## 2021-05-12 NOTE — Progress Notes (Signed)
   05/12/21 0100  Psych Admission Type (Psych Patients Only)  Admission Status Involuntary  Psychosocial Assessment  Patient Complaints Anxiety;Depression;Hopelessness  Eye Contact Fair  Facial Expression Anxious  Affect Anxious;Preoccupied  Speech Logical/coherent;Soft  Interaction Assertive  Motor Activity Slow  Appearance/Hygiene In scrubs  Behavior Characteristics Anxious  Mood Depressed;Anxious  Thought Process  Coherency WDL  Content Blaming self  Delusions None reported or observed  Perception WDL  Hallucination None reported or observed  Judgment Poor  Confusion None  Danger to Self  Current suicidal ideation? Denies  Self-Injurious Behavior No self-injurious ideation or behavior indicators observed or expressed   Agreement Not to Harm Self Yes  Description of Agreement Verbal contract  Danger to Others  Danger to Others None reported or observed

## 2021-05-12 NOTE — Progress Notes (Signed)
Recreation Therapy Notes  Date:  6.17.22 Time: 0930 Location: [address] Dayroom  Group Topic: Stress Management  Goal Area(s) Addresses:  Patient will identify positive stress management techniques. Patient will identify benefits of using stress management post d/c.  Behavioral Response: Attentive  Intervention: Stress Management  Activity :  Meditation.  LRT played on meditation that focused on looking at each day as a new opportunity to do something new and be productive.  Education:  Stress Management, Discharge Planning.   Education Outcome: Acknowledges Education  Clinical Observations/Feedback:  Pt attended and participated.  Pt had no questions and expressed no concerns.    Victorino Sparrow, LRT/CTRS         Ria Comment, Atalie Oros A 05/12/2021 11:18 AM

## 2021-05-12 NOTE — Progress Notes (Signed)
Patient attended the A.A. group and spoke of her plans following discharge.

## 2021-05-12 NOTE — BHH Group Notes (Signed)
[place] Group Notes:  (Nursing/MHT/Case Management/Adjunct)   Date:  05/12/2021  Time:  12:58 PM   Type of Therapy:  Group Therapy   Participation Level:  Active   Participation Quality:  Appropriate, Attentive, and Sharing   Affect:  Anxious   Cognitive:  Alert and Appropriate   Insight:  Improving   Engagement in Group:  Engaged and Supportive   Modes of Intervention:  Activity and Rapport Building   Summary of Progress/Problems:  Alexis Wall was able to participate with the group activity.  She shared two true things about herself and one fact that is not true.  The group was able to pick what was the untrue statements.   Alexis Wall 05/12/2021, 12:58 PM

## 2021-05-12 NOTE — Plan of Care (Signed)
  Problem: Education: Goal: Utilization of techniques to improve thought processes will improve Outcome: Progressing Goal: Knowledge of the prescribed therapeutic regimen will improve Outcome: Progressing   Problem: Coping: Goal: Coping ability will improve Outcome: Progressing

## 2021-05-13 NOTE — Progress Notes (Signed)
[place] Group Notes:  (Nursing/MHT/Case Management/Adjunct)  Date:  05/13/2021  Time:  2015  Type of Therapy:   wrap up group  Participation Level:  Active  Participation Quality:  Appropriate, Attentive, Sharing, and Supportive  Affect:  Appropriate  Cognitive:  Appropriate  Insight:  Improving  Engagement in Group:  Engaged  Modes of Intervention:  Clarification, Education, and Support  Summary of Progress/Problems: Positive thinking and self-care were discussed.   Shellia Cleverly 05/13/2021, 8:57 PM

## 2021-05-13 NOTE — BHH Group Notes (Signed)
[place] Group Notes:  (Nursing/MHT/Case Management/Adjunct)  Date:  05/13/2021  Time:  9:59 AM  Type of Therapy:   Goals group  Participation Level:  Active  Participation Quality:  Appropriate, Attentive, and Sharing  Affect:  Depressed  Cognitive:  Alert and Appropriate  Insight:  Appropriate and Good  Engagement in Group:  Engaged  Modes of Intervention:  Discussion and Education  Summary of Progress/Problems:  Alexis Wall stated her goal for today is "continue to look for positives things.  She reported sleep was fair.  One positive thing about herself is "I love to help others."  Windell Moment 05/13/2021, 9:59 AM

## 2021-05-13 NOTE — BHH Group Notes (Signed)
  B[facility] LCSW Group Therapy Note  Date/Time:  05/13/2021 10-11am  Type of Therapy and Topic:  Group Therapy:  Feelings About Hospitalization  Participation Level:  Active   Description of Group This process group involved patients discussing their feelings related to being hospitalized, as well as the benefits they see to being in the hospital.  These feelings and benefits were itemized.  The group then brainstormed specific ways in which they could seek those same benefits when they discharge and return home.  Therapeutic Goals Patient will identify and describe positive and negative feelings related to hospitalization Patient will verbalize benefits of hospitalization to themselves personally Patients will brainstorm together ways they can obtain similar benefits in the outpatient setting, identify barriers to wellness and possible solutions  Summary of Patient Progress:  The patient expressed her primary feelings about being hospitalized are that this has been the "right place for me" and that it has been helpful.  One of the main components she would like to find also on the outside is peer support so she will not feel alone in her mental illnesss.  Therapeutic Modalities Cognitive Behavioral Therapy Motivational Interviewing    Selmer Dominion, LCSW 05/13/2021, 1:43 PM

## 2021-05-13 NOTE — Progress Notes (Signed)
Pt complained of insomnia, Ambien 5 mg, Trazodone 100 mg and scheduled medication given before bed, but pt still could not sleep and wanted more sleep medication, xanax  0.5 mg and trazodone 50 mg given. Pt encouraged to allow the medication time to work before asking for more medication. Will continue to monitor.

## 2021-05-13 NOTE — Progress Notes (Signed)
[facility] MD Progress Note  05/13/2021 2:02 PM Alexis Wall  MRN:  [id number]  Subjective: Alexis Wall reports, "I'm doing well, but not 100%. I'm learning to cope with this because it was due to depression that made me take a whole of bottle of pills to harm myself. I'm scheduled for discharge on Monday. I have a little anxiety about getting discharged, but, I look forward to the ECT discussion appointment with a doctor at the [facility] medical center to see if it will be an option for me again. I had tried this ECT in 2008 for my depression & it was a tremendous help. I have had depression for a long time. It started off as an anxiety, then progressed to this.  The Wellbutrin that the doctor added to my regimen is helping quite well, no side effects. Sleep has always been an issue for me. Today, my depression is at #3 & anxiety #5. I'm not feeling like hurting myself or anyone else. No voices or seeing things of course. I have been going to all groups so far, learning coping skills.  Reason for admission:  Alexis Wall is a 55 year old female with a history of major depressive disorder and anxiety who presented to [facility] ED on 05/04/21 following an overdose on Prozac, Ambien and Xanax in a suicide attempt.  There are conflicting numbers of tablets documented as having been taken in the overdose in the EMR.  The patient reported on assessment in the ED that she planned out the attempt and wrote suicide notes to family members.  She was transferred to [facility] for further treatment of depression.  (Per previous notes): Objective: Medical record reviewed.  Patient's case discussed in detail with members of the treatment team.  I met with and evaluated the patient on the unit today for follow-up.  Patient presents as mildly anxious and depressed but much improved from admission with brighter affect.  She reports that her mood is "okay, less depressed" and less anxious.  She denies suicidal ideation.  Patient reports sleeping  better last night.  Appetite is okay.  Reports she is able to get interest or enjoyment out of some interactions on the unit or some groups.  She is eager to go home.  Patient states that her husband came back from out of town last night and is available by phone today and request that I give him a call.  She denies any problems with her medications or physical problems.  The patient slept 4 hours last night.  She took PRN trazodone last night for sleep and took PRN Xanax yesterday at 1 PM and last night at around midnight.  She taken 1 dose of PRN Xanax today thus far at 3 PM.  I made direct phone contact today with patient's husband at patient's request and with her permission.  I spoke with Mr. Loretta Doutt [phone]) for approximately 20 to 25 minutes this morning.  We discussed patient's diagnosis, current symptoms, current treatment and medication, plan for treatment after discharge, anticipated outcomes from treatment and concerns about her safety after discharge from the hospital.  Mr. Cassel states that he will need to move furniture out of the patient's deceased father's home this weekend and he expressed concern that he will not be able to be is vigilant in observing patient over the weekend if she is discharged.  Mr. Schnepf also expressed concern that he feels patient is likely to be upset if she goes to her deceased father's home.  He would feel more comfortable if patient is discharged early next week.  Mr. Bober confirms that patient has an appointment on Wednesday of next week with outpatient ECT clinic at [facility] and on Thursday of next week with her psychiatrist at [facility].  He states that he plans to secure her medications and secure any weapons in the home in preparation for her discharge.  We discussed Gustavo's improvement with regard to her anxiety and depression as well as her risk factors for future suicide attempt.  Mr. Colquitt was provided an opportunity to ask questions and verbalize concerns.  His  questions were answered.  He stated understanding of the information discussed and expressed appreciation for the phone call and for the care the patient has been receiving in the hospital.  Principal Problem: MDD (major depressive disorder), recurrent severe, without psychosis ([place])  Diagnosis: Principal Problem:   MDD (major depressive disorder), recurrent severe, without psychosis ([place]) Active Problems:   Generalized anxiety disorder  Total Time spent with patient: 15 minutes  Past Psychiatric History: See admission H&P  Past Medical History:  Past Medical History:  Diagnosis Date   Anemia    Arthritis    pains in knee   Bipolar disorder ([place])    per Dr D.Patterson's office note 07/08/2007   Depression    [facility]   Diabetes mellitus    GESTATIONAL-not now   GERD (gastroesophageal reflux disease)    Hypertension    Morbid obesity ([place])    Polyp of colon    Sleep apnea    Wears CPAP    Past Surgical History:  Procedure Laterality Date   ABDOMINAL HYSTERECTOMY  03/21/2010   TAH   BREATH TEK H PYLORI  08/05/2012   Procedure: BREATH TEK H PYLORI;  Surgeon: Pedro Earls, MD;  Location: [facility];  Service: General;  Laterality: N/A;  [place]  01/26/1999, 09/30/2002   X2.Marland Kitchen WITH BTSP   CHOLECYSTECTOMY N/A 08/27/2018   Procedure: LAPAROSCOPIC CHOLECYSTECTOMY WITH INTRAOPERATIVE CHOLANGIOGRAM;  Surgeon: Excell Seltzer, MD;  Location: [facility];  Service: General;  Laterality: N/A;   ENDOMETRIAL ABLATION  08/25/2009   HER OPTION    Family History:  Family History  Problem Relation Age of Onset   Hypertension Father    Obesity Father    Cancer Maternal Grandfather        colon   Sleep apnea Mother    Heart disease Paternal Grandfather    Colon cancer Neg Hx    Family Psychiatric  History: See admission H&P.  Patient's father had a history of depression and committed suicide approximately 2 years ago.  Social History:  Social History    Substance and Sexual Activity  Alcohol Use Yes   Alcohol/week: 1.0 standard drink   Types: 1 Glasses of wine per week   Comment: occasional glass of wine once per week     Social History   Substance and Sexual Activity  Drug Use No    Social History   Socioeconomic History   Marital status: Married    Spouse name: Not on file   Number of children: Not on file   Years of education: Not on file   Highest education level: Not on file  Occupational History   Not on file  Tobacco Use   Smoking status: Former    Pack years: 0.00    Types: Cigarettes    Quit date: 05/03/1979    Years since quitting: 42.0   Smokeless tobacco:  Never  Vaping Use   Vaping Use: Never used  Substance and Sexual Activity   Alcohol use: Yes    Alcohol/week: 1.0 standard drink    Types: 1 Glasses of wine per week    Comment: occasional glass of wine once per week   Drug use: No   Sexual activity: Yes    Comment: TAH  Other Topics Concern   Not on file  Social History Narrative   Not on file   Social Determinants of Health   Financial Resource Strain: Not on file  Food Insecurity: Not on file  Transportation Needs: Not on file  Physical Activity: Not on file  Stress: Not on file  Social Connections: Not on file   Additional Social History:   Sleep: Fair   Appetite: Good  Current Medications: Current Facility-Administered Medications  Medication Dose Route Frequency Provider Last Rate Last Admin   acetaminophen (TYLENOL) tablet 650 mg  650 mg Oral Q6H PRN Prescilla Sours, PA-C   650 mg at 05/05/21 1420   ALPRAZolam (XANAX) tablet 0.5 mg  0.5 mg Oral TID PRN Ethelene Hal, NP   0.5 mg at 05/13/21 1146   alum & mag hydroxide-simeth (MAALOX/MYLANTA) 200-200-20 MG/5ML suspension 30 mL  30 mL Oral Q4H PRN [name] W, PA-C       buPROPion (WELLBUTRIN XL) 24 hr tablet 150 mg  150 mg Oral Daily Arthor Captain, MD   150 mg at 05/13/21 0818   feeding supplement (ENSURE ENLIVE /  ENSURE PLUS) liquid 237 mL  237 mL Oral BID BM [name] W, PA-C   237 mL at 05/12/21 1004   FLUoxetine (PROZAC) capsule 40 mg  40 mg Oral Daily Arthor Captain, MD   40 mg at 05/13/21 0817   lisinopril (ZESTRIL) tablet 10 mg  10 mg Oral Daily [name] W, PA-C   10 mg at 05/13/21 4097   magnesium hydroxide (MILK OF MAGNESIA) suspension 30 mL  30 mL Oral Daily PRN [name] W, PA-C       multivitamin with minerals tablet 1 tablet  1 tablet Oral Daily Arthor Captain, MD   1 tablet at 05/13/21 0817   pantoprazole (PROTONIX) EC tablet 40 mg  40 mg Oral QHS [name] W, PA-C   40 mg at 05/12/21 2112   ramelteon (ROZEREM) tablet 8 mg  8 mg Oral QHS Arthor Captain, MD   8 mg at 05/12/21 2112   thiamine tablet 100 mg  100 mg Oral Daily Arthor Captain, MD   100 mg at 05/13/21 0817   traZODone (DESYREL) tablet 100 mg  100 mg Oral QHS Arthor Captain, MD   100 mg at 05/12/21 2112   traZODone (DESYREL) tablet 50 mg  50 mg Oral QHS PRN Arthor Captain, MD   50 mg at 05/12/21 2347   verapamil (CALAN-SR) CR tablet 180 mg  180 mg Oral QHS [name] W, PA-C   180 mg at 05/12/21 2112   zolpidem (AMBIEN) tablet 5 mg  5 mg Oral QHS Arthor Captain, MD   5 mg at 05/12/21 2112   Lab Results: No results found for this or any previous visit (from the past 48 hour(s)).  Blood Alcohol level:  No results found for: [facility]  Metabolic Disorder Labs: Lab Results  Component Value Date   HGBA1C 6.0 (H) 05/04/2021   MPG 126 05/04/2021   MPG 131 08/25/2018   No results found for:  PROLACTIN Lab Results  Component Value Date   CHOL 118 05/04/2021   TRIG 109 05/04/2021   HDL 39 (L) 05/04/2021   CHOLHDL 3.0 05/04/2021   VLDL 22 05/04/2021   LDLCALC 57 05/04/2021   Physical Findings: AIMS: Facial and Oral Movements Muscles of Facial Expression: None, normal Lips and Perioral Area: None, normal Jaw: None, normal Tongue: None, normal,Extremity Movements Upper (arms, wrists, hands, fingers): None,  normal Lower (legs, knees, ankles, toes): None, normal, Trunk Movements Neck, shoulders, hips: None, normal, Overall Severity Severity of abnormal movements (highest score from questions above): None, normal Incapacitation due to abnormal movements: None, normal Patient's awareness of abnormal movements (rate only patient's report): No Awareness, Dental Status Current problems with teeth and/or dentures?: No Does patient usually wear dentures?: No  CIWA:  CIWA-Ar Total: 1 COWS:     Musculoskeletal: Strength & Muscle Tone: within normal limits Gait & Station: normal Patient leans: N/A  Psychiatric Specialty Exam:  Presentation  General Appearance: Appropriate for Environment; Neat  Eye Contact:Good  Speech:Clear and Coherent; Normal Rate  Speech Volume:Normal  Handedness:Left  Mood and Affect  Mood:Anxious; Depressed ("Okay; less anxious less depressed.")  Affect:Appropriate; Full Range (Some appropriate brightening)  Thought Process  Thought Processes:Coherent; Goal Directed  Descriptions of Associations:Intact  Orientation:Full (Time, Place and Person)  Thought Content:Logical  History of Schizophrenia/Schizoaffective disorder:No  Duration of Psychotic Symptoms:N/A Hallucinations:Hallucinations: None  Ideas of Reference:None  Suicidal Thoughts:Suicidal Thoughts: No  Homicidal Thoughts:Homicidal Thoughts: No  Sensorium  Memory:Immediate Good; Remote Good; Recent Good  Judgment:Fair  Insight:Fair  Executive Functions  Concentration:Good  Attention Span:Good  [place] of Knowledge:Good  Language:Good  Psychomotor Activity  Psychomotor Activity:Psychomotor Activity: Normal  Assets  Assets:Communication Skills; Desire for Improvement; Financial Resources/Insurance; Housing; Social Support; Vocational/Educational  Sleep  Sleep:Sleep: Fair Number of Hours of Sleep: 4  Physical Exam: Physical Exam Vitals and nursing note reviewed.   Constitutional:      General: She is not in acute distress.    Appearance: Normal appearance.  HENT:     Head: Normocephalic and atraumatic.  Pulmonary:     Effort: Pulmonary effort is normal.  Neurological:     General: No focal deficit present.     Mental Status: She is alert and oriented to person, place, and time.   Review of Systems  Constitutional:  Negative for chills, diaphoresis and fever.  Respiratory:  Negative for cough and shortness of breath.   Cardiovascular:  Negative for chest pain and palpitations.  Gastrointestinal:  Negative for constipation, diarrhea, nausea and vomiting.  Musculoskeletal: Negative.   Skin: Negative.   Neurological: Negative.   Psychiatric/Behavioral:  Positive for depression. Negative for hallucinations and suicidal ideas. The patient is nervous/anxious and has insomnia.   All other systems reviewed and are negative.  Blood pressure 102/67, pulse 68, temperature 98.1 F (36.7 C), temperature source Oral, resp. rate 20, height '5\' 4"'  (1.626 m), weight 101.6 kg, SpO2 99 %. Body mass index is 38.45 kg/m.  Treatment Plan Summary: Daily contact with patient to assess and evaluate symptoms and progress in treatment and Medication management.  Continue inpatient hospitalization.  Will continue today 05/13/2021 plan as below except where it is noted.   Continue on IVC  Continue every 15-minute observation status  Encourage participation in group therapy and therapeutic milieu.    Depression -Continue Prozac 40 mg daily  -Continue Wellbutrin XL 150 mg daily  Anxiety -Continue alprazolam 0.5 mg 3 times daily PRN -Continue Vistaril 25 mg 3  times daily as needed  Insomnia  -Continue Rozerem 8 mg at bedtime -Continue standing dose trazodone 100 mg at bedtime  -Continue Ambien 5 mg at bedtime -Continue trazodone 50 mg at bedtime PRN given at least 1 hour after standing dose trazodone if needed for sleep  Hypertension -Continue verapamil CR  180 mg nightly -Continue lisinopril 10 mg daily  GERD -Continue Protonix 40 mg daily  Social work is assisting with PHP referral and with outpatient therapy referral.    Social work is in daily contact with [facility] about possible direct transfer to [facility] inpatient for inpatient ECT.  There currently is a waiting list for ECT at [facility].  Husband reports patient has an appointment on 05/17/2021 in the [facility] outpatient ECT clinic for an intake and on 05/18/2021 with her outpatient psychiatrist at [facility] for med management.  Discharge planning in progress.  Anticipate probable discharge early next week  Lindell Spar, NP, pmhnp, fnp-bc 05/13/2021, 2:02 PM

## 2021-05-14 NOTE — Progress Notes (Signed)
   05/14/21 2350  Psych Admission Type (Psych Patients Only)  Admission Status Involuntary  Psychosocial Assessment  Patient Complaints Anxiety;Insomnia  Eye Contact Fair  Facial Expression Anxious  Affect Appropriate to circumstance  Speech Logical/coherent  Interaction Assertive  Motor Activity Other (Comment) (WDL)  Appearance/Hygiene Unremarkable  Behavior Characteristics Appropriate to situation  Mood Pleasant  Thought Process  Coherency WDL  Content WDL  Delusions None reported or observed  Perception WDL  Hallucination None reported or observed  Judgment Poor  Confusion None  Danger to Self  Current suicidal ideation? Denies  Self-Injurious Behavior No self-injurious ideation or behavior indicators observed or expressed   Agreement Not to Harm Self Yes  Description of Agreement Verbal contract  Danger to Others  Danger to Others None reported or observed

## 2021-05-14 NOTE — Progress Notes (Signed)
[address] [facility] MD Progress Note  05/14/2021 3:23 PM Alexis Wall  MRN:  [id number]  Subjective: Alexis Wall reports, "I'm doing well, a little anxious about going home. Taking the medicines, no side effects. Sleep fairly. Today, my depression is at #2 & anxiety #3. I'm not feeling like hurting myself or anyone else. No voices or seeing things of course. I have been going to all groups so far, learning coping skills".  Reason for admission:  Alexis Wall is a 55 year old female with a history of major depressive disorder and anxiety who presented to [facility] ED on 05/04/21 following an overdose on Prozac, Ambien and Xanax in a suicide attempt.  There are conflicting numbers of tablets documented as having been taken in the overdose in the EMR.  The patient reported on assessment in the ED that she planned out the attempt and wrote suicide notes to family members.  She was transferred to [facility] for further treatment of depression.  (Per previous notes): Objective: Medical record reviewed.  Patient's case discussed in detail with members of the treatment team.  I met with and evaluated the patient on the unit today for follow-up.  Patient presents as mildly anxious and depressed but much improved from admission with brighter affect.  She reports that her mood is "okay, less depressed" and less anxious.  She denies suicidal ideation.  Patient reports sleeping better last night.  Appetite is okay.  Reports she is able to get interest or enjoyment out of some interactions on the unit or some groups.  She is eager to go home.  Patient states that her husband came back from out of town last night and is available by phone today and request that I give him a call.  She denies any problems with her medications or physical problems.  The patient slept 4 hours last night.  She took PRN trazodone last night for sleep and took PRN Xanax yesterday at 1 PM and last night at around midnight.  She taken 1 dose of PRN Xanax today thus far at 3  PM.  I made direct phone contact today with patient's husband at patient's request and with her permission.  I spoke with Mr. Seona Clemenson [phone]) for approximately 20 to 25 minutes this morning.  We discussed patient's diagnosis, current symptoms, current treatment and medication, plan for treatment after discharge, anticipated outcomes from treatment and concerns about her safety after discharge from the hospital.  Mr. Bhattacharyya states that he will need to move furniture out of the patient's deceased father's home this weekend and he expressed concern that he will not be able to be is vigilant in observing patient over the weekend if she is discharged.  Mr. Doleman also expressed concern that he feels patient is likely to be upset if she goes to her deceased father's home.  He would feel more comfortable if patient is discharged early next week.  Mr. Ormand confirms that patient has an appointment on Wednesday of next week with outpatient ECT clinic at [facility] and on Thursday of next week with her psychiatrist at [facility].  He states that he plans to secure her medications and secure any weapons in the home in preparation for her discharge.  We discussed Alexis Wall's improvement with regard to her anxiety and depression as well as her risk factors for future suicide attempt.  Mr. Feliz was provided an opportunity to ask questions and verbalize concerns.  His questions were answered.  He stated understanding of the information discussed and  expressed appreciation for the phone call and for the care the patient has been receiving in the hospital.  Principal Problem: MDD (major depressive disorder), recurrent severe, without psychosis ([place])  Diagnosis: Principal Problem:   MDD (major depressive disorder), recurrent severe, without psychosis ([place]) Active Problems:   Generalized anxiety disorder  Total Time spent with patient: 15 minutes  Past Psychiatric History: See admission H&P  Past Medical History:  Past Medical  History:  Diagnosis Date   Anemia    Arthritis    pains in knee   Bipolar disorder ([place])    per Dr D.Patterson's office note 07/08/2007   Depression    [facility]   Diabetes mellitus    GESTATIONAL-not now   GERD (gastroesophageal reflux disease)    Hypertension    Morbid obesity ([place])    Polyp of colon    Sleep apnea    Wears CPAP    Past Surgical History:  Procedure Laterality Date   ABDOMINAL HYSTERECTOMY  03/21/2010   TAH   BREATH TEK H PYLORI  08/05/2012   Procedure: BREATH TEK H PYLORI;  Surgeon: Pedro Earls, MD;  Location: [facility];  Service: General;  Laterality: N/A;  [place]  01/26/1999, 09/30/2002   X2.Marland Kitchen WITH BTSP   CHOLECYSTECTOMY N/A 08/27/2018   Procedure: LAPAROSCOPIC CHOLECYSTECTOMY WITH INTRAOPERATIVE CHOLANGIOGRAM;  Surgeon: Excell Seltzer, MD;  Location: [facility];  Service: General;  Laterality: N/A;   ENDOMETRIAL ABLATION  08/25/2009   HER OPTION    Family History:  Family History  Problem Relation Age of Onset   Hypertension Father    Obesity Father    Cancer Maternal Grandfather        colon   Sleep apnea Mother    Heart disease Paternal Grandfather    Colon cancer Neg Hx    Family Psychiatric  History: See admission H&P.  Patient's father had a history of depression and committed suicide approximately 2 years ago.  Social History:  Social History   Substance and Sexual Activity  Alcohol Use Yes   Alcohol/week: 1.0 standard drink   Types: 1 Glasses of wine per week   Comment: occasional glass of wine once per week     Social History   Substance and Sexual Activity  Drug Use No    Social History   Socioeconomic History   Marital status: Married    Spouse name: Not on file   Number of children: Not on file   Years of education: Not on file   Highest education level: Not on file  Occupational History   Not on file  Tobacco Use   Smoking status: Former    Pack years: 0.00    Types: Cigarettes    Quit  date: 05/03/1979    Years since quitting: 42.0   Smokeless tobacco: Never  Vaping Use   Vaping Use: Never used  Substance and Sexual Activity   Alcohol use: Yes    Alcohol/week: 1.0 standard drink    Types: 1 Glasses of wine per week    Comment: occasional glass of wine once per week   Drug use: No   Sexual activity: Yes    Comment: TAH  Other Topics Concern   Not on file  Social History Narrative   Not on file   Social Determinants of Health   Financial Resource Strain: Not on file  Food Insecurity: Not on file  Transportation Needs: Not on file  Physical Activity: Not on file  Stress: Not on file  Social Connections: Not on file   Additional Social History:   Sleep: Fair   Appetite: Good  Current Medications: Current Facility-Administered Medications  Medication Dose Route Frequency Provider Last Rate Last Admin   acetaminophen (TYLENOL) tablet 650 mg  650 mg Oral Q6H PRN Prescilla Sours, PA-C   650 mg at 05/05/21 1420   ALPRAZolam (XANAX) tablet 0.5 mg  0.5 mg Oral TID PRN Ethelene Hal, NP   0.5 mg at 05/14/21 1152   alum & mag hydroxide-simeth (MAALOX/MYLANTA) 200-200-20 MG/5ML suspension 30 mL  30 mL Oral Q4H PRN [name] W, PA-C       buPROPion (WELLBUTRIN XL) 24 hr tablet 150 mg  150 mg Oral Daily Arthor Captain, MD   150 mg at 05/14/21 0753   feeding supplement (ENSURE ENLIVE / ENSURE PLUS) liquid 237 mL  237 mL Oral BID BM [name] W, PA-C   237 mL at 05/14/21 1009   FLUoxetine (PROZAC) capsule 40 mg  40 mg Oral Daily Arthor Captain, MD   40 mg at 05/14/21 0754   lisinopril (ZESTRIL) tablet 10 mg  10 mg Oral Daily [name] W, PA-C   10 mg at 05/14/21 1009   magnesium hydroxide (MILK OF MAGNESIA) suspension 30 mL  30 mL Oral Daily PRN Prescilla Sours, PA-C       multivitamin with minerals tablet 1 tablet  1 tablet Oral Daily Arthor Captain, MD   1 tablet at 05/14/21 0753   pantoprazole (PROTONIX) EC tablet 40 mg  40 mg Oral QHS [name] W,  PA-C   40 mg at 05/13/21 2106   ramelteon (ROZEREM) tablet 8 mg  8 mg Oral QHS Arthor Captain, MD   8 mg at 05/13/21 2215   thiamine tablet 100 mg  100 mg Oral Daily Arthor Captain, MD   100 mg at 05/14/21 0753   traZODone (DESYREL) tablet 100 mg  100 mg Oral QHS Arthor Captain, MD   100 mg at 05/13/21 2216   traZODone (DESYREL) tablet 50 mg  50 mg Oral QHS PRN Arthor Captain, MD   50 mg at 05/13/21 2348   verapamil (CALAN-SR) CR tablet 180 mg  180 mg Oral QHS Prescilla Sours, PA-C   180 mg at 05/13/21 2106   zolpidem (AMBIEN) tablet 5 mg  5 mg Oral QHS Arthor Captain, MD   5 mg at 05/13/21 2216   Lab Results: No results found for this or any previous visit (from the past 70 hour(s)).  Blood Alcohol level:  No results found for: [facility]  Metabolic Disorder Labs: Lab Results  Component Value Date   HGBA1C 6.0 (H) 05/04/2021   MPG 126 05/04/2021   MPG 131 08/25/2018   No results found for: PROLACTIN Lab Results  Component Value Date   CHOL 118 05/04/2021   TRIG 109 05/04/2021   HDL 39 (L) 05/04/2021   CHOLHDL 3.0 05/04/2021   VLDL 22 05/04/2021   LDLCALC 57 05/04/2021   Physical Findings: AIMS: Facial and Oral Movements Muscles of Facial Expression: None, normal Lips and Perioral Area: None, normal Jaw: None, normal Tongue: None, normal,Extremity Movements Upper (arms, wrists, hands, fingers): None, normal Lower (legs, knees, ankles, toes): None, normal, Trunk Movements Neck, shoulders, hips: None, normal, Overall Severity Severity of abnormal movements (highest score from questions above): None, normal Incapacitation due to abnormal movements: None, normal Patient's awareness of abnormal movements (rate only  patient's report): No Awareness, Dental Status Current problems with teeth and/or dentures?: No Does patient usually wear dentures?: No  CIWA:  CIWA-Ar Total: 0 COWS:     Musculoskeletal: Strength & Muscle Tone: within normal limits Gait & Station: normal Patient  leans: N/A  Psychiatric Specialty Exam:  Presentation  General Appearance: Appropriate for Environment; Neat  Eye Contact:Good  Speech:Clear and Coherent; Normal Rate  Speech Volume:Normal  Handedness:Left  Mood and Affect  Mood:Anxious; Depressed ("Okay; less anxious less depressed.")  Affect:Appropriate; Full Range (Some appropriate brightening)  Thought Process  Thought Processes:Coherent; Goal Directed  Descriptions of Associations:Intact  Orientation:Full (Time, Place and Person)  Thought Content:Logical  History of Schizophrenia/Schizoaffective disorder:No  Duration of Psychotic Symptoms:N/A Hallucinations:No data recorded  Ideas of Reference:None  Suicidal Thoughts:No data recorded  Homicidal Thoughts:No data recorded  Sensorium  Memory:Immediate Good; Remote Good; Recent Good  Judgment:Fair  Insight:Fair  Executive Functions  Concentration:Good  Attention Span:Good  [place] of Knowledge:Good  Language:Good  Psychomotor Activity  Psychomotor Activity:No data recorded  Assets  Assets:Communication Skills; Desire for Improvement; Financial Resources/Insurance; Housing; Social Support; Vocational/Educational  Sleep  Sleep:No data recorded  Physical Exam: Physical Exam Vitals and nursing note reviewed.  Constitutional:      General: She is not in acute distress.    Appearance: Normal appearance.  HENT:     Head: Normocephalic and atraumatic.  Pulmonary:     Effort: Pulmonary effort is normal.  Neurological:     General: No focal deficit present.     Mental Status: She is alert and oriented to person, place, and time.   Review of Systems  Constitutional:  Negative for chills, diaphoresis and fever.  Respiratory:  Negative for cough and shortness of breath.   Cardiovascular:  Negative for chest pain and palpitations.  Gastrointestinal:  Negative for constipation, diarrhea, nausea and vomiting.  Musculoskeletal: Negative.    Skin: Negative.   Neurological: Negative.   Psychiatric/Behavioral:  Positive for depression. Negative for hallucinations and suicidal ideas. The patient is nervous/anxious and has insomnia.   All other systems reviewed and are negative.  Blood pressure 136/82, pulse 67, temperature 98.1 F (36.7 C), temperature source Oral, resp. rate 16, height '5\' 4"'  (1.626 m), weight 101.6 kg, SpO2 96 %. Body mass index is 38.45 kg/m.  Treatment Plan Summary: Daily contact with patient to assess and evaluate symptoms and progress in treatment and Medication management.  Continue inpatient hospitalization.  Will continue today 05/14/2021 plan as below except where it is noted.   Continue on IVC  Continue every 15-minute observation status  Encourage participation in group therapy and therapeutic milieu.    Depression -Continue Prozac 40 mg daily  -Continue Wellbutrin XL 150 mg daily  Anxiety -Continue alprazolam 0.5 mg 3 times daily PRN -Continue Vistaril 25 mg 3 times daily as needed  Insomnia  -Continue Rozerem 8 mg at bedtime -Continue standing dose trazodone 100 mg at bedtime  -Continue Ambien 5 mg at bedtime -Continue trazodone 50 mg at bedtime PRN given at least 1 hour after standing dose trazodone if needed for sleep  Hypertension -Continue verapamil CR 180 mg nightly -Continue lisinopril 10 mg daily  GERD -Continue Protonix 40 mg daily  Social work is assisting with PHP referral and with outpatient therapy referral.    Social work is in daily contact with [facility] about possible direct transfer to [facility] inpatient for inpatient ECT.  There currently is a waiting list for ECT at [facility].  Husband reports patient  has an appointment on 05/17/2021 in the [facility] outpatient ECT clinic for an intake and on 05/18/2021 with her outpatient psychiatrist at [facility] for med management.  Discharge planning in progress.  Anticipate probable discharge early next week  Lindell Spar, NP, pmhnp,  fnp-bc 05/14/2021, 3:23 PM

## 2021-05-14 NOTE — BHH Group Notes (Signed)
[place] LCSW Group Therapy Note  05/14/2021  10:00-11:00AM  Type of Therapy and Topic:  Group Therapy:  Acknowledging and Resolving Issues with Fathers  Participation Level:  Active   Description of Group:   Patients in this group were asked to briefly describe their experience with the father figure(s) in their lives, both in childhood and adulthood.  Different types of support provided by these individuals were identified.   Patients were then encouraged to determine whether their father figure was or is a healthy or unhealthy support.  The manner in which that early relationship has shaped patient's feelings and life decisions was pointed out and acknowledged.  Group members gave support to each other.  CSW led a discussion on how helpful it can be to resolve past issues, and how this can be done whether the father figure is now alive or already deceased.  An emphasis was placed on continuing to work with a therapist on these issues  when patients leave the hospital in order to be able to focus on the future instead of the past, to continue becoming healthier and happier.   Therapeutic Goals: 1)  discuss the possibility of father figure(s) being positive and/or negative in one's life, normalizing that some people never had positive experiences with "paternal" persons  2)  describe patient's specific example of father figure(s), allowing time to vent  3)  identify the patient's current need for resolution in the relationship with the aforementioned person  4)  elicit commitments to work on resolving feelings about father figure(s) in order to move forward in life and wellness   Summary of Patient Progress:  The patient expressed full comprehension of the concepts presented, and stated her relationship with her father was "great" until his death 2 years ago.  She did not mention the manner of his death being by suicide.  When she was 8yo her parents divorced so she and mother moved from [place] to  [place].  She would still go see father on holidays and summers, and she stated he would work overtime hours to be able to achieve that.  He was a Engineer, structural, suffered from anxiety and depression and had multiple hospitalizations during his life.  She stated that she tries to be present for her children as her father was for her.   Therapeutic Modalities:   Processing Brief Solution-Focused Therapy  Maretta Los

## 2021-05-14 NOTE — Progress Notes (Signed)
   05/14/21 0044  Psych Admission Type (Psych Patients Only)  Admission Status Involuntary  Psychosocial Assessment  Patient Complaints Anxiety  Eye Contact Fair  Facial Expression Anxious  Affect Appropriate to circumstance  Speech Logical/coherent  Interaction Assertive  Motor Activity Other (Comment) (WDL)  Appearance/Hygiene Unremarkable  Behavior Characteristics Appropriate to situation  Mood Pleasant;Anxious  Thought Process  Coherency WDL  Content WDL  Delusions None reported or observed  Perception WDL  Hallucination None reported or observed  Judgment Poor  Confusion None  Danger to Self  Current suicidal ideation? Denies  Self-Injurious Behavior No self-injurious ideation or behavior indicators observed or expressed   Agreement Not to Harm Self Yes  Description of Agreement Verbal contract  Danger to Others  Danger to Others None reported or observed

## 2021-05-14 NOTE — Progress Notes (Signed)
   05/14/21 1900  Psych Admission Type (Psych Patients Only)  Admission Status Involuntary  Psychosocial Assessment  Patient Complaints Depression;Anxiety  Eye Contact Fair  Facial Expression Anxious  Affect Appropriate to circumstance  Speech Logical/coherent  Interaction Assertive  Motor Activity Other (Comment) (WDL)  Appearance/Hygiene Unremarkable  Behavior Characteristics Appropriate to situation  Mood Pleasant;Anxious  Thought Process  Coherency WDL  Content WDL  Delusions None reported or observed  Perception WDL  Hallucination None reported or observed  Judgment Poor  Confusion None  Danger to Self  Current suicidal ideation? Denies  Self-Injurious Behavior No self-injurious ideation or behavior indicators observed or expressed   Agreement Not to Harm Self Yes  Description of Agreement Verbal contract  Danger to Others  Danger to Others None reported or observed

## 2021-05-14 NOTE — BHH Group Notes (Signed)
Adult Psychoeducational Group Note  Date:  05/14/2021 Time:  1:19 PM  Group Topic/Focus:  Goals Group:   The focus of this group is to help patients establish daily goals to achieve during treatment and discuss how the patient can incorporate goal setting into their daily lives to aide in recovery.  Participation Level:  Active  Participation Quality:  Appropriate  Affect:  Appropriate  Cognitive:  Appropriate  Insight: Appropriate  Engagement in Group:  Engaged  Modes of Intervention:  Discussion  Additional Comments:  Patient attended goals group and said that her goal for today is to think positively.   Frieda Arnall W Airam Heidecker 4/32/7614, 1:19 PM

## 2021-05-14 NOTE — Progress Notes (Signed)
[place] Group Notes:  (Nursing/MHT/Case Management/Adjunct)  Date:  05/14/2021  Time:  2015 Type of Therapy:   wrap up group  Participation Level:  Active  Participation Quality:  Appropriate, Attentive, Sharing, and Supportive  Affect:  Appropriate  Cognitive:  Alert  Insight:  Improving  Engagement in Group:  Engaged  Modes of Intervention:  Clarification, Education, and Support  Summary of Progress/Problems: Positive thinking and self-care were discussed.   Shellia Cleverly 05/14/2021, 9:40 PM

## 2021-05-15 MED ORDER — VERAPAMIL HCL ER 180 MG PO TBCR: 180.0000 mg | EXTENDED_RELEASE_TABLET | Freq: Every day | ORAL | 0 refills | Status: AC

## 2021-05-15 MED ORDER — ZOLPIDEM TARTRATE 5 MG PO TABS: 10.0000 mg | ORAL_TABLET | Freq: Every day | ORAL | Status: DC

## 2021-05-15 MED ORDER — ZOLPIDEM TARTRATE 10 MG PO TABS: 10.0000 mg | ORAL_TABLET | Freq: Every day | ORAL | 0 refills | Status: DC

## 2021-05-15 MED ORDER — ALPRAZOLAM 0.5 MG PO TABS: 0.5000 mg | ORAL_TABLET | Freq: Three times a day (TID) | ORAL | 0 refills | Status: DC | PRN

## 2021-05-15 MED ORDER — LISINOPRIL 10 MG PO TABS: 10.0000 mg | ORAL_TABLET | Freq: Every day | ORAL | 0 refills | Status: DC

## 2021-05-15 MED ORDER — THIAMINE HCL 100 MG PO TABS: 100.0000 mg | ORAL_TABLET | Freq: Every day | ORAL | Status: AC

## 2021-05-15 MED ORDER — LISINOPRIL 10 MG PO TABS: 10.0000 mg | ORAL_TABLET | Freq: Every day | ORAL | 0 refills | Status: AC

## 2021-05-15 MED ORDER — TRAZODONE HCL 100 MG PO TABS: 100.0000 mg | ORAL_TABLET | Freq: Every day | ORAL | 0 refills | Status: AC

## 2021-05-15 MED ORDER — RAMELTEON 8 MG PO TABS: 8.0000 mg | ORAL_TABLET | Freq: Every day | ORAL | 0 refills | Status: AC

## 2021-05-15 MED ORDER — ZOLPIDEM TARTRATE 10 MG PO TABS: 10.0000 mg | ORAL_TABLET | Freq: Every day | ORAL | 0 refills | Status: AC

## 2021-05-15 MED ORDER — ALPRAZOLAM 0.5 MG PO TABS: 0.5000 mg | ORAL_TABLET | Freq: Three times a day (TID) | ORAL | 0 refills | Status: AC | PRN

## 2021-05-15 MED ORDER — BUPROPION HCL ER (XL) 150 MG PO TB24: 150.0000 mg | ORAL_TABLET | Freq: Every day | ORAL | 0 refills | Status: AC

## 2021-05-15 MED ORDER — FLUOXETINE HCL 40 MG PO CAPS: 40.0000 mg | ORAL_CAPSULE | Freq: Every day | ORAL | 0 refills | Status: AC

## 2021-05-15 NOTE — Progress Notes (Signed)
D: Pt A & O X 4. Denies SI, HI, AVH and pain at this time. D/C home as ordered. Picked up in lobby by her husband.  A: D/C instructions reviewed with pt including electronic prescriptions and follow up appointments; compliance encouraged. All belongings from locker 08 given to pt at time of departure. Scheduled and PRN medications given with verbal education and effects monitored. Safety checks maintained without incident till time of d/c.  R: Pt receptive to care. Compliant with medications when offered. Denies adverse drug reactions when assessed. Pt verbalized understanding related to d/c instructions. Signed belonging sheet in agreement with items received from locker. Ambulatory with a steady gait. Appears to be in no physical distress at time of departure.

## 2021-05-15 NOTE — Tx Team (Signed)
Interdisciplinary Treatment and Diagnostic Plan Update  05/15/2021 Time of Session:  Alexis Wall MRN: [id number]  Principal Diagnosis: MDD (major depressive disorder), recurrent severe, without psychosis ([place])  Secondary Diagnoses: Principal Problem:   MDD (major depressive disorder), recurrent severe, without psychosis ([place]) Active Problems:   Generalized anxiety disorder   Current Medications:  Current Facility-Administered Medications  Medication Dose Route Frequency Provider Last Rate Last Admin   acetaminophen (TYLENOL) tablet 650 mg  650 mg Oral Q6H PRN Prescilla Sours, PA-C   650 mg at 05/05/21 1420   ALPRAZolam (XANAX) tablet 0.5 mg  0.5 mg Oral TID PRN Ethelene Hal, NP   0.5 mg at 05/15/21 0928   alum & mag hydroxide-simeth (MAALOX/MYLANTA) 200-200-20 MG/5ML suspension 30 mL  30 mL Oral Q4H PRN [name] W, PA-C       buPROPion (WELLBUTRIN XL) 24 hr tablet 150 mg  150 mg Oral Daily Arthor Captain, MD   150 mg at 05/15/21 0829   feeding supplement (ENSURE ENLIVE / ENSURE PLUS) liquid 237 mL  237 mL Oral BID BM [name] W, PA-C   237 mL at 05/15/21 0926   FLUoxetine (PROZAC) capsule 40 mg  40 mg Oral Daily Arthor Captain, MD   40 mg at 05/15/21 0828   lisinopril (ZESTRIL) tablet 10 mg  10 mg Oral Daily [name] W, PA-C   10 mg at 05/15/21 4008   magnesium hydroxide (MILK OF MAGNESIA) suspension 30 mL  30 mL Oral Daily PRN Prescilla Sours, PA-C       multivitamin with minerals tablet 1 tablet  1 tablet Oral Daily Arthor Captain, MD   1 tablet at 05/15/21 0829   pantoprazole (PROTONIX) EC tablet 40 mg  40 mg Oral QHS [name] W, PA-C   40 mg at 05/14/21 2113   ramelteon (ROZEREM) tablet 8 mg  8 mg Oral QHS Arthor Captain, MD   8 mg at 05/14/21 2206   thiamine tablet 100 mg  100 mg Oral Daily Arthor Captain, MD   100 mg at 05/15/21 6761   traZODone (DESYREL) tablet 100 mg  100 mg Oral QHS Arthor Captain, MD   100 mg at 05/14/21 2206   traZODone  (DESYREL) tablet 50 mg  50 mg Oral QHS PRN Arthor Captain, MD   50 mg at 05/14/21 2341   verapamil (CALAN-SR) CR tablet 180 mg  180 mg Oral QHS Prescilla Sours, PA-C   180 mg at 05/14/21 2113   zolpidem (AMBIEN) tablet 10 mg  10 mg Oral QHS Ethelene Hal, NP       PTA Medications: Medications Prior to Admission  Medication Sig Dispense Refill Last Dose   propranolol (INDERAL) 10 MG tablet Take 20 mg by mouth 3 (three) times daily. Patient reports that she takes this scheduled for anxiety.      ALPRAZolam (XANAX) 0.5 MG tablet Take 0.5 mg by mouth 3 (three) times daily.      clonazePAM (KLONOPIN) 1 MG tablet Take 1 mg by mouth 2 (two) times daily.      FLUoxetine (PROZAC) 20 MG capsule Take 60 mg by mouth daily.      lisinopril (ZESTRIL) 10 MG tablet Take 10 mg by mouth daily.      omeprazole (PRILOSEC) 20 MG capsule Take 20 mg by mouth at bedtime.       verapamil (VERELAN PM) 180 MG 24 hr capsule Take 180 mg by mouth  at bedtime.  11    zolpidem (AMBIEN) 10 MG tablet Take 10 mg by mouth at bedtime. Takes an additional 5 mg if needed.       Patient Stressors: Financial difficulties  Patient Strengths: Ability for insight Motivation for treatment/growth Supportive family/friends  Treatment Modalities: Medication Management, Group therapy, Case management,  1 to 1 session with clinician, Psychoeducation, Recreational therapy.   Physician Treatment Plan for Primary Diagnosis: MDD (major depressive disorder), recurrent severe, without psychosis ([place]) Long Term Goal(s): Improvement in symptoms so as ready for discharge   Short Term Goals: Ability to identify changes in lifestyle to reduce recurrence of condition will improve Ability to verbalize feelings will improve Ability to disclose and discuss suicidal ideas Ability to demonstrate self-control will improve Ability to identify and develop effective coping behaviors will improve Compliance with prescribed medications will  improve Ability to identify triggers associated with substance abuse/mental health issues will improve  Medication Management: Evaluate patient's response, side effects, and tolerance of medication regimen.  Therapeutic Interventions: 1 to 1 sessions, Unit Group sessions and Medication administration.  Evaluation of Outcomes: Adequate for Discharge  Physician Treatment Plan for Secondary Diagnosis: Principal Problem:   MDD (major depressive disorder), recurrent severe, without psychosis ([place]) Active Problems:   Generalized anxiety disorder  Long Term Goal(s): Improvement in symptoms so as ready for discharge   Short Term Goals: Ability to identify changes in lifestyle to reduce recurrence of condition will improve Ability to verbalize feelings will improve Ability to disclose and discuss suicidal ideas Ability to demonstrate self-control will improve Ability to identify and develop effective coping behaviors will improve Compliance with prescribed medications will improve Ability to identify triggers associated with substance abuse/mental health issues will improve     Medication Management: Evaluate patient's response, side effects, and tolerance of medication regimen.  Therapeutic Interventions: 1 to 1 sessions, Unit Group sessions and Medication administration.  Evaluation of Outcomes: Adequate for Discharge   RN Treatment Plan for Primary Diagnosis: MDD (major depressive disorder), recurrent severe, without psychosis ([place]) Long Term Goal(s): Knowledge of disease and therapeutic regimen to maintain health will improve  Short Term Goals: Ability to verbalize feelings will improve, Ability to identify and develop effective coping behaviors will improve, and Compliance with prescribed medications will improve  Medication Management: RN will administer medications as ordered by provider, will assess and evaluate patient's response and provide education to patient for prescribed  medication. RN will report any adverse and/or side effects to prescribing provider.  Therapeutic Interventions: 1 on 1 counseling sessions, Psychoeducation, Medication administration, Evaluate responses to treatment, Monitor vital signs and CBGs as ordered, Perform/monitor CIWA, COWS, AIMS and Fall Risk screenings as ordered, Perform wound care treatments as ordered.  Evaluation of Outcomes: Adequate for Discharge   LCSW Treatment Plan for Primary Diagnosis: MDD (major depressive disorder), recurrent severe, without psychosis ([place]) Long Term Goal(s): Safe transition to appropriate next level of care at discharge, Engage patient in therapeutic group addressing interpersonal concerns.  Short Term Goals: Engage patient in aftercare planning with referrals and resources, Facilitate acceptance of mental health diagnosis and concerns, and Identify triggers associated with mental health/substance abuse issues  Therapeutic Interventions: Assess for all discharge needs, 1 to 1 time with Social worker, Explore available resources and support systems, Assess for adequacy in community support network, Educate family and significant other(s) on suicide prevention, Complete Psychosocial Assessment, Interpersonal group therapy.  Evaluation of Outcomes: Adequate for Discharge   Progress in Treatment: Attending groups: Yes. Participating in  groups: Yes. Taking medication as prescribed: Yes. Toleration medication: Yes. Family/Significant other contact made: No, will contact:  husband Patient understands diagnosis: Yes. and No. Discussing patient identified problems/goals with staff: Yes. Medical problems stabilized or resolved: Yes. Denies suicidal/homicidal ideation: Yes. Issues/concerns per patient self-inventory: No. Other: None  New problem(s) identified: No, Describe:  None  New Short Term/Long Term Goal(s):medication stabilization, elimination of SI thoughts, development of comprehensive mental  wellness plan.   Patient Goals:  "to get to [facility] and get ECT"  Discharge Plan or Barriers: Patient recently admitted. CSW will continue to follow and assess for appropriate referrals and possible discharge planning.   Reason for Continuation of Hospitalization: Anxiety Depression Medication stabilization  Estimated Length of Stay: 3-5 days  Attendees: Patient: Alexis Wall 05/15/2021   Physician: Myles Lipps, MD 05/15/2021   Nursing:  05/15/2021   RN Care Manager: 05/15/2021   Social Worker: Verdis Frederickson, [place] 05/15/2021   Recreational Therapist:  05/15/2021   Other:  05/15/2021   Other:  05/15/2021   Other: 05/15/2021     Scribe for Treatment Team: [name], [place] 05/15/2021 10:45 AM

## 2021-05-15 NOTE — Progress Notes (Signed)
Recreation Therapy Notes  Date:  6.20.22 Time: 0930 Location: [address] Dayroom   Group Topic: Stress Management   Goal Area(s) Addresses: Patient will identify positive stress management techniques. Patient will identify benefits of using stress management post d/c.   Behavioral Response: Attentive   Intervention: Stress Management   Activity :  Meditation.  LRT played a meditation that focused on taking note of any tension that may be in the body or any sensations that may be felt such as cool, warmth, tingles or any slight sensations on the body.   Education:  Stress Management, Discharge Planning.   Education Outcome: Acknowledges Education   Clinical Observations/Feedback: Pt attended and participated.  Pt had no questions and expressed no concerns.       Victorino Sparrow, LRT/CTRS         Victorino Sparrow A 05/15/2021 12:38 PM

## 2021-05-15 NOTE — [facility] Group Notes (Signed)
Occupational Therapy Group Note Date: 05/15/2021 Group Topic/Focus: Self-Esteem  Group Description: Group encouraged increased engagement and participation through discussion and activity focused on self-esteem. Patients explored and discussed the differences between healthy and low self-esteem and how it affects our daily lives and occupations with a focus on relationships, work, school, self-care, and personal leisure interests. Group discussion then transitioned into identifying specific strategies to boost self-esteem and engaged in a collaborative and independent activity looking at positive thoughts and affirmations.   Therapeutic Goal(s): Understand and recognize the differences between healthy and low self-esteem Identify healthy strategies to improve/build self-esteem Participation Level: Active   Participation Quality: Independent   Behavior: Calm, Cooperative, and Interactive   Speech/Thought Process: Focused   Affect/Mood: Euthymic   Insight: Moderate   Judgement: Moderate   Individualization: Alexis Wall was active in their participation of group discussion/activity and identified one of her strengths "reading". Pt identified having difficulty managing low self-esteem, however shared one strategy that has worked here in the hospital is journaling and recognizing and pointing out her strengths.   Modes of Intervention: Activity, Discussion, Education, Socialization, and Support  Patient Response to Interventions:  Attentive, Engaged, Receptive, and Interested   Plan: Continue to engage patient in OT groups 2 - 3x/week.  05/15/2021  Ponciano Ort, MOT, OTR/L

## 2021-05-15 NOTE — [facility] Group Notes (Addendum)
ADULT GRIEF GROUP NOTE:   Spiritual care group on grief and loss facilitated by chaplain Janne Napoleon, [facility]   Group Goal:   Support / Education around grief and loss   Members engage in facilitated group support and psycho-social education.   Group Description:   Following introductions and group rules, group members engaged in facilitated group dialog and support around topic of loss, with particular support around experiences of loss in their lives. Group Identified types of loss (relationships / self / things) and identified patterns, circumstances, and changes that precipitate losses. Reflected on thoughts / feelings around loss, normalized grief responses, and recognized variety in grief experience. Group noted Worden's four tasks of grief in discussion.   Group drew on Adlerian / Rogerian, narrative, MI,   Patient Progress: Jackelyn Poling was an active participant in group and shared about her experiences with loss as well as her feelings about going home.    [place], [place] Pager, [phone] 12:38 PM

## 2021-05-15 NOTE — Discharge Summary (Signed)
Physician Discharge Summary Note  Patient:  Alexis Wall is an 55 y.o., female MRN:  [id number] DOB:  1966-07-21 Patient phone:  [phone] (home)  Patient address:   [address] [place] [address],  Total Time spent with patient: 30 minutes  Date of Admission:  05/05/2021 Date of Discharge: 05/15/2021  Reason for Admission:  (From MD's admission note): Alexis Wall is a 55 year old female with a history of major depressive disorder and anxiety who presented to [facility] ED on 05/04/21 following an overdose on Prozac, Ambien and Xanax in a suicide attempt.  There are conflicting numbers of tablets documented as having been taken in the overdose in the EMR.  The patient reported on assessment in the ED that she planned out the attempt and wrote suicide notes to family members.  She was transferred to [facility] for further treatment of depression.  On interview with me this morning, the patient is tired and a bit groggy from her recent benzodiazepine ingestion and is a suboptimal historian.  She presents with depressed and anxious mood congruent tearful affect and circumstantial thought processes.  She reports that she has had worsening anxiety since the end of March 2022 and then developed depression.  She states that she became tired of taking medications and not getting relief and that her depressive symptoms had worsened in the last 2 days "I felt like I had to had it."  Patient states that she took "a bottle of Xanax and a bottle of Ambien because I wanted all this pain, depression and anxiety to go away."  Reportedly she had just been seen by a psychiatrist at [facility] for consultation regarding her treatment and the possibility of receiving ECT at [facility]  During that consultation clonazepam which had recently been started for anxiety was changed back to alprazolam, her fluoxetine was increased and she was continued on her Ambien.  Most recent outpatient medication doses were fluoxetine 60 mg daily, alprazolam  0.5 mg 3 times daily, Ambien 10 to 15 mg at bedtime as needed for sleep and propranolol 3 times daily.  The patient states that clonazepam had recently been started at a dose of 1 twice daily but she felt she was becoming more depressed on it so she was changed back to alprazolam when she was seen at [place] 2 days ago.  The patient reports that her mood today is depressed and anxious.  She denies suicidal ideation and perseverates on wanting discharge.  He states that she feels she will always be depressed and she is concerned that her family is upset with her for being this way.  She denies any current symptoms of benzodiazepine withdrawal.  Patient states that her husband is currently working to facilitate transfer for patient to [facility] inpatient psychiatry where she can receive ECT.  Social work will obtain signature of the necessary consent to speak with husband and obtain additional information.  The patient reports prior diagnoses of major depression and generalized anxiety disorder.  She reports her first episode of depression was in 2008 and she was admitted at that time to the inpatient psychiatric hospital at [facility] and underwent ECT with improvement after 5 treatments.  She states that she did well until earlier in 2022 when the depression returned.  She denies any prior history of suicide attempts.   The patient reports a history of depression in her father and a history of anxiety in her mother.  She reports that her father committed suicide on May 21, 2019.  Dates she has a paternal cousin who overdosed on fentanyl in 2019 but she does not know whether this was an accidental overdose or suicide attempt.   She denies any history of alcohol or drug use.  Principal Problem: MDD (major depressive disorder), recurrent severe, without psychosis ([place]) Discharge Diagnoses: Principal Problem:   MDD (major depressive disorder), recurrent severe, without psychosis ([place]) Active Problems:   Generalized  anxiety disorder   Past Psychiatric History: See H&P  Past Medical History:  Past Medical History:  Diagnosis Date   Anemia    Arthritis    pains in knee   Bipolar disorder ([place])    per Dr D.Patterson's office note 07/08/2007   Depression    [facility]   Diabetes mellitus    GESTATIONAL-not now   GERD (gastroesophageal reflux disease)    Hypertension    Morbid obesity ([place])    Polyp of colon    Sleep apnea    Wears CPAP    Past Surgical History:  Procedure Laterality Date   ABDOMINAL HYSTERECTOMY  03/21/2010   TAH   BREATH TEK H PYLORI  08/05/2012   Procedure: BREATH TEK H PYLORI;  Surgeon: Pedro Earls, MD;  Location: [facility];  Service: General;  Laterality: N/A;  [place]  01/26/1999, 09/30/2002   X2.Marland Kitchen WITH BTSP   CHOLECYSTECTOMY N/A 08/27/2018   Procedure: LAPAROSCOPIC CHOLECYSTECTOMY WITH INTRAOPERATIVE CHOLANGIOGRAM;  Surgeon: Excell Seltzer, MD;  Location: [facility];  Service: General;  Laterality: N/A;   ENDOMETRIAL ABLATION  08/25/2009   HER OPTION    Family History:  Family History  Problem Relation Age of Onset   Hypertension Father    Obesity Father    Cancer Maternal Grandfather        colon   Sleep apnea Mother    Heart disease Paternal Grandfather    Colon cancer Neg Hx    Family Psychiatric  History: See H&P Social History:  Social History   Substance and Sexual Activity  Alcohol Use Yes   Alcohol/week: 1.0 standard drink   Types: 1 Glasses of wine per week   Comment: occasional glass of wine once per week     Social History   Substance and Sexual Activity  Drug Use No    Social History   Socioeconomic History   Marital status: Married    Spouse name: Not on file   Number of children: Not on file   Years of education: Not on file   Highest education level: Not on file  Occupational History   Not on file  Tobacco Use   Smoking status: Former    Pack years: 0.00    Types: Cigarettes    Quit date: 05/03/1979     Years since quitting: 42.0   Smokeless tobacco: Never  Vaping Use   Vaping Use: Never used  Substance and Sexual Activity   Alcohol use: Yes    Alcohol/week: 1.0 standard drink    Types: 1 Glasses of wine per week    Comment: occasional glass of wine once per week   Drug use: No   Sexual activity: Yes    Comment: TAH  Other Topics Concern   Not on file  Social History Narrative   Not on file   Social Determinants of Health   Financial Resource Strain: Not on file  Food Insecurity: Not on file  Transportation Needs: Not on file  Physical Activity: Not on file  Stress: Not on file  Social  Connections: Not on file    Hospital Course:  After the above admission evaluation, Alexis Wall's presenting symptoms were noted. She was recommended for mood stabilization treatments. The medication regimen targeting those presenting symptoms were discussed with him/her & initiated with her consent. Her home medications were restarted. Her Prozac was increased to better target her depression. She is attempting to have ECT at [facility] after discharge. She did receive ECT in 2008 for her refractory depression. She has a follow up appointment with [facility] on 6/22. Her UDS on arrival to the ED was positive for benzos, BAL negative. She was however medicated, stabilized & discharged on the medications as listed on her discharge medication list below. Besides the mood stabilization treatments, Alexis Wall was also enrolled & participated in the group counseling sessions being offered & held on this unit. She learned coping skills. She presented no other significant pre-existing medical issues that required treatment. She tolerated her treatment regimen without any adverse effects or reactions reported.   During the course of her hospitalization, the 15-minute checks were adequate to ensure patient's safety. Alexis Wall did not display any dangerous, violent or suicidal behavior on the unit. She interacted with  patients & staff appropriately, participated appropriately in the group sessions/therapies. Her medications were addressed & adjusted to meet her needs. She was recommended for outpatient follow-up care & medication management upon discharge to assure continuity of care & mood stability.  At the time of discharge patient is not reporting any acute suicidal/homicidal ideations. She feels more confident about her self-care & in managing his mental health. She currently denies any new issues or concerns. Education and supportive counseling provided throughout her hospital stay & upon discharge.   Today upon her discharge evaluation with the attending psychiatrist, Alexis Wall shares she is doing well. She denies any other specific concerns. She is sleeping well. Her appetite is good. She denies other physical complaints. She denies AH/VH, delusional thoughts or paranoia. She does not appear to be responding to any internal stimuli. She feels that her medications have been helpful & is in agreement to continue her current treatment regimen as recommended. She was able to engage in safety planning including plan to return to [facility] or contact emergency services if she feels unable to maintain her own safety or the safety of others. Pt had no further questions, comments, or concerns. She left [facility] with all personal belongings in no apparent distress. Transportation per private vehicle with her husband.    Physical Findings: AIMS: Facial and Oral Movements Muscles of Facial Expression: None, normal Lips and Perioral Area: None, normal Jaw: None, normal Tongue: None, normal,Extremity Movements Upper (arms, wrists, hands, fingers): None, normal Lower (legs, knees, ankles, toes): None, normal, Trunk Movements Neck, shoulders, hips: None, normal, Overall Severity Severity of abnormal movements (highest score from questions above): None, normal Incapacitation due to abnormal movements: None, normal Patient's awareness of  abnormal movements (rate only patient's report): No Awareness, Dental Status Current problems with teeth and/or dentures?: No Does patient usually wear dentures?: No  CIWA:  CIWA-Ar Total: 2 COWS:     Musculoskeletal: Strength & Muscle Tone: within normal limits Gait & Station: normal Patient leans: N/A  Psychiatric Specialty Exam:  Presentation  General Appearance: Appropriate for Environment; Neat  Eye Contact:Good  Speech:Clear and Coherent; Normal Rate  Speech Volume:Normal  Handedness:Left  Mood and Affect  Mood:Anxious; Depressed ("better" Less depressed, less anxious)  Affect:Appropriate; Full Range  Thought Process  Thought Processes:Coherent; Goal Directed  Descriptions of  Associations:Intact  Orientation:Full (Time, Place and Person)  Thought Content:Logical  History of Schizophrenia/Schizoaffective disorder:No  Duration of Psychotic Symptoms:N/A  Hallucinations:Hallucinations: None  Ideas of Reference:None  Suicidal Thoughts:Suicidal Thoughts: No  Homicidal Thoughts:Homicidal Thoughts: No  Sensorium  Memory:Immediate Good; Recent Good; Remote Good  Judgment:Good  Insight:Good  Executive Functions  Concentration:Good  Attention Span:Good  [place] of Knowledge:Good  Language:Good  Psychomotor Activity  Psychomotor Activity:Psychomotor Activity: Normal  Assets  Assets:Communication Skills; Desire for Improvement; Financial Resources/Insurance; Housing; Physical Health; Resilience; Social Support; Transportation; Vocational/Educational  Sleep  Sleep:Sleep: Fair Number of Hours of Sleep: 5.25  Physical Exam: Physical Exam Constitutional:      Appearance: Normal appearance.  HENT:     Head: Normocephalic.  Pulmonary:     Effort: Pulmonary effort is normal.  Musculoskeletal:        General: Normal range of motion.     Cervical back: Normal range of motion.  Neurological:     General: No focal deficit present.      Mental Status: She is alert and oriented to person, place, and time.   ROS Blood pressure 131/74, pulse 63, temperature 97.7 F (36.5 C), temperature source Oral, resp. rate 16, height 5\' 4"  (1.626 m), weight 101.6 kg, SpO2 98 %. Body mass index is 38.45 kg/m.   Social History   Tobacco Use  Smoking Status Former   Pack years: 0.00   Types: Cigarettes   Quit date: 05/03/1979   Years since quitting: 42.0  Smokeless Tobacco Never   Tobacco Cessation:  N/A, patient does not currently use tobacco products   Blood Alcohol level:  No results found for: [facility]  Metabolic Disorder Labs:  Lab Results  Component Value Date   HGBA1C 6.0 (H) 05/04/2021   MPG 126 05/04/2021   MPG 131 08/25/2018   No results found for: PROLACTIN Lab Results  Component Value Date   CHOL 118 05/04/2021   TRIG 109 05/04/2021   HDL 39 (L) 05/04/2021   CHOLHDL 3.0 05/04/2021   VLDL 22 05/04/2021   LDLCALC 57 05/04/2021    See Psychiatric Specialty Exam and Suicide Risk Assessment completed by Attending Physician prior to discharge.  Discharge destination:  Home  Is patient on multiple antipsychotic therapies at discharge:  No   Has Patient had three or more failed trials of antipsychotic monotherapy by history:  No  Recommended Plan for Multiple Antipsychotic Therapies: NA  Discharge Instructions     Diet - low sodium heart healthy   Complete by: As directed    Increase activity slowly   Complete by: As directed       Allergies as of 05/15/2021       Reactions   Penicillins Rash   Happened in childhood, does not remember if it spread all over her body or not. Has patient had a PCN reaction causing immediate rash, facial/tongue/throat swelling, SOB or lightheadedness with hypotension: no Has patient had a PCN reaction causing severe rash involving mucus membranes or skin necrosis: no Has patient had a PCN reaction that required hospitalization no Has patient had a PCN reaction occurring  within the last 10 years: no If all of the above answers are "NO", then may proceed with C        Medication List     STOP taking these medications    clonazePAM 1 MG tablet Commonly known as: KLONOPIN   propranolol 10 MG tablet Commonly known as: INDERAL   verapamil 180 MG 24 hr capsule Commonly  known as: VERELAN PM Replaced by: verapamil 180 MG CR tablet       TAKE these medications      Indication  ALPRAZolam 0.5 MG tablet Commonly known as: XANAX Take 1 tablet (0.5 mg total) by mouth 3 (three) times daily as needed for anxiety. What changed:  when to take this reasons to take this  Indication: Feeling Anxious   buPROPion 150 MG 24 hr tablet Commonly known as: WELLBUTRIN XL Take 1 tablet (150 mg total) by mouth daily. Start taking on: May 16, 2021  Indication: Major Depressive Disorder   FLUoxetine 40 MG capsule Commonly known as: PROZAC Take 1 capsule (40 mg total) by mouth daily. Start taking on: May 16, 2021 What changed:  medication strength how much to take  Indication: Depression   lisinopril 10 MG tablet Commonly known as: ZESTRIL Take 1 tablet (10 mg total) by mouth daily.  Indication: High Blood Pressure Disorder   omeprazole 20 MG capsule Commonly known as: PRILOSEC Take 20 mg by mouth at bedtime.  Indication: Gastroesophageal Reflux Disease   ramelteon 8 MG tablet Commonly known as: ROZEREM Take 1 tablet (8 mg total) by mouth at bedtime.  Indication: Trouble Sleeping   thiamine 100 MG tablet Take 1 tablet (100 mg total) by mouth daily. Start taking on: May 16, 2021  Indication: daily supplement   traZODone 100 MG tablet Commonly known as: DESYREL Take 1 tablet (100 mg total) by mouth at bedtime.  Indication: Trouble Sleeping   verapamil 180 MG CR tablet Commonly known as: CALAN-SR Take 1 tablet (180 mg total) by mouth at bedtime. Replaces: verapamil 180 MG 24 hr capsule  Indication: High Blood Pressure Disorder    zolpidem 10 MG tablet Commonly known as: AMBIEN Take 1 tablet (10 mg total) by mouth at bedtime. What changed: additional instructions  Indication: [place] Follow up on 06/05/2021.   Specialty: Behavioral Health Why: 06/05/21 at 11:00 am for therapy services, Virtual via webex Contact information: [place] Follow up on 05/18/2021.   Why: You have a medication management appointment scheduled for 05/18/21 at 10:30am. Contact information: Address: [place], [place], [place]  Phone: [phone]        [facility] Follow up on 05/17/2021.   Why: You are scheduled to see Dr. Jonne Ply for an ECT consultation on 05/17/21.  Please contact this provider for further instructions prior to appointment. Contact information: Address: [address] [place], [place], [place] [address]  Phone: [phone] Fax: [phone]        [facility] Follow up on 05/23/2021.   Specialty: Behavioral Health Why: You have an appointment on 05/23/21 at 2:00 pm for therapy and medication management services in the partial hospitalization program. This will be a  Virtual appointment. Contact information: [place] [id number] [place] [phone]                Follow-up recommendations:  Activity:  As tolerated Diet:  Heart healthy  Comments:  Prescriptions were given at discharge.  Patient is agreeable with discharge plan.  She was given opportunity to ask questions.  She appears to feel comfortable with discharge and denies any current suicidal or homicidal thoughts.   Patient  is instructed prior to discharge to: Take all medications as prescribed by her mental healthcare provider. Report any adverse effects and or reactions  from the medicines to her outpatient provider promptly. Patient has been instructed & cautioned: To not engage in alcohol and or illegal drug use while on prescription medicines. In the event of worsening symptoms, patient is instructed to call the crisis hotline, 911 and or go to the nearest ED for appropriate evaluation and treatment of symptoms. To follow-up with her primary care provider for your other medical issues, concerns and or health care needs.   Signed: Ethelene Hal, NP 05/15/2021, 11:03 AM

## 2021-05-15 NOTE — BHH Counselor (Signed)
CSW spoke with Mr. Alexis Wall [phone] who states that he is ready for his wife to come back home and that he will be available to pick his wife up for discharge at 3:00 pm today.  He states that he has thrown away all old pills in the home and has locked up all of the medications that he uses daily in a secure lock box.  Mr. Alexis Wall states that he has no further questions or concerns.  He would like his wife's medications to go to the [facility] on [address] in [place] and wanted to make sure she had a list of appointments and instructions.  CSW completed the phone call with Alexis Wall.

## 2021-05-15 NOTE — Progress Notes (Signed)
  [facility] Adult Case Management Discharge Plan :  Will you be returning to the same living situation after discharge:  Yes,  Home  At discharge, do you have transportation home?: Yes,  Husband  Do you have the ability to pay for your medications: Yes,  Insurance   Release of information consent forms completed and in the chart;  Patient's signature needed at discharge.  Patient to Follow up at:  Follow-up Information     [facility] Follow up on 06/05/2021.   Specialty: Behavioral Health Why: 06/05/21 at 11:00 am for therapy services, Virtual via webex Contact information: [place] Follow up on 05/18/2021.   Why: You have a medication management appointment scheduled for 05/18/21 at 10:30am. Contact information: Address: [place] [address], [place], [place]  Phone: [phone]        [facility] Follow up on 05/17/2021.   Why: You are scheduled to see Dr. Jonne Ply for an ECT consultation on 05/17/21.  Please contact this provider for further instructions prior to appointment. Contact information: Address: [place], [place], [place] [address]  Phone: [phone] Fax: [phone]        [place] Follow up on 05/23/2021.   Specialty: Behavioral Health Why: You have an appointment on 05/23/21 at 2:00 pm for therapy and medication management services in the partial hospitalization program. This will be a  Virtual appointment. Contact information: [place] [id number] [place] [phone]                Next level of care provider has access to [place] and Suicide Prevention discussed: Yes,  with patient and husband   Have you used any form of tobacco in the last 30 days? (Cigarettes, Smokeless Tobacco, Cigars, and/or Pipes):  No  Has patient been referred to the Quitline?: Patient refused referral  Patient has been referred for addiction treatment: [place], [place] 05/15/2021, 10:27 AM

## 2021-05-15 NOTE — [facility] Suicide Risk Assessment (Signed)
[facility] Discharge Suicide Risk Assessment   Principal Problem: MDD (major depressive disorder), recurrent severe, without psychosis ([place]) Discharge Diagnoses: Principal Problem:   MDD (major depressive disorder), recurrent severe, without psychosis ([place]) Active Problems:   Generalized anxiety disorder   Total Time spent with patient: 20 minutes  Musculoskeletal: Strength & Muscle Tone: within normal limits Gait & Station: normal Patient leans: N/A  Psychiatric Specialty Exam  Presentation  General Appearance: Appropriate for Environment; Neat  Eye Contact:Good  Speech:Clear and Coherent; Normal Rate  Speech Volume:Normal  Handedness:Left   Mood and Affect  Mood:Anxious; Depressed ("better" Less depressed, less anxious)  Duration of Depression Symptoms: Greater than two weeks  Affect:Appropriate; Full Range   Thought Process  Thought Processes:Coherent; Goal Directed  Descriptions of Associations:Intact  Orientation:Full (Time, Place and Person)  Thought Content:Logical  History of Schizophrenia/Schizoaffective disorder:No  Duration of Psychotic Symptoms:N/A  Hallucinations:Hallucinations: None  Ideas of Reference:None  Suicidal Thoughts:Suicidal Thoughts: No  Homicidal Thoughts:Homicidal Thoughts: No   Sensorium  Memory:Immediate Good; Recent Good; Remote Good  Judgment:Good  Insight:Good   Executive Functions  Concentration:Good  Attention Span:Good  Rafael Hernandez of Knowledge:Good  Language:Good   Psychomotor Activity  Psychomotor Activity:Psychomotor Activity: Normal   Assets  Assets:Communication Skills; Desire for Improvement; Financial Resources/Insurance; Housing; Physical Health; Resilience; Social Support; Transportation; Vocational/Educational   Sleep  Sleep:Sleep: Fair Number of Hours of Sleep: 5.25   Physical Exam: Physical Exam Vitals and nursing note reviewed.  Constitutional:      Appearance: Normal  appearance.  HENT:     Head: Normocephalic and atraumatic.  Pulmonary:     Effort: Pulmonary effort is normal.  Neurological:     General: No focal deficit present.     Mental Status: She is alert and oriented to person, place, and time.   Review of Systems  Constitutional: Negative.   Respiratory: Negative.    Cardiovascular: Negative.   Gastrointestinal: Negative.   Neurological: Negative.   Psychiatric/Behavioral:  Negative for hallucinations and suicidal ideas.    Vital signs on 05/15/2021 at 7:24 AM: BP 122/70 sitting and 107/52 standing, pulse 70 sitting and 72 standing, respirations 16, O2 sat 98% on room air, temperature 98.3. Blood pressure (!) 107/52, pulse 72, temperature 98.3 F (36.8 C), temperature source Oral, resp. rate 16, height 5\' 4"  (1.626 m), weight 101.6 kg, SpO2 98 %. Body mass index is 38.45 kg/m.  Mental Status Per Nursing Assessment::   On Admission:  Suicidal ideation indicated by patient  Demographic Factors:  Caucasian  Loss Factors: NA  Historical Factors: Prior suicide attempts, Family history of suicide, Family history of mental illness or substance abuse, Anniversary of important loss, and Impulsivity  Risk Reduction Factors:   Responsible for children under 72 years of age, Sense of responsibility to family, Living with another person, especially a relative, and Positive social support  Continued Clinical Symptoms:  Anxiety - improved Depression - improved Previous Psychiatric Diagnoses and Treatments  Cognitive Features That Contribute To Risk:  None    Suicide Risk:  Mild:  Suicidal ideation of limited frequency, intensity, duration, and specificity.  There are no identifiable plans, no associated intent, mild dysphoria and related symptoms, good self-control (both objective and subjective assessment), few other risk factors, and identifiable protective factors, including available and accessible social support.   Follow-up  Information     [facility] Follow up on 06/05/2021.   Specialty: Behavioral Health Why: 06/05/21 at 11:00 am for therapy services, Virtual via webex Contact  information: [facility] Follow up on 05/18/2021.   Why: You have a medication management appointment scheduled for 05/18/21 at 10:30am. Contact information: Address: [address], [place], [place]  Phone: [phone]        [facility] Follow up on 05/17/2021.   Why: You are scheduled to see Dr. Jonne Ply for an ECT consultation on 05/17/21.  Please contact this provider for further instructions prior to appointment. Contact information: Address: [address], [place], [place] [address]  Phone: [phone] Fax: [phone]        [facility] Follow up on 05/23/2021.   Specialty: Behavioral Health Why: You have an appointment on 05/23/21 at 2:00 pm for therapy and medication management services in the partial hospitalization program. This will be a  Virtual appointment. Contact information: [place] [id number] [place] [address] [place], Madie Reno, MD. Call.   Specialty: Psychiatry Why: Please contact this provider to inquire about Outpatient ECT services. Contact information: [place] [address] [phone]                 Plan Of Care/Follow-up recommendations:  Activity:  As tolerated  Other:   -Take medications as prescribed.  -Do not drink alcohol.  Do not use marijuana or other drugs.   -Keep outpatient mental health follow-up appointments with ECT consultant, therapist, outpatient psychiatrist and partial hospital program.   -See your primary care provider for treatment of medical conditions.  Arthor Captain, MD 05/15/2021, 8:55 AM

## 2021-05-15 NOTE — [facility] Group Notes (Signed)
LCSW Group Therapy Note  05/15/2021   Type of Therapy and Topic:  Group Therapy - Healthy vs Unhealthy Coping Skills  Participation Level:  Active  Description of Group The focus of this group was to determine what unhealthy coping techniques typically are used by group members and what healthy coping techniques would be helpful in coping with various problems. Patients were guided in becoming aware of the differences between healthy and unhealthy coping techniques. Patients were asked to identify 2-3 healthy coping skills they would like to learn to use more effectively.  Therapeutic Goals 1. Patients learned that coping is what human beings do all day long to deal with various situations in their lives 2. Patients defined and discussed healthy vs unhealthy coping techniques 3. Patients identified their preferred coping techniques and identified whether these were healthy or unhealthy 4. Patients determined 2-3 healthy coping skills they would like to become more familiar with and use more often. 5. Patients provided support and ideas to each other   Summary of Patient Progress:  During group, pt expressed that she enjoys reading as a coping skill. Patient proved open to input from peers and feedback from [place]. Patient demonstrate insight into the subject matter, was respectful of peers, and participated throughout the entire session.   Therapeutic Modalities Cognitive Behavioral Therapy Motivational Interviewing  Eliott Nine 05/15/2021  2:02 PM

## 2021-05-17 ENCOUNTER — Ambulatory Visit: Payer: BC Managed Care – PPO | Admitting: Adult Health

## 2021-05-17 DIAGNOSIS — F339 Major depressive disorder, recurrent, unspecified: Secondary | ICD-10-CM | POA: Diagnosis not present

## 2021-05-17 DIAGNOSIS — F332 Major depressive disorder, recurrent severe without psychotic features: Secondary | ICD-10-CM | POA: Diagnosis not present

## 2021-05-18 DIAGNOSIS — F332 Major depressive disorder, recurrent severe without psychotic features: Secondary | ICD-10-CM | POA: Diagnosis not present

## 2021-05-18 DIAGNOSIS — F410 Panic disorder [episodic paroxysmal anxiety] without agoraphobia: Secondary | ICD-10-CM | POA: Diagnosis not present

## 2021-05-18 DIAGNOSIS — F411 Generalized anxiety disorder: Secondary | ICD-10-CM | POA: Diagnosis not present

## 2021-05-22 ENCOUNTER — Telehealth (HOSPITAL_COMMUNITY): Payer: Self-pay | Admitting: Licensed Clinical Social Worker

## 2021-05-23 ENCOUNTER — Other Ambulatory Visit: Payer: Self-pay

## 2021-05-23 ENCOUNTER — Other Ambulatory Visit (HOSPITAL_COMMUNITY): Payer: BC Managed Care – PPO

## 2021-05-24 DIAGNOSIS — F3341 Major depressive disorder, recurrent, in partial remission: Secondary | ICD-10-CM | POA: Diagnosis not present

## 2021-05-24 DIAGNOSIS — I10 Essential (primary) hypertension: Secondary | ICD-10-CM | POA: Diagnosis not present

## 2021-05-25 DIAGNOSIS — F332 Major depressive disorder, recurrent severe without psychotic features: Secondary | ICD-10-CM | POA: Diagnosis not present

## 2021-05-25 DIAGNOSIS — F410 Panic disorder [episodic paroxysmal anxiety] without agoraphobia: Secondary | ICD-10-CM | POA: Diagnosis not present

## 2021-05-25 DIAGNOSIS — F411 Generalized anxiety disorder: Secondary | ICD-10-CM | POA: Diagnosis not present

## 2021-05-31 DIAGNOSIS — I10 Essential (primary) hypertension: Secondary | ICD-10-CM | POA: Diagnosis not present

## 2021-05-31 DIAGNOSIS — R9439 Abnormal result of other cardiovascular function study: Secondary | ICD-10-CM | POA: Diagnosis not present

## 2021-05-31 DIAGNOSIS — Z01818 Encounter for other preprocedural examination: Secondary | ICD-10-CM | POA: Diagnosis not present

## 2021-05-31 DIAGNOSIS — G4730 Sleep apnea, unspecified: Secondary | ICD-10-CM | POA: Diagnosis not present

## 2021-05-31 DIAGNOSIS — K219 Gastro-esophageal reflux disease without esophagitis: Secondary | ICD-10-CM | POA: Diagnosis not present

## 2021-05-31 DIAGNOSIS — J90 Pleural effusion, not elsewhere classified: Secondary | ICD-10-CM | POA: Diagnosis not present

## 2021-05-31 DIAGNOSIS — F319 Bipolar disorder, unspecified: Secondary | ICD-10-CM | POA: Diagnosis not present

## 2021-05-31 DIAGNOSIS — R0789 Other chest pain: Secondary | ICD-10-CM | POA: Diagnosis not present

## 2021-05-31 DIAGNOSIS — F411 Generalized anxiety disorder: Secondary | ICD-10-CM | POA: Diagnosis not present

## 2021-05-31 DIAGNOSIS — F332 Major depressive disorder, recurrent severe without psychotic features: Secondary | ICD-10-CM | POA: Diagnosis not present

## 2021-06-01 DIAGNOSIS — R0789 Other chest pain: Secondary | ICD-10-CM | POA: Diagnosis not present

## 2021-06-01 DIAGNOSIS — Z01818 Encounter for other preprocedural examination: Secondary | ICD-10-CM | POA: Diagnosis not present

## 2021-06-02 DIAGNOSIS — F332 Major depressive disorder, recurrent severe without psychotic features: Secondary | ICD-10-CM | POA: Diagnosis not present

## 2021-06-05 ENCOUNTER — Ambulatory Visit: Payer: BC Managed Care – PPO | Admitting: Psychology

## 2021-06-05 DIAGNOSIS — J90 Pleural effusion, not elsewhere classified: Secondary | ICD-10-CM | POA: Diagnosis not present

## 2021-06-05 DIAGNOSIS — Z01818 Encounter for other preprocedural examination: Secondary | ICD-10-CM | POA: Diagnosis not present

## 2021-06-06 DIAGNOSIS — I10 Essential (primary) hypertension: Secondary | ICD-10-CM | POA: Diagnosis not present

## 2021-06-06 DIAGNOSIS — R9439 Abnormal result of other cardiovascular function study: Secondary | ICD-10-CM | POA: Diagnosis not present

## 2021-06-07 DIAGNOSIS — F332 Major depressive disorder, recurrent severe without psychotic features: Secondary | ICD-10-CM | POA: Diagnosis not present

## 2021-06-09 DIAGNOSIS — R404 Transient alteration of awareness: Secondary | ICD-10-CM | POA: Diagnosis not present

## 2021-06-20 ENCOUNTER — Ambulatory Visit (HOSPITAL_COMMUNITY): Payer: BC Managed Care – PPO | Admitting: Licensed Clinical Social Worker

## 2021-06-26 DIAGNOSIS — 419620001 Death: Secondary | SNOMED CT | POA: Diagnosis not present

## 2021-06-26 DEATH — deceased

## 2021-06-27 ENCOUNTER — Ambulatory Visit: Payer: BC Managed Care – PPO | Admitting: Psychology

## 2021-07-10 ENCOUNTER — Ambulatory Visit: Payer: BC Managed Care – PPO | Admitting: Neurology

## 2021-07-11 ENCOUNTER — Encounter: Payer: Self-pay | Admitting: Neurology

## 2021-07-18 ENCOUNTER — Ambulatory Visit: Payer: Self-pay | Admitting: Psychology

## 2021-07-18 ENCOUNTER — Other Ambulatory Visit: Payer: Self-pay

## 2021-07-18 ENCOUNTER — Telehealth: Payer: Self-pay | Admitting: Neurology

## 2021-07-18 NOTE — Telephone Encounter (Signed)
Will send sympathy card

## 2021-07-18 NOTE — Telephone Encounter (Signed)
Pt's husband, Sharlin Bek called, Ms Murner passed away on 06/20/2021
